# Patient Record
Sex: Female | Born: 1950 | Race: White | Hispanic: No | Smoking: Former smoker
Health system: Southern US, Community
[De-identification: ages and names within clinical notes are randomized; demographics above are authoritative.]

## PROBLEM LIST (undated history)

## (undated) DIAGNOSIS — M720 Palmar fascial fibromatosis [Dupuytren]: Secondary | ICD-10-CM

## (undated) DIAGNOSIS — F329 Major depressive disorder, single episode, unspecified: Secondary | ICD-10-CM

## (undated) DIAGNOSIS — G4700 Insomnia, unspecified: Secondary | ICD-10-CM

## (undated) DIAGNOSIS — M810 Age-related osteoporosis without current pathological fracture: Secondary | ICD-10-CM

## (undated) DIAGNOSIS — R55 Syncope and collapse: Secondary | ICD-10-CM

## (undated) DIAGNOSIS — J301 Allergic rhinitis due to pollen: Secondary | ICD-10-CM

## (undated) DIAGNOSIS — R42 Dizziness and giddiness: Secondary | ICD-10-CM

## (undated) DIAGNOSIS — T4145XA Adverse effect of unspecified anesthetic, initial encounter: Secondary | ICD-10-CM

## (undated) DIAGNOSIS — K219 Gastro-esophageal reflux disease without esophagitis: Secondary | ICD-10-CM

## (undated) DIAGNOSIS — D126 Benign neoplasm of colon, unspecified: Secondary | ICD-10-CM

## (undated) DIAGNOSIS — R002 Palpitations: Secondary | ICD-10-CM

## (undated) DIAGNOSIS — F32A Depression, unspecified: Secondary | ICD-10-CM

## (undated) DIAGNOSIS — M79609 Pain in unspecified limb: Secondary | ICD-10-CM

## (undated) DIAGNOSIS — E559 Vitamin D deficiency, unspecified: Secondary | ICD-10-CM

## (undated) DIAGNOSIS — M8580 Other specified disorders of bone density and structure, unspecified site: Secondary | ICD-10-CM

## (undated) DIAGNOSIS — F419 Anxiety disorder, unspecified: Secondary | ICD-10-CM

## (undated) DIAGNOSIS — R5383 Other fatigue: Secondary | ICD-10-CM

## (undated) DIAGNOSIS — F411 Generalized anxiety disorder: Secondary | ICD-10-CM

## (undated) DIAGNOSIS — T8859XA Other complications of anesthesia, initial encounter: Secondary | ICD-10-CM

## (undated) DIAGNOSIS — I499 Cardiac arrhythmia, unspecified: Secondary | ICD-10-CM

## (undated) DIAGNOSIS — M1990 Unspecified osteoarthritis, unspecified site: Secondary | ICD-10-CM

## (undated) DIAGNOSIS — R51 Headache: Secondary | ICD-10-CM

## (undated) DIAGNOSIS — R112 Nausea with vomiting, unspecified: Secondary | ICD-10-CM

## (undated) DIAGNOSIS — C439 Malignant melanoma of skin, unspecified: Secondary | ICD-10-CM

## (undated) DIAGNOSIS — Z853 Personal history of malignant neoplasm of breast: Secondary | ICD-10-CM

## (undated) DIAGNOSIS — I959 Hypotension, unspecified: Secondary | ICD-10-CM

## (undated) DIAGNOSIS — S0011XA Contusion of right eyelid and periocular area, initial encounter: Secondary | ICD-10-CM

## (undated) DIAGNOSIS — Z9889 Other specified postprocedural states: Secondary | ICD-10-CM

## (undated) HISTORY — DX: Benign neoplasm of colon, unspecified: D12.6

## (undated) HISTORY — DX: Pain in unspecified limb: M79.609

## (undated) HISTORY — PX: BREAST EXCISIONAL BIOPSY: SUR124

## (undated) HISTORY — DX: Personal history of malignant neoplasm of breast: Z85.3

## (undated) HISTORY — PX: TOTAL ABDOMINAL HYSTERECTOMY W/ BILATERAL SALPINGOOPHORECTOMY: SHX83

## (undated) HISTORY — DX: Age-related osteoporosis without current pathological fracture: M81.0

## (undated) HISTORY — PX: BREAST BIOPSY: SHX20

## (undated) HISTORY — DX: Vitamin D deficiency, unspecified: E55.9

## (undated) HISTORY — DX: Unspecified osteoarthritis, unspecified site: M19.90

## (undated) HISTORY — DX: Palpitations: R00.2

## (undated) HISTORY — DX: Other fatigue: R53.83

## (undated) HISTORY — PX: CERVICAL LAMINECTOMY: SHX94

## (undated) HISTORY — DX: Insomnia, unspecified: G47.00

## (undated) HISTORY — DX: Palmar fascial fibromatosis (dupuytren): M72.0

## (undated) HISTORY — DX: Allergic rhinitis due to pollen: J30.1

## (undated) HISTORY — DX: Contusion of right eyelid and periocular area, initial encounter: S00.11XA

## (undated) HISTORY — PX: OTHER SURGICAL HISTORY: SHX169

## (undated) HISTORY — DX: Major depressive disorder, single episode, unspecified: F32.9

## (undated) HISTORY — DX: Dizziness and giddiness: R42

## (undated) HISTORY — DX: Generalized anxiety disorder: F41.1

## (undated) HISTORY — DX: Malignant melanoma of skin, unspecified: C43.9

## (undated) HISTORY — DX: Other specified disorders of bone density and structure, unspecified site: M85.80

## (undated) HISTORY — PX: TONSILLECTOMY: SUR1361

---

## 2001-07-05 ENCOUNTER — Ambulatory Visit (HOSPITAL_COMMUNITY): Admission: RE | Admit: 2001-07-05 | Discharge: 2001-07-05 | Payer: Self-pay | Admitting: Pulmonary Disease

## 2001-07-12 ENCOUNTER — Ambulatory Visit (HOSPITAL_COMMUNITY): Admission: RE | Admit: 2001-07-12 | Discharge: 2001-07-12 | Payer: Self-pay | Admitting: Pulmonary Disease

## 2002-03-31 ENCOUNTER — Ambulatory Visit (HOSPITAL_COMMUNITY): Admission: RE | Admit: 2002-03-31 | Discharge: 2002-03-31 | Payer: Self-pay | Admitting: Pulmonary Disease

## 2002-10-22 ENCOUNTER — Inpatient Hospital Stay (HOSPITAL_COMMUNITY): Admission: EM | Admit: 2002-10-22 | Discharge: 2002-10-24 | Payer: Self-pay

## 2002-10-22 ENCOUNTER — Encounter: Payer: Self-pay | Admitting: Neurosurgery

## 2002-10-23 ENCOUNTER — Encounter: Payer: Self-pay | Admitting: Neurosurgery

## 2003-08-28 ENCOUNTER — Ambulatory Visit (HOSPITAL_COMMUNITY): Admission: RE | Admit: 2003-08-28 | Discharge: 2003-08-28 | Payer: Self-pay | Admitting: Obstetrics & Gynecology

## 2004-12-05 ENCOUNTER — Encounter: Admission: RE | Admit: 2004-12-05 | Discharge: 2004-12-05 | Payer: Self-pay | Admitting: Pulmonary Disease

## 2006-01-09 ENCOUNTER — Encounter: Admission: RE | Admit: 2006-01-09 | Discharge: 2006-01-09 | Payer: Self-pay | Admitting: Pulmonary Disease

## 2006-01-27 ENCOUNTER — Encounter: Admission: RE | Admit: 2006-01-27 | Discharge: 2006-01-27 | Payer: Self-pay | Admitting: Pulmonary Disease

## 2006-05-06 ENCOUNTER — Ambulatory Visit (HOSPITAL_COMMUNITY): Admission: RE | Admit: 2006-05-06 | Discharge: 2006-05-06 | Payer: Self-pay | Admitting: Pulmonary Disease

## 2006-11-20 ENCOUNTER — Ambulatory Visit (HOSPITAL_COMMUNITY): Admission: RE | Admit: 2006-11-20 | Discharge: 2006-11-21 | Payer: Self-pay | Admitting: Neurosurgery

## 2007-02-04 ENCOUNTER — Encounter: Admission: RE | Admit: 2007-02-04 | Discharge: 2007-02-04 | Payer: Self-pay | Admitting: Obstetrics and Gynecology

## 2007-05-07 ENCOUNTER — Ambulatory Visit: Payer: Self-pay | Admitting: Gastroenterology

## 2007-05-07 ENCOUNTER — Encounter: Payer: Self-pay | Admitting: Gastroenterology

## 2007-05-07 ENCOUNTER — Ambulatory Visit (HOSPITAL_COMMUNITY): Admission: RE | Admit: 2007-05-07 | Discharge: 2007-05-07 | Payer: Self-pay | Admitting: Gastroenterology

## 2007-10-25 ENCOUNTER — Emergency Department (HOSPITAL_COMMUNITY): Admission: EM | Admit: 2007-10-25 | Discharge: 2007-10-25 | Payer: Self-pay | Admitting: Emergency Medicine

## 2008-03-17 ENCOUNTER — Encounter: Admission: RE | Admit: 2008-03-17 | Discharge: 2008-03-17 | Payer: Self-pay | Admitting: Obstetrics and Gynecology

## 2008-04-19 IMAGING — CR DG WRIST COMPLETE 3+V*L*
3 series · 3 of 3 positions shown · non-contrast
Comparison: none

CLINICAL DATA: Status post fall with pain in wrist.
 LEFT WRIST ? 4 VIEWS:

[view not recorded (1 of 3)]
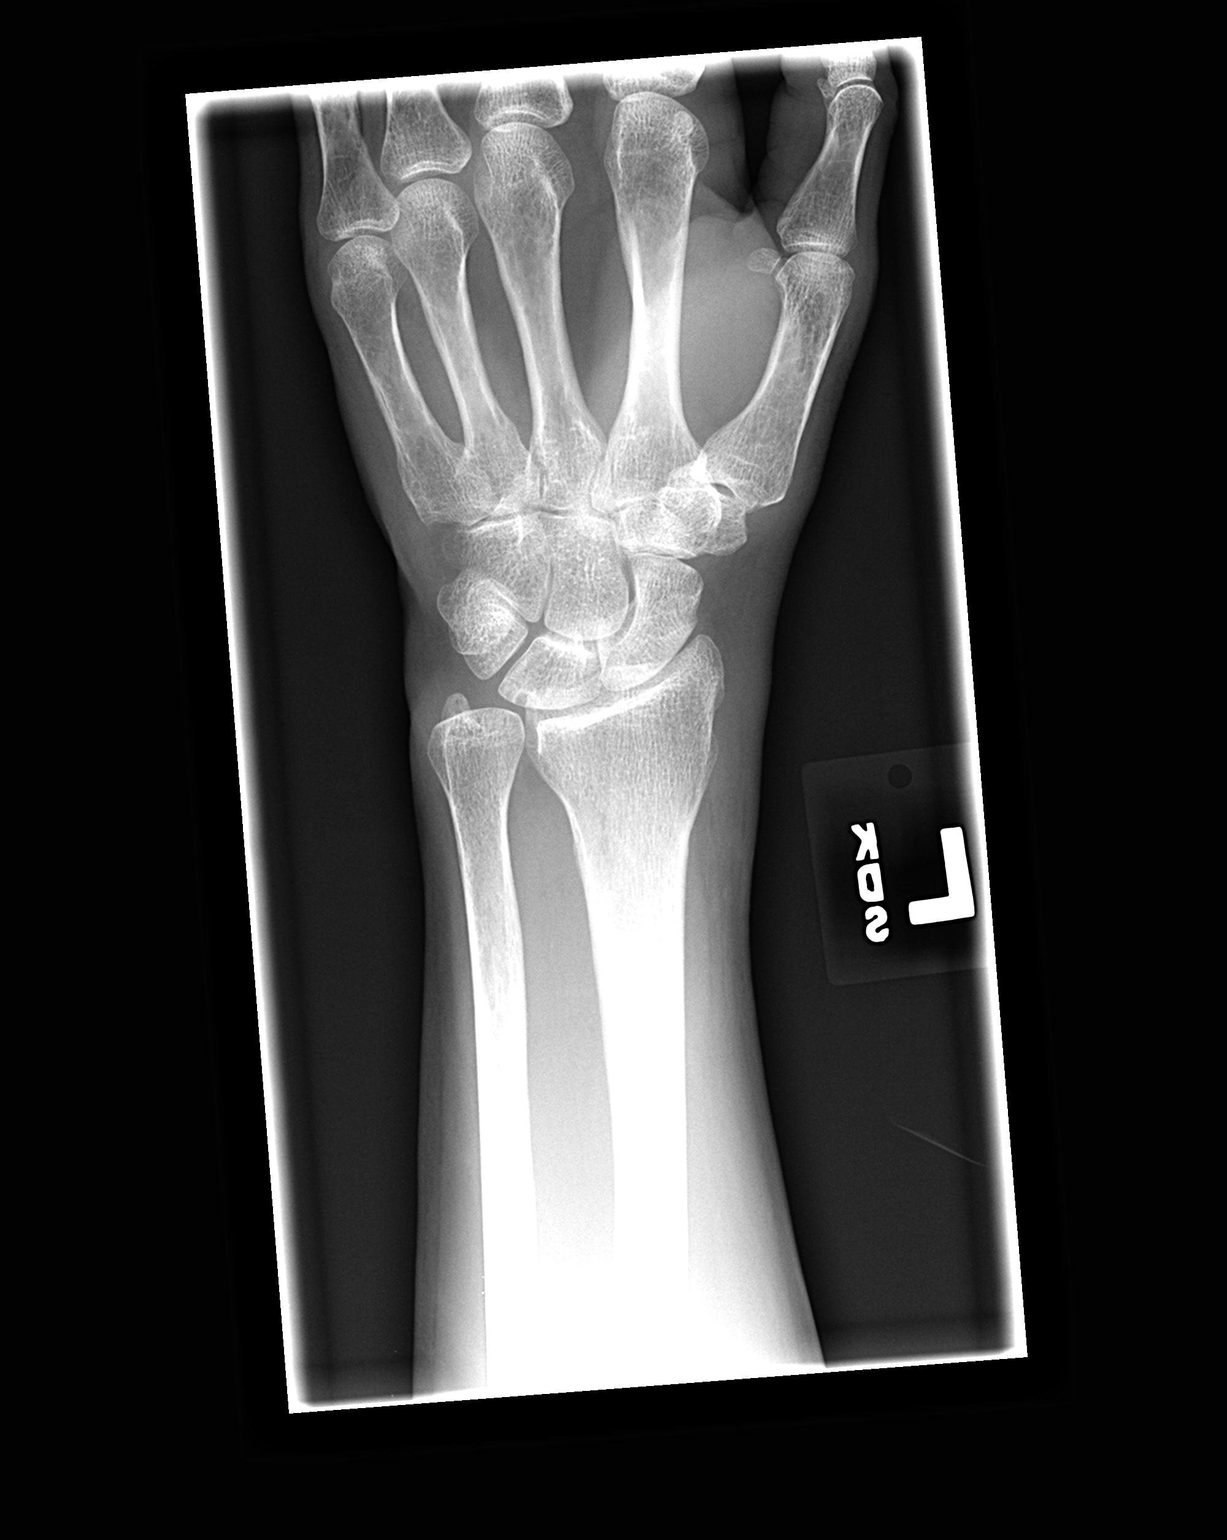

[view not recorded (2 of 3)]
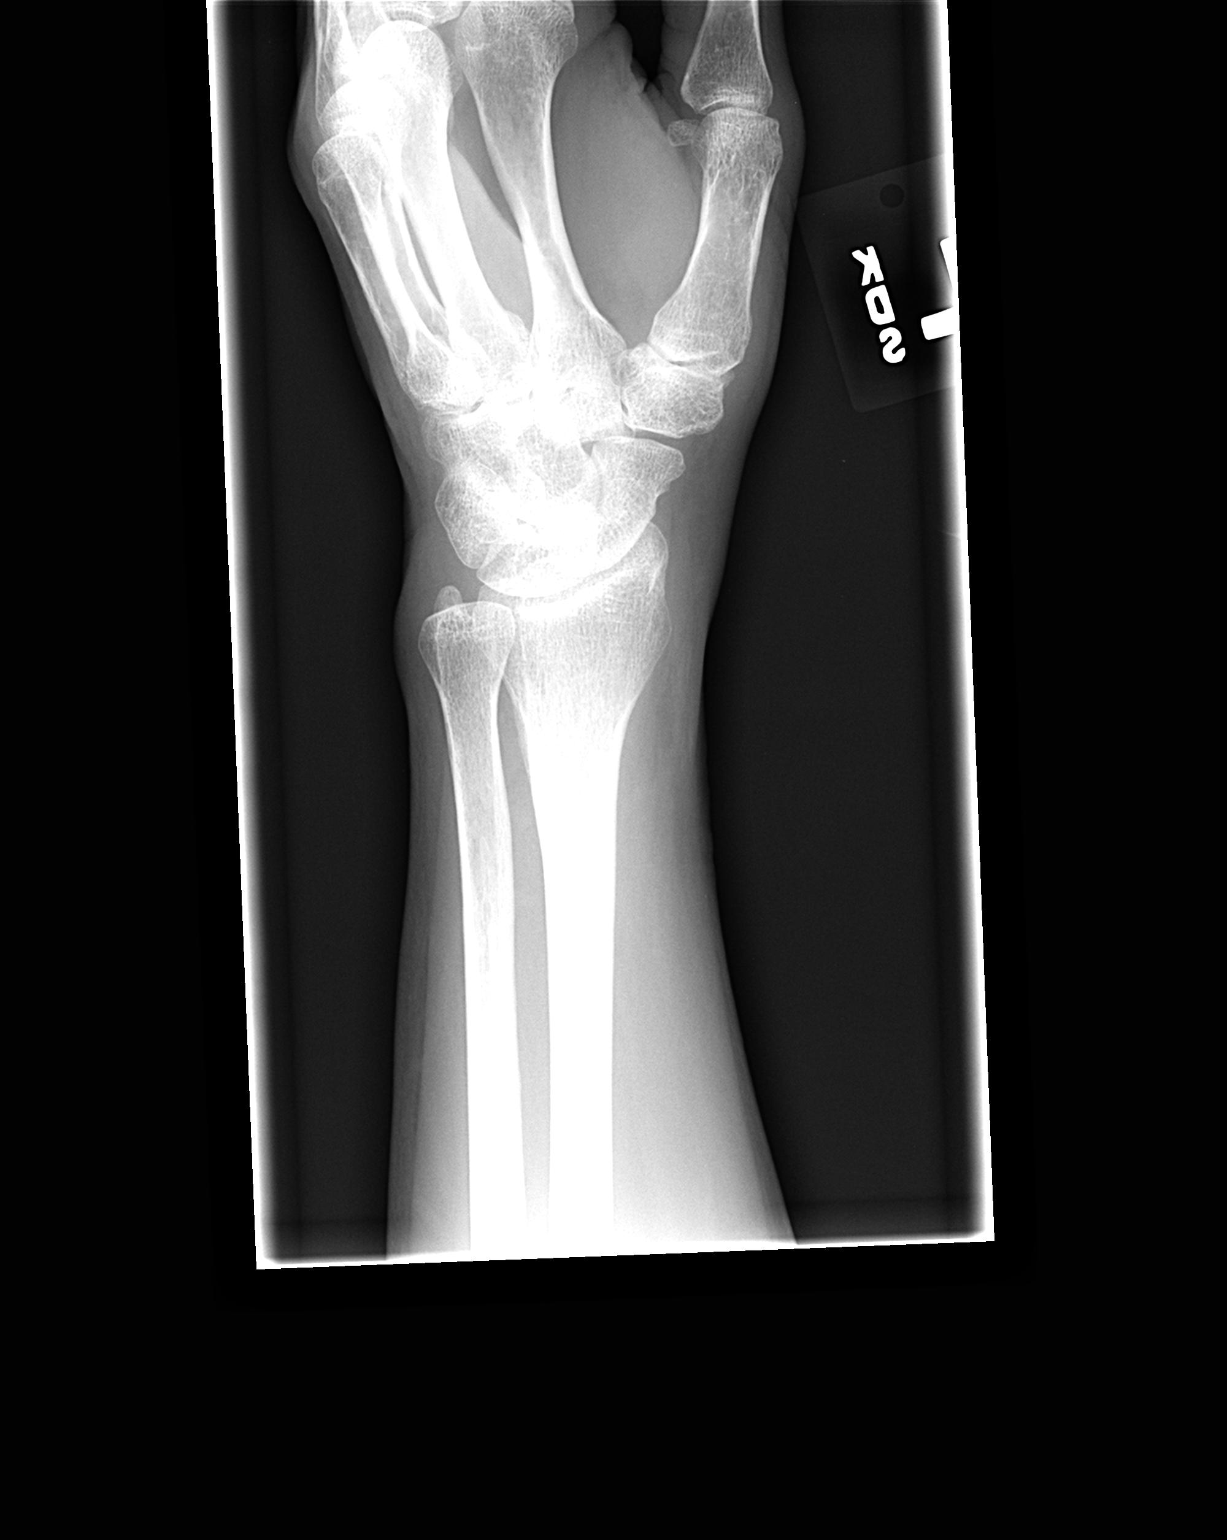

[view not recorded (3 of 3)]
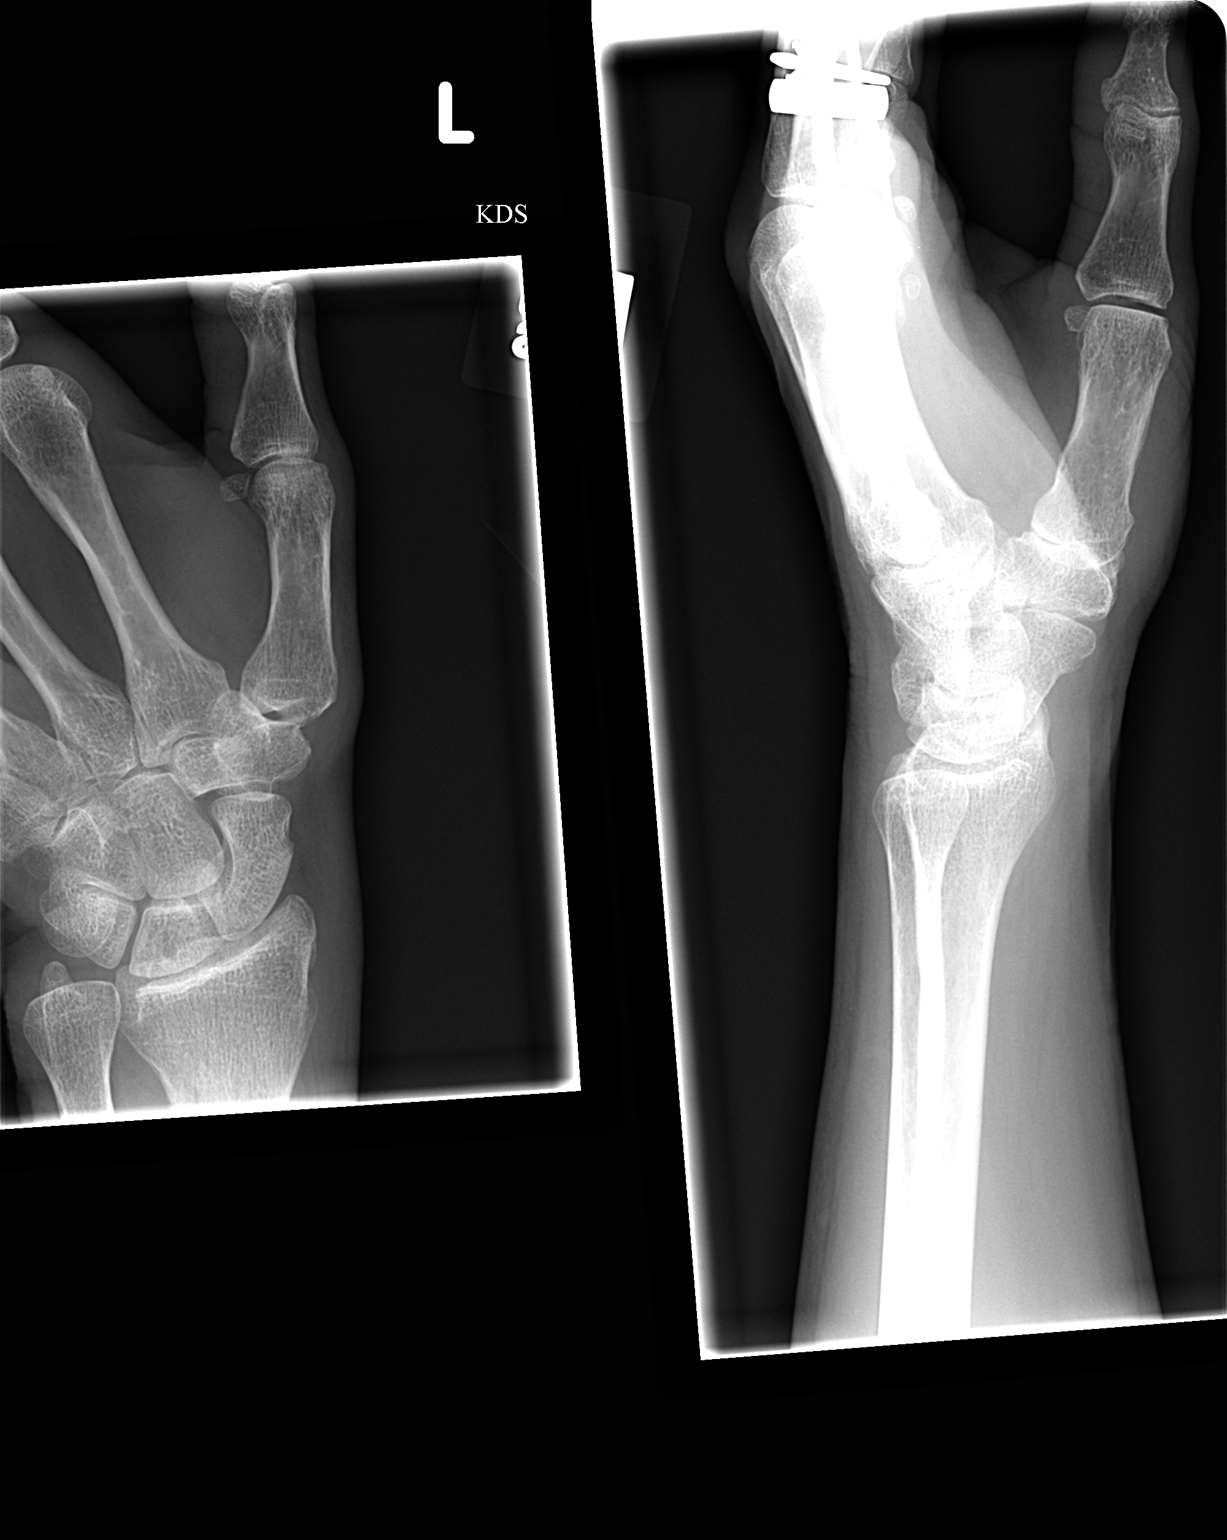

[3 of 3 positions shown; findings below may reference images not displayed]

FINDINGS: No acute fracture.  Mild soft tissue swelling.  Mild degenerative changes are seen in the wrist.
IMPRESSION: Soft tissue swelling without underlying fracture.

## 2008-09-29 ENCOUNTER — Ambulatory Visit (HOSPITAL_COMMUNITY): Admission: RE | Admit: 2008-09-29 | Discharge: 2008-09-29 | Payer: Self-pay | Admitting: Internal Medicine

## 2009-07-09 ENCOUNTER — Encounter: Admission: RE | Admit: 2009-07-09 | Discharge: 2009-07-09 | Payer: Self-pay | Admitting: Internal Medicine

## 2010-06-11 ENCOUNTER — Ambulatory Visit: Payer: Self-pay | Admitting: Cardiology

## 2010-07-12 ENCOUNTER — Encounter: Admission: RE | Admit: 2010-07-12 | Discharge: 2010-07-12 | Payer: Self-pay | Admitting: Obstetrics & Gynecology

## 2010-10-07 ENCOUNTER — Ambulatory Visit: Payer: Self-pay | Admitting: Cardiology

## 2011-03-04 NOTE — Op Note (Signed)
NAMEARMILDA, Michelle Martinez           ACCOUNT NO.:  [id number]   MEDICAL RECORD NO.:  [id number]          Martinez TYPE:  AMB   LOCATION:  DAY                           FACILITY:  [facility]   PHYSICIAN:  Kassie Mends, M.D.      DATE OF BIRTH:  Mar 09, 1951   DATE OF PROCEDURE:  05/07/2007  DATE OF DISCHARGE:                               OPERATIVE REPORT   REFERRING PHYSICIAN:  Shaune Pollack.   PROCEDURE:  Colonoscopy with cold-forceps polypectomy.   INDICATION FOR EXAMINATION:  Michelle Martinez is a 60 year old female who  presents for average-risk colon cancer screening.   FINDINGS:  1. A 4-mm sessile rectal polyp removed via cold forceps.  Otherwise,      no masses, inflammatory changes, diverticular or AVMs seen.  2. No polyps or internal hemorrhoids seen on retroflexed view of Michelle      rectum.   RECOMMENDATIONS:  1. Michelle Martinez should follow a high-fiber diet.  2. Will call her with results of her biopsy.  If her polyp is      adenomatous, then she should have a screening colonoscopy in 5      years, and her siblings and children should have a colonoscopy at      Michelle age of 92 and then every 5 years.  3. No aspirin or NSAIDs or anticoagulation for 3 days.  4. She is given handout on high-fiber diet, polyps and hemorrhoids.   MEDICATIONS:  1. Demerol 100 mg IV.  2. Versed 4 mg IV.   PROCEDURE TECHNIQUE:  Physical exam was performed, and informed consent  was obtained from Michelle Martinez after explaining Michelle benefits, risks and  alternatives to Michelle procedure.  Michelle Martinez was connected to monitor and  placed in Michelle left lateral position.  Continuous oxygen was provided by  nasal cannula and IV medicine administered through an indwelling  cannula.  After administration of sedation and rectal exam, Michelle scope  was advanced under  direct visualization to Michelle cecum.  Michelle scope was removed slowly by  carefully examining Michelle color, texture, anatomy and integrity of Michelle  mucosa on Michelle way out.  Michelle  Martinez was recovered in endoscopy and  discharged home in satisfactory condition.      Kassie Mends, M.D.  Electronically Signed     SM/MEDQ  D:  05/07/2007  T:  05/07/2007  Job:  [id number]   cc:   Ramon Dredge L. Juanetta Gosling, M.D.  Fax: [phone]

## 2011-03-07 NOTE — Op Note (Signed)
NAMEPRISMA, DECARLO           ACCOUNT NO.:  [id number]   MEDICAL RECORD NO.:  [id number]          PATIENT TYPE:  OIB   LOCATION:  3172                         FACILITY:  [facility]   PHYSICIAN:  Danae Orleans. Venetia Maxon, M.D.  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   PROCEDURE:  Left L3-4 microdiskectomy with microdissection.   SURGEON:  Danae Orleans. Venetia Maxon, M.D.   ASSISTANTS:  1. Stefani Dama, M.D.  2. Georgiann Cocker, RN   ANESTHESIA:  General endotracheal anesthesia.   ESTIMATED BLOOD LOSS:  500 cc.   COMPLICATIONS:  None.   DISPOSITION:  To recovery.   INDICATIONS:  Michelle Martinez is a 60 year old woman with a foraminal  left L3-4 disc herniation with left L3 radiculopathy.  She is in severe  pain.  It was elected to take her surgery for microdiskectomy.   PROCEDURE:  Ms. Olvera was brought to the operating room.  Following  satisfactory uncomplicated induction of general endotracheal anesthesia  and placement of intravenous lines, the patient was placed in the prone  position on the Wilson frame.  After prepping and draping in the usual  fashion, the area of planned incision was infiltrated with 0.25%  Marcaine and 0.5% lidocaine with 1:200,000 epinephrine.  Incision was  made in the midline overlying the L3-4 interspace and carried sharply  through to the lumbodorsal fascia, which was incised on the left side of  the midline.  Subperiosteal dissection was performed, exposing the L3-4  level.  A self-retaining retractor was placed.  Intraoperative x-ray  confirmed marker probe at L3-4 and up to the L3 pedicle.  Hemisemilaminectomy of L3 was  performed with a high-speed drill and  completed with Kerrison rongeurs, exposing the L3-4 interspace.  The  ligamentum flavum was detached and removed in a piecemeal fashion.  Spinal canal dura was then decompressed, and the lateral recess was  decompressed.  The microscope was brought into field, and using  microdissection technique the lateral aspect of the thecal sac was  mobilized, exposing the L3 nerve root as it extended out to the neural  foramen, and just under the nerve root there was a significant amount of  free disc material causing significant nerve root compression.  The  nerve root was decompressed, and multiple fragments of disc material  were removed with the micropituitary. The foraminal region and  preforaminal region were then decompressed and palpated with a variety  of lengths of ball-tip probes. Subsequently, it was felt that the nerve  root was well decompressed, as was the thecal sac.  Hemostasis was  ensured with Gelfoam soaked in thrombin, and subsequently the operative  site was bathed in 2 cc of fentanyl and Depo-Medrol. The self-retaining  retractor was removed.  Microscope was taken out of the field.  The  lumbodorsal fascia was closed with 0  Vicryl suture.  Subcutaneous  tissues were reapproximated with 2-0 Vicryl interrupted inverted  sutures, and skin edges were approximated with interrupted 3-0 Vicryl  subcuticular stitches.  The wound was dressed with Dermabond.  The  patient was extubated in the operating room and taken to the recovery  room in stable satisfactory condition, having tolerated the operation  well.  Counts were correct at the end of the case.      Danae Orleans. Venetia Maxon, M.D.  Electronically Signed     JDS/MEDQ  D:  11/20/2006  T:  11/20/2006  Job:  [id number]

## 2011-03-07 NOTE — H&P (Signed)
NAME:  Michelle Martinez, Michelle Martinez                     ACCOUNT NO.:  [id number]   MEDICAL RECORD NO.:  [id number]                   PATIENT TYPE:  INP   LOCATION:  1831                                 FACILITY:  [facility]   PHYSICIAN:  Danae Orleans. Venetia Maxon, M.D.               DATE OF BIRTH:  Apr 06, 1951   DATE OF ADMISSION:  10/22/2002  DATE OF DISCHARGE:                                HISTORY & PHYSICAL   REASON FOR ADMISSION:  Bilateral arm weakness, numbness, and neck pain.   HISTORY OF PRESENT ILLNESS:  The patient is a 60 year old right-handed woman  with approximately a one-week history of neck and bilateral upper extremity  pain, numbness, and weakness that has been progressing.  She had an MRI of  her cervical spine today and was instructed to come to the emergency room  after that was performed. This showed a large herniated disk at C5-6 with  severe spinal cord compression with degenerative disk disease and  spondylosis to a much lesser degree at the C3-4 and C4-5 level.  The patient  complains of severe pain and is concerned about a progressive weakness.  She  denies any bowel or bladder dysfunction.   PAST MEDICAL HISTORY:  1. Osteopenia.  She was on Fosamax but could not tolerate that.  She is     currently taking Caltrate and vitamin D.  2. Lupus.  3. History of two C section deliveries.  4. Hysterectomy.  5. History of pneumonia x 4 in the past.   ALLERGIES:  SULFA.   MEDICATIONS:  1. Arthrotec 75 mg b.i.d.  2. Flexeril 10 mg q.h.s.  3. Atenolol 25 mg p.o. q.d.  4. Paxil 10 mg p.o. q.d.   PHYSICAL EXAMINATION:  GENERAL: Awake, alert, and conversant, speaks with  clear and fluent speech, has intact short and long-term memory.  VITAL SIGNS:  Temperature 99.3, pulse 74, respiratory rate 20, blood  pressure 129/64.  NEUROLOGIC:  She has a stiff neck with decreased range of motion and  perivertebral spasm, worse on the left than the right.  She has a positive  Spurling maneuver  to the left with reproducible radicular pain and numbness  turning her head to the left, negative to the right.  Positive Lhermitte's  sign with axial compression.  Cranial nerves II-XII intact.  Motor  examination intact to upper and lower extremities with the exception of 4-/5  left biceps strength, 4-/5 bilateral triceps strength, 4-/5 left wrist  extension, 4-/5 bilateral wrist flexion, 4-/5 left hand intrinsics and  finger extension, and 4/5 right hand intrinsics and finger extension.  She  notes a decreased pin sensation in the left greater than right arm including  all of her digits and over the right side of her back.  Reflexes are brisk  throughout with bilateral hyperreflexia.  Biceps and triceps reflexes are 3  on the right, 3+ on the left.  Positive Hoffmann's signs bilaterally, 3+  at  the knees with bilaterally positive crossed adductors and suprapatellar  reflexes, 3 at the ankles.  Toes downgoing to plantar stimulation.  CHEST:  Clear to auscultation.  HEART:  Regular rate and rhythm without murmur.  ABDOMEN:  Soft, nontender, active bowel sounds.  No hepatosplenomegaly.  NECK:  No evidence of carotid bruits or neck masses.  EXTREMITIES:  Feet are cool with mild mottling and brisk capillary refill  with intact pedal pulses.   IMPRESSION:  1. C5-6 large herniated cervical disk with significant cervical spinal cord     compression.  2. History of osteopenia.  3. Lupus.  4. Mild degenerative disk disease and spondylosis at C3-4 and C4-5 levels.   PLAN:  Because of the severity of the spinal cord compression, relatively  rapid progression of symptoms, recommend she undergo anterior cervical  decompression and fusion at the C5-6 level.  I explained that, while the  other levels are affected, there are not nearly as severely affected, and  she does not have the neurological referable to those other levels.   I am admitting her to the hospital and placing her on preoperative  Decadron  to decrease her spinal cord edema with the plan of operative intervention  consisting of anterior cervical decompression and fusion at the C5-6 level  on 10/23/2002.  The patient and her family were informed of potential risks of  surgery to include bleeding, infection, damage to nerves and vessels,  weakness, paralysis, injury to the spinal cord, injury to her esophagus,  damage to recurrent laryngeal nerves with resultant hoarseness of voice, and  problems related to either further degenerative changes at other levels of  her neck and also to potential to nonunion or failure of fusion given her  premorbid osteopenia and connective tissue disorder.  She wishes to proceed  with surgery, and this has been planned for 10/23/2002.                                               Danae Orleans. Venetia Maxon, M.D.    JDS/MEDQ  D:  10/22/2002  T:  10/22/2002  Job:  [id number]

## 2011-03-07 NOTE — Op Note (Signed)
NAME:  Michelle Martinez, Minor                       ACCOUNT NO.:  [id number]   MEDICAL RECORD NO.:  [id number]                   PATIENT TYPE:   LOCATION:                                       FACILITY:   PHYSICIAN:  Danae Orleans. Venetia Maxon, M.D.               DATE OF BIRTH:  02-19-1951   DATE OF PROCEDURE:  10/23/2002  DATE OF DISCHARGE:  10/24/2002                                 OPERATIVE REPORT   PREOPERATIVE DIAGNOSIS:  Herniated cervical disk with cervical myelopathy,  cervical spondylosis with cervical myelopathy, cervical stenosis and  cervical radiculopathy at C5-6.   POSTOPERATIVE DIAGNOSIS:  Herniated cervical disk with cervical myelopathy,  cervical spondylosis with cervical myelopathy, cervical stenosis and  cervical radiculopathy at C5-6.   PROCEDURE:  Anterior cervical decompression and fusion, C5-6 with Allograft  bone graft and anterior cervical plate.   SURGEON:  Danae Orleans. Venetia Maxon, M.D.   ANESTHESIA:  General endotracheal anesthesia.   ESTIMATED BLOOD LOSS:  Minimal.   COMPLICATIONS:  None.   DISPOSITION:  Recovery room.   INDICATIONS FOR PROCEDURE:  The patient is a 60 year-old woman with an acute  cervical disk rupture with severe spinal cord compression of the C5-6 level  with markedly and rapidly progressive cervical myelopathy who was seen in  the emergency room on October 22, 2002 with severe neck pain and bilateral  upper extremity weakness.  She was admitted and started on preoperative  Decadron and was taken to surgery today for an emergent anterior cervical  decompression and fusion at the C5-6 level.   DESCRIPTION OF PROCEDURE:  The patient was brought to the operating room.  Following the successful uncomplicated induction of general endotracheal  anesthesia with maintenance of her neck in neutral alignment, she was placed  in five pounds of anterior Holter traction. Her anterior neck was then  prepped and draped in the usual sterile fashion.  A transverse  incision was  made in one of the middle neck creases overlying the C5-6 interspace and  carried through the platysmal layer.  Subplatysmal dissection was performed  and then blunt dissection was performed keep the carotid sheath lateral, the  trachea and esophagus medial exposing what was felt to be the C5-6 level.  An intraoperative x-ray was obtained with the spinal needle at what was felt  to be the C5-6 level and this was confirmed on x-ray.  Subsequently, the  longus coli muscles were taken down from the anterior cervical spine from  the C5 through C6 levels bilaterally.  A Shadowline self retaining retractor  was placed to facilitate exposure and the disk space was incised with a 15  blade.  Disk material was removed in a piecemeal fashion and the plates were  stripped of residual disk material.  A disk space spreader was placed and  the microscope was brought onto the field.  Under high microscopic  visualization a large central to left sided fragment  of disk material was  removed.  Subsequently, the uncinate spurs and posterior longitudinal  ligament were then incised and removed as well.  Subsequently, a very large  fragment of disk material was identified deep to the posterior longitudinal  ligament in the midline and this was removed resulting in decompression of  the cervical spinal cord dura.  Along the left neural foramen there were  additional several smaller but still good sized additional fragments of disk  material which were removed and this resulted in decompression of the C6  nerve root and the spinal cord.  The right side was not as severely  compressed and the lateral aspect of the spinal was decompressed as well.  This resulted in good decompression of the cervical spinal cord dura and a  considerable amount of disk material was removed.  There did not appear to  be a marked amount of spondylosis; almost all of the decompression was from  soft disk material.   Hemostasis was then assured with Gelfoam soaked in  Thrombin.  A 7 mm patellar Allograft bone graft wedge was then inserted into  the interspace and counter sunk appropriately.  A 22 mm Treonic slim line  anterior cervical plate was then affixed to the anterior cervical spine  after Holter traction was removed and the microscope was taken out of the  field.  Twelve mm variable angled screws were used, two at C5 and two at C6.  All screws had excellent purchase.  The locking mechanisms were engaged.  Final x-ray confirmed positioning of the bone graft and the anterior  cervical plate.  The wound was then irrigated with Bacitracin saline and  hemostasis was assured.  The soft tissues were inspected and found to be in  good repair.  The platysmal layer was then closed with 3-0 Vicryl sutures.  The skin edges were reapproximated with running 4-0 Vicryl subcuticular  stitch.  The wound was dressed with Dermabond.  The patient was extubated in  the operating room and taken to the recovery room in stable and satisfactory  condition.  She tolerated her operation well.  Counts were correct at the  end of the case.                                               Danae Orleans. Venetia Maxon, M.D.    JDS/MEDQ  D:  10/23/2002  T:  10/23/2002  Job:  [id number]

## 2011-03-24 ENCOUNTER — Other Ambulatory Visit: Payer: Self-pay | Admitting: *Deleted

## 2011-03-24 DIAGNOSIS — E785 Hyperlipidemia, unspecified: Secondary | ICD-10-CM

## 2011-03-25 ENCOUNTER — Encounter: Payer: Self-pay | Admitting: Cardiology

## 2011-03-25 ENCOUNTER — Ambulatory Visit (INDEPENDENT_AMBULATORY_CARE_PROVIDER_SITE_OTHER): Payer: BC Managed Care – PPO | Admitting: Cardiology

## 2011-03-25 ENCOUNTER — Other Ambulatory Visit (INDEPENDENT_AMBULATORY_CARE_PROVIDER_SITE_OTHER): Payer: BC Managed Care – PPO | Admitting: *Deleted

## 2011-03-25 VITALS — BP 90/70 | HR 66 | Wt 124.0 lb

## 2011-03-25 DIAGNOSIS — E785 Hyperlipidemia, unspecified: Secondary | ICD-10-CM

## 2011-03-25 DIAGNOSIS — E7800 Pure hypercholesterolemia, unspecified: Secondary | ICD-10-CM

## 2011-03-25 HISTORY — DX: Pure hypercholesterolemia, unspecified: E78.00

## 2011-03-25 LAB — HEPATIC FUNCTION PANEL
ALT: 35 U/L (ref 0–35)
AST: 26 U/L (ref 0–37)
Albumin: 4.1 g/dL (ref 3.5–5.2)
Alkaline Phosphatase: 59 U/L (ref 39–117)
Bilirubin, Direct: 0.1 mg/dL (ref 0.0–0.3)
Total Bilirubin: 0.3 mg/dL (ref 0.3–1.2)
Total Protein: 6.3 g/dL (ref 6.0–8.3)

## 2011-03-25 LAB — BASIC METABOLIC PANEL
BUN: 20 mg/dL (ref 6–23)
CO2: 29 mEq/L (ref 19–32)
Calcium: 9.4 mg/dL (ref 8.4–10.5)
Chloride: 109 mEq/L (ref 96–112)
Creatinine, Ser: 0.7 mg/dL (ref 0.4–1.2)
GFR: 95.84 mL/min (ref >60.00)
Glucose, Bld: 93 mg/dL (ref 70–99)
Potassium: 4 mEq/L (ref 3.5–5.1)
Sodium: 142 mEq/L (ref 135–145)

## 2011-03-25 LAB — LIPID PANEL
Cholesterol: 180 mg/dL (ref 0–200)
HDL: 71.6 mg/dL (ref >39.00)
LDL Cholesterol: 97 mg/dL (ref 0–99)
Total CHOL/HDL Ratio: 3
Triglycerides: 58 mg/dL (ref 0.0–149.0)
VLDL: 11.6 mg/dL (ref 0.0–40.0)

## 2011-03-25 NOTE — Progress Notes (Signed)
Michelle Martinez Date of Birth:  April 01, 1951 [facility] [address]   [address] [place], [place]  [address] [phone]           Fax   [phone]  HPI: This pleasant 60 year old woman is seen for a scheduled six-month followup office visit.  He is a past history of orthostatic hypotension and syncope.  She has had no recent symptoms in that regard.  She has a history of familial hypercholesterolemia.  We are checking blood work today.  She has lost 7 pounds and is exercising more and has changed her diet to decrease her carbohydrates.  Current Outpatient Prescriptions  Medication Sig Dispense Refill  . ARTHROTEC 75 75-200 MG-MCG per tablet Take 1 tablet by mouth Twice daily.      Marland Kitchen atenolol (TENORMIN) 25 MG tablet Take 1 tablet by mouth Daily.      . CRESTOR 20 MG tablet Take 1 tablet by mouth Daily.      . cyclobenzaprine (FLEXERIL) 10 MG tablet Take 10 mg by mouth at bedtime.        . CYMBALTA 60 MG capsule Take 1 tablet by mouth Daily.      . multivitamin (THERAGRAN) per tablet Take 1 tablet by mouth daily.          Allergies  Allergen Reactions  . Sulfur     Patient Active Problem List  Diagnoses  . Hypercholesterolemia    History  Smoking status  . Not on file  Smokeless tobacco  . Not on file    History  Alcohol Use: Not on file    Family History  Problem Relation Age of Onset  . Cancer Mother   . Heart attack Father   . Cancer Father     Review of Systems: The patient denies any heat or cold intolerance.  No weight gain or weight loss.  The patient denies headaches or blurry vision.  There is no cough or sputum production.  The patient denies dizziness.  There is no hematuria or hematochezia.  The patient denies any muscle aches or arthritis.  The patient denies any rash.  The patient denies frequent falling or instability.  There is no history of depression or anxiety.  All other systems were reviewed and are  negative.   Physical Exam: Filed Vitals:   03/25/11 0916  BP: 90/70  Pulse: 66   General appearance reveals a well-developed well-nourished woman in no distress.Pupils equal and reactive.   Extraocular Movements are full.  There is no scleral icterus.  The mouth and pharynx are normal.  The neck is supple.  The carotids reveal no bruits.  The jugular venous pressure is normal.  The thyroid is not enlarged.  There is no lymphadenopathy.The chest is clear to percussion and auscultation. There are no rales or rhonchi. Expansion of the chest is symmetrical.The precordium is quiet.  The first heart sound is normal.  The second heart sound is physiologically split.  There is no murmur gallop rub or click.  There is no abnormal lift or heaveThe abdomen is soft and nontender. Bowel sounds are normal. The liver and spleen are not enlarged. There Are no abdominal masses. There are no bruits.The pedal pulses are good.  There is no phlebitis or edema.  There is no cyanosis or clubbing.Strength is normal and symmetrical in all extremities.  There is no lateralizing weakness.  There are no sensory deficits.   Assessment / Plan: Await results of lab  work.  Continue same regimen.  Recheck 6 months.

## 2011-03-25 NOTE — Assessment & Plan Note (Signed)
This pleasant 60 year old woman returns for a six-month followup office visit.  She has a past history of hypercholesterolemia.  She is presently on Crestor 20 mg daily.  She is not having any adverse reaction from the Crestor.  She's also been careful with her diet and her weight is down 7 pounds since last visit.  She has changed her diet and has cut way back on carbohydrates and she is also increased her walking exercise program.

## 2011-03-26 ENCOUNTER — Telehealth: Payer: Self-pay | Admitting: *Deleted

## 2011-03-26 NOTE — Telephone Encounter (Signed)
Notified of lab results. 

## 2011-03-26 NOTE — Telephone Encounter (Signed)
Message copied by Lorayne Bender on Wed Mar 26, 2011  3:57 PM ------      Message from: Cassell Clement      Created: Wed Mar 26, 2011  2:49 PM       Please report.  Liver tests are normal.  The LDL is 97 which is acceptable.  Continue on same dose of meds and continue careful diet

## 2011-06-18 ENCOUNTER — Other Ambulatory Visit: Payer: Self-pay | Admitting: *Deleted

## 2011-06-18 DIAGNOSIS — E785 Hyperlipidemia, unspecified: Secondary | ICD-10-CM

## 2011-06-18 MED ORDER — ROSUVASTATIN CALCIUM 20 MG PO TABS: 20.0000 mg | ORAL_TABLET | Freq: Every day | ORAL | Status: DC

## 2011-06-18 NOTE — Telephone Encounter (Signed)
Refilled meds per fax request.  

## 2011-07-09 ENCOUNTER — Other Ambulatory Visit: Payer: Self-pay | Admitting: *Deleted

## 2011-07-09 LAB — I-STAT 8, (EC8 V) (CONVERTED LAB)
BUN: 18
Bicarbonate: 26.7 — ABNORMAL HIGH
Chloride: 108
Glucose, Bld: 106 — ABNORMAL HIGH
HCT: 39
Hemoglobin: 13.3
Operator id: 234501
Potassium: 3.8
Sodium: 139
TCO2: 28
pCO2, Ven: 50.5 — ABNORMAL HIGH
pH, Ven: 7.331 — ABNORMAL HIGH

## 2011-07-09 LAB — POCT I-STAT CREATININE
Creatinine, Ser: 0.9
Operator id: 234501

## 2011-08-04 ENCOUNTER — Other Ambulatory Visit: Payer: Self-pay | Admitting: Internal Medicine

## 2011-08-04 DIAGNOSIS — Z1231 Encounter for screening mammogram for malignant neoplasm of breast: Secondary | ICD-10-CM

## 2011-08-29 ENCOUNTER — Ambulatory Visit
Admission: RE | Admit: 2011-08-29 | Discharge: 2011-08-29 | Disposition: A | Discharge status: Home / Self Care | Payer: BC Managed Care – PPO | Source: Ambulatory Visit | Attending: Internal Medicine | Admitting: Internal Medicine

## 2011-08-29 DIAGNOSIS — Z1231 Encounter for screening mammogram for malignant neoplasm of breast: Secondary | ICD-10-CM

## 2011-09-26 ENCOUNTER — Ambulatory Visit (INDEPENDENT_AMBULATORY_CARE_PROVIDER_SITE_OTHER): Payer: BC Managed Care – PPO | Admitting: *Deleted

## 2011-09-26 DIAGNOSIS — E7800 Pure hypercholesterolemia, unspecified: Secondary | ICD-10-CM

## 2011-09-26 LAB — LIPID PANEL
Cholesterol: 205 mg/dL — ABNORMAL HIGH (ref 0–200)
HDL: 71.5 mg/dL (ref >39.00)
Total CHOL/HDL Ratio: 3
Triglycerides: 71 mg/dL (ref 0.0–149.0)
VLDL: 14.2 mg/dL (ref 0.0–40.0)

## 2011-09-26 LAB — HEPATIC FUNCTION PANEL
ALT: 32 U/L (ref 0–35)
AST: 33 U/L (ref 0–37)
Albumin: 4.5 g/dL (ref 3.5–5.2)
Alkaline Phosphatase: 52 U/L (ref 39–117)
Bilirubin, Direct: 0 mg/dL (ref 0.0–0.3)
Total Bilirubin: 0.6 mg/dL (ref 0.3–1.2)
Total Protein: 6.9 g/dL (ref 6.0–8.3)

## 2011-09-26 LAB — BASIC METABOLIC PANEL
BUN: 19 mg/dL (ref 6–23)
CO2: 27 mEq/L (ref 19–32)
Calcium: 9.3 mg/dL (ref 8.4–10.5)
Chloride: 105 mEq/L (ref 96–112)
Creatinine, Ser: 0.8 mg/dL (ref 0.4–1.2)
GFR: 81.48 mL/min (ref >60.00)
Glucose, Bld: 93 mg/dL (ref 70–99)
Potassium: 4.4 mEq/L (ref 3.5–5.1)
Sodium: 141 mEq/L (ref 135–145)

## 2011-09-26 LAB — LDL CHOLESTEROL, DIRECT: Direct LDL: 123.6 mg/dL

## 2011-09-30 ENCOUNTER — Other Ambulatory Visit: Payer: Self-pay | Admitting: Dermatology

## 2011-10-01 ENCOUNTER — Ambulatory Visit (INDEPENDENT_AMBULATORY_CARE_PROVIDER_SITE_OTHER): Payer: BC Managed Care – PPO | Admitting: Cardiology

## 2011-10-01 ENCOUNTER — Encounter: Payer: Self-pay | Admitting: Cardiology

## 2011-10-01 VITALS — BP 100/70 | HR 80 | Ht 62.0 in | Wt 124.0 lb

## 2011-10-01 DIAGNOSIS — E7800 Pure hypercholesterolemia, unspecified: Secondary | ICD-10-CM

## 2011-10-01 DIAGNOSIS — R002 Palpitations: Secondary | ICD-10-CM

## 2011-10-01 NOTE — Assessment & Plan Note (Signed)
The patient is on Crestor 20 mg daily for her familial hypercholesterolemia.  She is also watching her diet carefully.  Her recent LDL was 123.  She has not had any symptoms of chest pain or angina or ischemic heart disease.  There is no history of claudication or TIAs.

## 2011-10-01 NOTE — Patient Instructions (Addendum)
Your physician recommends that you continue on your current medications as directed. Please refer to the Current Medication list given to you today.  Your physician wants you to follow-up in: 6 months. You will receive a reminder letter in the mail two months in advance. If you don't receive a letter, please call our office to schedule the follow-up appointment.  

## 2011-10-01 NOTE — Progress Notes (Signed)
Michelle Martinez Date of Birth:  [birth year] [facility] [address]   [address] [place], [place]  [address] [phone]           Fax   [phone]  HPI: This pleasant 39 gentleman is seen for a six-month followup office visit.  She has a past history of orthostatic hypotension and syncope.  She also has a history of familial hypercholesterolemia.  Since last visit she has been feeling well.  Is not have any further episodes of syncope.  She is making an effort to maintain good intake of fluids and adequate intake of salt.  She is having less leg cramps at night.  She's not having a myalgias from her statin therapy.  He has a history of occasional palpitations and is on atenolol long-term with good results.  Current Outpatient Prescriptions  Medication Sig Dispense Refill  . ARTHROTEC 75 75-200 MG-MCG per tablet Take 1 tablet by mouth Twice daily.      Marland Kitchen atenolol (TENORMIN) 25 MG tablet Take 1 tablet by mouth Daily.      . Calcium Carbonate (CALTRATE 600 PO) Take by mouth daily.        . CYMBALTA 60 MG capsule Take 1 tablet by mouth Daily.      . multivitamin (THERAGRAN) per tablet Take 1 tablet by mouth daily.        . rosuvastatin (CRESTOR) 20 MG tablet Take 1 tablet (20 mg total) by mouth daily.  30 tablet  11    Allergies  Allergen Reactions  . Sulfur     Patient Active Problem List  Diagnoses  . Hypercholesterolemia    History  Smoking status  . Former Smoker  Smokeless tobacco  . Not on file    History  Alcohol Use: Not on file    Family History  Problem Relation Age of Onset  . Cancer Mother   . Heart attack Father   . Cancer Father     Review of Systems: The patient denies any heat or cold intolerance.  No weight gain or weight loss.  The patient denies headaches or blurry vision.  There is no cough or sputum production.  The patient denies dizziness.  There is no hematuria or hematochezia.  The patient denies any muscle  aches or arthritis.  The patient denies any rash.  The patient denies frequent falling or instability.  There is no history of depression or anxiety.  All other systems were reviewed and are negative.   Physical Exam: Filed Vitals:   10/01/11 1344  BP: 100/70  Pulse: 80   the general appearance reveals a well-developed well-nourished woman in no distress.The head and neck exam reveals pupils equal and reactive.  Extraocular movements are full.  There is no scleral icterus.  The mouth and pharynx are normal.  The neck is supple.  The carotids reveal no bruits.  The jugular venous pressure is normal.  The  thyroid is not enlarged.  There is no lymphadenopathy.  The chest is clear to percussion and auscultation.  There are no rales or rhonchi.  Expansion of the chest is symmetrical.  The precordium is quiet.  The first heart sound is normal.  The second heart sound is physiologically split.  There is no murmur gallop rub or click.  There is no abnormal lift or heave.  The abdomen is soft and nontender.  The bowel sounds are normal.  The liver and spleen are not enlarged.  There  are no abdominal masses.  There are no abdominal bruits.  Extremities reveal good pedal pulses.  There is no phlebitis or edema.  There is no cyanosis or clubbing.  Strength is normal and symmetrical in all extremities.  There is no lateralizing weakness.  There are no sensory deficits.  The skin is warm and dry.  There is no rash.      Assessment / Plan: Continue present diet and same dose of Crestor.  Recheck in 6 months for followup office visit and fasting lab work.

## 2011-12-04 ENCOUNTER — Telehealth: Payer: Self-pay | Admitting: Cardiology

## 2011-12-04 NOTE — Telephone Encounter (Signed)
Left message

## 2011-12-04 NOTE — Telephone Encounter (Signed)
New Problem    Patient would like a return call at [phone] regarding Rx discussion.

## 2011-12-05 NOTE — Telephone Encounter (Signed)
Continues to have rapid heart rate, woke her up in the middle of the night Wednesday.  Takes Atenolol 25 mg daily and was wondering if she should Increase or change medication.  Does not know heart rate but was at dinner last night with a physician and was told it was regular.  Will forward to  Dr. Patty Sermons for review

## 2011-12-05 NOTE — Telephone Encounter (Signed)
Left message to call back  

## 2011-12-05 NOTE — Telephone Encounter (Signed)
Fu call °Patient returning your call °

## 2011-12-05 NOTE — Telephone Encounter (Signed)
Try increasing atenolol to BID

## 2011-12-05 NOTE — Telephone Encounter (Signed)
Advised patient and she will call back and let us know how this is working

## 2011-12-11 ENCOUNTER — Other Ambulatory Visit: Payer: Self-pay | Admitting: Cardiology

## 2011-12-11 MED ORDER — ATENOLOL 25 MG PO TABS: 25.0000 mg | ORAL_TABLET | Freq: Two times a day (BID) | ORAL | Status: DC

## 2011-12-11 NOTE — Telephone Encounter (Signed)
Refilled atenolol

## 2012-03-29 ENCOUNTER — Encounter: Payer: Self-pay | Admitting: Gastroenterology

## 2012-04-05 ENCOUNTER — Other Ambulatory Visit (INDEPENDENT_AMBULATORY_CARE_PROVIDER_SITE_OTHER): Payer: BC Managed Care – PPO

## 2012-04-05 DIAGNOSIS — E7800 Pure hypercholesterolemia, unspecified: Secondary | ICD-10-CM

## 2012-04-05 LAB — HEPATIC FUNCTION PANEL
ALT: 34 U/L (ref 0–35)
AST: 28 U/L (ref 0–37)
Albumin: 3.9 g/dL (ref 3.5–5.2)
Alkaline Phosphatase: 47 U/L (ref 39–117)
Bilirubin, Direct: 0 mg/dL (ref 0.0–0.3)
Total Bilirubin: 0 mg/dL — ABNORMAL LOW (ref 0.3–1.2)
Total Protein: 6.5 g/dL (ref 6.0–8.3)

## 2012-04-05 LAB — LIPID PANEL
Cholesterol: 187 mg/dL (ref 0–200)
HDL: 76.8 mg/dL (ref >39.00)
LDL Cholesterol: 104 mg/dL — ABNORMAL HIGH (ref 0–99)
Total CHOL/HDL Ratio: 2
Triglycerides: 33 mg/dL (ref 0.0–149.0)
VLDL: 6.6 mg/dL (ref 0.0–40.0)

## 2012-04-05 LAB — BASIC METABOLIC PANEL
BUN: 19 mg/dL (ref 6–23)
CO2: 30 mEq/L (ref 19–32)
Calcium: 8.8 mg/dL (ref 8.4–10.5)
Chloride: 108 mEq/L (ref 96–112)
Creatinine, Ser: 0.6 mg/dL (ref 0.4–1.2)
GFR: 104.44 mL/min (ref >60.00)
Glucose, Bld: 79 mg/dL (ref 70–99)
Potassium: 4.1 mEq/L (ref 3.5–5.1)
Sodium: 142 mEq/L (ref 135–145)

## 2012-04-05 NOTE — Progress Notes (Signed)
Quick Note:  Please make copy of labs for patient visit. ______ 

## 2012-04-12 ENCOUNTER — Ambulatory Visit (INDEPENDENT_AMBULATORY_CARE_PROVIDER_SITE_OTHER): Payer: BC Managed Care – PPO | Admitting: Cardiology

## 2012-04-12 ENCOUNTER — Encounter: Payer: Self-pay | Admitting: Cardiology

## 2012-04-12 VITALS — BP 106/74 | HR 66 | Ht 62.0 in | Wt 122.0 lb

## 2012-04-12 DIAGNOSIS — E7800 Pure hypercholesterolemia, unspecified: Secondary | ICD-10-CM

## 2012-04-12 NOTE — Progress Notes (Signed)
Michelle Martinez Date of Birth:  1951/03/08 [facility] [address] [place], [place]  [address] [phone]  Fax   [phone]  HPI: This pleasant 61 year old woman is seen for a six-month followup office visit.  She has a past history of orthostatic hypertension and a past history of syncope.  She also has a history of familial hypercholesterolemia.  His last visit she has been doing well.  She's had no further episodes of dizziness or syncope.  She is trying to make sure she drinks plenty of fluids.  She is no longer having any tachycardia since we put her on atenolol.  He has been having severe leg cramps probably secondary to being on Crestor 20 mg daily  Current Outpatient Prescriptions  Medication Sig Dispense Refill  . ARTHROTEC 75 75-200 MG-MCG per tablet Take 1 tablet by mouth Twice daily.      Marland Kitchen atenolol (TENORMIN) 25 MG tablet Take 1 tablet (25 mg total) by mouth 2 (two) times daily.  60 tablet  11  . Calcium Carbonate (CALTRATE 600 PO) Take by mouth daily.        . CYMBALTA 60 MG capsule Take 1 tablet by mouth Daily.      . multivitamin (THERAGRAN) per tablet Take 1 tablet by mouth daily.          Allergies  Allergen Reactions  . Sulfur     Patient Active Problem List  Diagnosis  . Hypercholesterolemia    History  Smoking status  . Former Smoker  Smokeless tobacco  . Not on file    History  Alcohol Use: Not on file    Family History  Problem Relation Age of Onset  . Cancer Mother   . Heart attack Father   . Cancer Father     Review of Systems: The patient denies any heat or cold intolerance.  No weight gain or weight loss.  The patient denies headaches or blurry vision.  There is no cough or sputum production.  The patient denies dizziness.  There is no hematuria or hematochezia.  The patient denies any muscle aches or arthritis.  The patient denies any rash.  The patient denies frequent falling or instability.  There is no  history of depression or anxiety.  All other systems were reviewed and are negative.   Physical Exam: Filed Vitals:   04/12/12 1034  BP: 106/74  Pulse: 66   the general appearance reveals a well-developed well-nourished woman in no distress.  She has lost 2 more pounds since last visit and is on a careful diet.The head and neck exam reveals pupils equal and reactive.  Extraocular movements are full.  There is no scleral icterus.  The mouth and pharynx are normal.  The neck is supple.  The carotids reveal no bruits.  The jugular venous pressure is normal.  The  thyroid is not enlarged.  There is no lymphadenopathy.  The chest is clear to percussion and auscultation.  There are no rales or rhonchi.  Expansion of the chest is symmetrical.  The precordium is quiet.  The first heart sound is normal.  The second heart sound is physiologically split.  There is no murmur gallop rub or click.  There is no abnormal lift or heave.  The abdomen is soft and nontender.  The bowel sounds are normal.  The liver and spleen are not enlarged.  There are no abdominal masses.  There are no abdominal bruits.  Extremities reveal good pedal pulses.  There is no phlebitis or edema.  There is no cyanosis or clubbing.  Strength is normal and symmetrical in all extremities.  There is no lateralizing weakness.  There are no sensory deficits.  The skin is warm and dry.  There is no rash.      Assessment / Plan: Continue same meds except stop Crestor and observe response of cramps.  Consider restart of generic Lipitor a later date.  She will call our nurse in a month to report.  Recheck in 6 months for followup office visit and fasting lab work

## 2012-04-12 NOTE — Assessment & Plan Note (Signed)
We reviewed her lipids from today.  They are satisfactory but because of her severe leg cramps bilaterally we will need to stop her Crestor for the time being.  She will leave it off for 3 or 4 weeks and then call us.  At that point we will probably want to try a different agent at a lower dose.  She would like something generic because she is going to retire in September.  We would consider perhaps generic Lipitor at a low-dose

## 2012-04-12 NOTE — Patient Instructions (Addendum)
Stop Crestor for now and let us know how you are doing with your leg cramps  Your physician wants you to follow-up in: 6 month  You will receive a reminder letter in the mail two months in advance. If you don't receive a letter, please call our office to schedule the follow-up appointment.

## 2012-05-03 ENCOUNTER — Telehealth: Payer: Self-pay | Admitting: Cardiology

## 2012-05-03 NOTE — Telephone Encounter (Signed)
Left message to call back  

## 2012-05-03 NOTE — Telephone Encounter (Signed)
New problem:  Patient calling regarding  medication. Off statin drug. Was told to call back today.

## 2012-05-04 NOTE — Telephone Encounter (Signed)
Fu call °Pt returning your call  °

## 2012-05-04 NOTE — Telephone Encounter (Signed)
Cramping better since stopping Crestor.  She was instructed to call with update and under impression generic Lipitor will be considered for alternative med.  Will forward to  Dr. Patty Sermons for review.  Aware  Dr. Patty Sermons out of the office until next week

## 2012-05-09 NOTE — Telephone Encounter (Signed)
Start generic lipitor 10 mg daily. Recheck labs at next scheduled visit.

## 2012-05-10 NOTE — Telephone Encounter (Signed)
Left message to call back  

## 2012-05-11 NOTE — Telephone Encounter (Signed)
msg left that she is to start lipitor/generic and labs to be rechecked at next ov, told to call back with questions, number provided.

## 2012-05-11 NOTE — Telephone Encounter (Signed)
Fu call °Pt returning your call  °

## 2012-05-20 ENCOUNTER — Telehealth: Payer: Self-pay | Admitting: Cardiology

## 2012-05-20 DIAGNOSIS — E7800 Pure hypercholesterolemia, unspecified: Secondary | ICD-10-CM

## 2012-05-20 MED ORDER — ATORVASTATIN CALCIUM 10 MG PO TABS: 10.0000 mg | ORAL_TABLET | Freq: Every day | ORAL | Status: DC

## 2012-05-20 NOTE — Telephone Encounter (Signed)
Please return call to patient [phone] ext [phone], regarding medical care

## 2012-05-20 NOTE — Telephone Encounter (Signed)
Left message to call back  

## 2012-05-20 NOTE — Telephone Encounter (Signed)
Patient just needing Lipitor sent to pharmacy (starting from phone note in July)

## 2012-07-15 ENCOUNTER — Other Ambulatory Visit (HOSPITAL_COMMUNITY): Payer: Self-pay | Admitting: Internal Medicine

## 2012-07-15 ENCOUNTER — Ambulatory Visit (HOSPITAL_COMMUNITY)
Admission: RE | Admit: 2012-07-15 | Discharge: 2012-07-15 | Disposition: A | Discharge status: Home / Self Care | Payer: BC Managed Care – PPO | Source: Ambulatory Visit | Attending: Internal Medicine | Admitting: Internal Medicine

## 2012-07-15 DIAGNOSIS — M25559 Pain in unspecified hip: Secondary | ICD-10-CM | POA: Insufficient documentation

## 2012-07-15 DIAGNOSIS — M1990 Unspecified osteoarthritis, unspecified site: Secondary | ICD-10-CM

## 2012-07-15 DIAGNOSIS — M1610 Unilateral primary osteoarthritis, unspecified hip: Secondary | ICD-10-CM | POA: Insufficient documentation

## 2012-07-15 DIAGNOSIS — M169 Osteoarthritis of hip, unspecified: Secondary | ICD-10-CM | POA: Insufficient documentation

## 2012-07-27 ENCOUNTER — Encounter: Payer: Self-pay | Admitting: Cardiology

## 2012-09-07 ENCOUNTER — Other Ambulatory Visit: Payer: Self-pay | Admitting: Internal Medicine

## 2012-09-07 DIAGNOSIS — Z1231 Encounter for screening mammogram for malignant neoplasm of breast: Secondary | ICD-10-CM

## 2012-09-30 ENCOUNTER — Ambulatory Visit: Payer: BC Managed Care – PPO

## 2012-10-01 ENCOUNTER — Ambulatory Visit
Admission: RE | Admit: 2012-10-01 | Discharge: 2012-10-01 | Disposition: A | Discharge status: Home / Self Care | Payer: BC Managed Care – PPO | Source: Ambulatory Visit | Attending: Internal Medicine | Admitting: Internal Medicine

## 2012-10-01 DIAGNOSIS — Z1231 Encounter for screening mammogram for malignant neoplasm of breast: Secondary | ICD-10-CM

## 2012-10-29 ENCOUNTER — Other Ambulatory Visit (HOSPITAL_COMMUNITY): Payer: Self-pay | Admitting: Internal Medicine

## 2012-10-29 DIAGNOSIS — M1990 Unspecified osteoarthritis, unspecified site: Secondary | ICD-10-CM

## 2012-10-29 DIAGNOSIS — M549 Dorsalgia, unspecified: Secondary | ICD-10-CM

## 2012-11-02 ENCOUNTER — Ambulatory Visit (HOSPITAL_COMMUNITY)
Admission: RE | Admit: 2012-11-02 | Discharge: 2012-11-02 | Disposition: A | Discharge status: Home / Self Care | Payer: BC Managed Care – PPO | Source: Ambulatory Visit | Attending: Internal Medicine | Admitting: Internal Medicine

## 2012-11-02 ENCOUNTER — Other Ambulatory Visit (HOSPITAL_COMMUNITY): Payer: Self-pay | Admitting: Internal Medicine

## 2012-11-02 DIAGNOSIS — M549 Dorsalgia, unspecified: Secondary | ICD-10-CM

## 2012-11-02 DIAGNOSIS — M5137 Other intervertebral disc degeneration, lumbosacral region: Secondary | ICD-10-CM | POA: Insufficient documentation

## 2012-11-02 DIAGNOSIS — M533 Sacrococcygeal disorders, not elsewhere classified: Secondary | ICD-10-CM | POA: Insufficient documentation

## 2012-11-02 DIAGNOSIS — M1990 Unspecified osteoarthritis, unspecified site: Secondary | ICD-10-CM

## 2012-11-02 DIAGNOSIS — M545 Low back pain, unspecified: Secondary | ICD-10-CM | POA: Insufficient documentation

## 2012-11-02 DIAGNOSIS — IMO0002 Reserved for concepts with insufficient information to code with codable children: Secondary | ICD-10-CM | POA: Insufficient documentation

## 2012-11-02 DIAGNOSIS — M51379 Other intervertebral disc degeneration, lumbosacral region without mention of lumbar back pain or lower extremity pain: Secondary | ICD-10-CM | POA: Insufficient documentation

## 2012-11-25 ENCOUNTER — Other Ambulatory Visit: Payer: Self-pay | Admitting: Neurosurgery

## 2012-11-29 ENCOUNTER — Other Ambulatory Visit: Payer: Self-pay | Admitting: *Deleted

## 2012-11-29 DIAGNOSIS — E7800 Pure hypercholesterolemia, unspecified: Secondary | ICD-10-CM

## 2012-11-29 MED ORDER — ATENOLOL 25 MG PO TABS: 25.0000 mg | ORAL_TABLET | Freq: Two times a day (BID) | ORAL | Status: DC

## 2012-11-29 MED ORDER — ATORVASTATIN CALCIUM 10 MG PO TABS: 10.0000 mg | ORAL_TABLET | Freq: Every day | ORAL | Status: DC

## 2012-12-01 ENCOUNTER — Encounter (HOSPITAL_COMMUNITY): Payer: Self-pay | Admitting: Pharmacy Technician

## 2012-12-08 ENCOUNTER — Encounter (HOSPITAL_COMMUNITY): Payer: Self-pay

## 2012-12-08 ENCOUNTER — Encounter (HOSPITAL_COMMUNITY)
Admission: RE | Admit: 2012-12-08 | Discharge: 2012-12-08 | Disposition: A | Discharge status: Home / Self Care | Payer: BC Managed Care – PPO | Source: Ambulatory Visit | Attending: Neurosurgery | Admitting: Neurosurgery

## 2012-12-08 DIAGNOSIS — Z01818 Encounter for other preprocedural examination: Secondary | ICD-10-CM | POA: Insufficient documentation

## 2012-12-08 DIAGNOSIS — Z01810 Encounter for preprocedural cardiovascular examination: Secondary | ICD-10-CM | POA: Insufficient documentation

## 2012-12-08 DIAGNOSIS — Z01812 Encounter for preprocedural laboratory examination: Secondary | ICD-10-CM | POA: Insufficient documentation

## 2012-12-08 HISTORY — DX: Cardiac arrhythmia, unspecified: I49.9

## 2012-12-08 HISTORY — DX: Adverse effect of unspecified anesthetic, initial encounter: T41.45XA

## 2012-12-08 HISTORY — DX: Headache: R51

## 2012-12-08 HISTORY — DX: Hypotension, unspecified: I95.9

## 2012-12-08 HISTORY — DX: Syncope and collapse: R55

## 2012-12-08 HISTORY — DX: Other complications of anesthesia, initial encounter: T88.59XA

## 2012-12-08 HISTORY — DX: Other specified postprocedural states: Z98.890

## 2012-12-08 HISTORY — DX: Nausea with vomiting, unspecified: R11.2

## 2012-12-08 LAB — HEPATIC FUNCTION PANEL
ALT: 29 U/L (ref 0–35)
AST: 24 U/L (ref 0–37)
Albumin: 4.3 g/dL (ref 3.5–5.2)
Alkaline Phosphatase: 45 U/L (ref 39–117)
Bilirubin, Direct: <0.1 mg/dL (ref 0.0–0.3)
Total Bilirubin: 0.4 mg/dL (ref 0.3–1.2)
Total Protein: 7 g/dL (ref 6.0–8.3)

## 2012-12-08 LAB — CBC
HCT: 42.4 % (ref 36.0–46.0)
Hemoglobin: 14.5 g/dL (ref 12.0–15.0)
MCH: 32.1 pg (ref 26.0–34.0)
MCHC: 34.2 g/dL (ref 30.0–36.0)
MCV: 93.8 fL (ref 78.0–100.0)
Platelets: 211 10*3/uL (ref 150–400)
RBC: 4.52 MIL/uL (ref 3.87–5.11)
RDW: 12.7 % (ref 11.5–15.5)
WBC: 5.7 10*3/uL (ref 4.0–10.5)

## 2012-12-08 LAB — ABO/RH: ABO/RH(D): A POS

## 2012-12-08 LAB — BASIC METABOLIC PANEL
BUN: 17 mg/dL (ref 6–23)
CO2: 31 mEq/L (ref 19–32)
Calcium: 9.9 mg/dL (ref 8.4–10.5)
Chloride: 101 mEq/L (ref 96–112)
Creatinine, Ser: 0.69 mg/dL (ref 0.50–1.10)
GFR calc Af Amer: >90 mL/min (ref >90)
GFR calc non Af Amer: >90 mL/min (ref >90)
Glucose, Bld: 92 mg/dL (ref 70–99)
Potassium: 4 mEq/L (ref 3.5–5.1)
Sodium: 139 mEq/L (ref 135–145)

## 2012-12-08 LAB — SURGICAL PCR SCREEN
MRSA, PCR: NEGATIVE
Staphylococcus aureus: NEGATIVE

## 2012-12-08 LAB — TYPE AND SCREEN
ABO/RH(D): A POS
Antibody Screen: NEGATIVE

## 2012-12-08 NOTE — Pre-Procedure Instructions (Signed)
BUFFI EWTON  12/08/2012   Your procedure is scheduled on: Tuesday  12/14/12   Report to [facility] at 815 AM.  Call this number if you have problems the morning of surgery: [phone]   Remember:   Do not eat food or drink liquids after midnight.   Take these medicines the morning of surgery with A SIP OF WATER: ATENOLOL(TENORMIN), CYMBALTA (STOP ASPIRIN, COUMADIN, PLAVIX, EFFIENT, HERBAL MEDICINES)   Do not wear jewelry, make-up or nail polish.  Do not wear lotions, powders, or perfumes. You may wear deodorant.  Do not shave 48 hours prior to surgery. Men may shave face and neck.  Do not bring valuables to the hospital.  Contacts, dentures or bridgework may not be worn into surgery.  Leave suitcase in the car. After surgery it may be brought to your room.  For patients admitted to the hospital, checkout time is 11:00 AM the day of  discharge.   Patients discharged the day of surgery will not be allowed to drive  home.  Name and phone number of your driver:   Special Instructions: Shower using CHG 2 nights before surgery and the night before surgery.  If you shower the day of surgery use CHG.  Use special wash - you have one bottle of CHG for all showers.  You should use approximately 1/3 of the bottle for each shower.   Please read over the following fact sheets that you were given: Pain Booklet, Coughing and Deep Breathing, Blood Transfusion Information, MRSA Information and Surgical Site Infection Prevention

## 2012-12-13 MED ORDER — CEFAZOLIN SODIUM-DEXTROSE 2-3 GM-% IV SOLR
2.0000 g | INTRAVENOUS | Status: AC
  Administered 2012-12-14: 2 g via INTRAVENOUS
  Filled 2012-12-13: qty 50

## 2012-12-13 NOTE — H&P (Signed)
NEUROSURGICAL CONSULTATION  Michelle Martinez #[id number] DOB:  July 19, 1951  November 24, 2012  HISTORY:     Michelle Martinez is a 62 year old retired woman who presents with back, bilateral hip and leg pain.  She complains of pain mostly in her buttocks and both thighs radiating to her hips.  She said she has had burning along the spine of the low back.  She says that sitting became uncomfortable in August 2013 and this all began in July of 2013.  She saw her primary doctor and then saw Dr. Nickola Major and had injections without any relief.  She has also been in physical therapy without improvement.  She has had an MRI of the lumbar spine which shows severe spinal stenosis at the L4-5 level with severe facet and ligamentous hypertrophy and moderately severe spinal stenosis and milder left lateral recess stenosis and bilateral L4 foraminal stenosis. She also has at L3-4 a broad-based left foraminal disc protrusion which is causing moderate L3 foraminal stenosis and also some left L3 nerve root compression extraforaminally.   She says she can only sit sideways and she is not able to get any relief and has difficulty laying and sleeping.    REVIEW OF SYSTEMS:   A detailed Review of Systems sheet was reviewed with the patient.  Pertinent positives include:  Eyes-glasses; Ear, Nose, Throat and Mouth-nasal congestion/drainage, sinus problems; Cardiovascular-leg pain while walking; Musculoskeletal-Leg weakness, back pain, leg pain, joint pain or swelling, arthritis, neck pain; Integumentary-skin cancer.  All other systems are negative; this includes Constitutional symptoms, Endocrine, Respiratory, Gastrointestinal, Genitourinary, Breast, Neurologic, Psychiatric, Hematologic/Lymphatic, and Allergic/Immunologic.    PAST MEDICAL HISTORY:      Current Medical Conditions:    As previously described.  She has a history of melanoma on her leg, irregular heart beat and depression.       Prior Operations and  Hospitalizations:   Significant for L3-4 microdiskectomy in 2008 done by me and anterior cervical decompression and fusion at C5-6 in 11/04.  She has had two prior C-sections and hysterectomy.      Medications and Allergies:  She is allergic to Sulfur and Codeine.  Current medications include Atenolol 25 mg. q.d. for irregular heartbeat, Lyrica 50 mg. q.d. for  burning in her back, Arthrotec 75 mg. q.d. for arthritis, Duloxetine 60 mg. q.d. for depression and Benadryl 25 mg. p.r.n. for allergies.       Height and Weight:     5'2" tall and 126 pounds.  BMI is 23.0.    FAMILY HISTORY:      SOCIAL HISTORY:    She denies tobacco or drug use.  She is a social drinker of alcoholic beverages.      DIAGNOSTIC STUDIES:   An MRI was done of her sacrum which shows spondylolisthesis of L4 on L5, intact SI joints, no findings of sacral stress or insufficiency fracture.    Radiographs were obtained in the office today which shows evidence of a grade 1-2 spondylolisthesis of L4 on L5 which is 5.5 mm in extension, 8.6 mm in neutral lateral and 11 mm in flexion.      PHYSICAL EXAMINATION:      General Appearance:   Michelle Martinez is a pleasant, cooperative woman in no acute distress.        Blood Pressure, Pulse:     124/72, heart rate 64 and regular, respirations 18.    HEENT - normocephalic, atraumatic.  The pupils are equal, round and reactive to light.  The extraocular  muscles are intact.  Sclerae - white.  Conjunctiva - pink.  Oropharynx benign.  Uvula midline.     Neck - there are no masses, meningismus, deformities, tracheal deviation, jugular vein distention or carotid bruits.  There is normal cervical range of motion.  Spurlings' test is negative without reproducible radicular pain turning the patient's head to either side.  Lhermitte's sign is not present with axial compression.      Respiratory - there is normal respiratory effort with good intercostal function.  Lungs are clear to auscultation.   There are no rales, rhonchi or wheezes.      Cardiovascular - the heart has regular rate and rhythm to auscultation.  No murmurs are appreciated.  There is no extremity edema, cyanosis or clubbing.  There are palpable pedal pulses.      Abdomen - soft, nontender, no hepatosplenomegaly appreciated or masses.  There are active bowel sounds.  No guarding or rebound.      Musculoskeletal Examination - she has a palpable stepoff at the L4-5 level.  She is to bend to within 8 inches of the floor with her upper extremities outstretched.  She is able to stand on her heels and toes.  She has a positive straight leg raise on the right and negative on the left.    NEUROLOGICAL EXAMINATION: The patient is oriented to time, person and place and has good recall of both recent and remote memory with normal attention span and concentration.  The patient speaks with clear and fluent speech and exhibits normal language function and appropriate fund of knowledge.      Cranial Nerve Examination - pupils are equal, round and reactive to light.  Extraocular movements are full.  Visual fields are full to confrontational testing.  Facial sensation and facial movement are symmetric and intact.  Hearing is intact to finger rub.  Palate is upgoing.  Shoulder shrug is symmetric.  Tongue protrudes in the midline.      Motor Examination - motor strength is 5/5 in the bilateral deltoids, biceps, triceps, handgrips, wrist extensors, interosseous.  In the lower extremities motor strength is 5/5 in hip flexion, extension, quadriceps, hamstrings, plantar flexion, dorsiflexion and extensor hallucis longus with 4/5 right extensor hallucis longus strength and 4/5 right hip abductor strength.       Sensory Examination - she has increased pin prick sensation right L4 and L5 distribution.      Deep Tendon Reflexes - 2 in the biceps, triceps, and brachioradialis, 2 in the knees, 2 in the ankles.  The great toes are downgoing to plantar  stimulation.  No pathologic reflexes.       Cerebellar Examination - normal coordination in upper and lower extremities and normal rapid alternating movements.  Romberg test is negative.    IMPRESSION AND RECOMMENDATIONS: Michelle Martinez is a 62 year old woman with a progressively severe complaints of neurogenic claudication to the point where she is not able to sit, stand or do anything and is quite miserable in terms of pain.  She also has a Grade 2 spondylolisthesis at L4 on 5 as evidenced by malalignment of L4 and L5 of 11 mm on flexion.   She has severe spinal stenosis.  She has weakness in her right leg and has noted significant progression of these complaints despite prolonged conservative therapy of greater than 6 months with injections, physical therapy, exercise and a variety of anti-inflammatory medications.    At this point, I do not see an alternative to surgical intervention  and I have recommended that we proceed with L4-5 decompression and fusion.  She has had prior surgery at the L3-4 level but I do not think this is currently symptomatic and while there is significant left extraforaminal L3 nerve root contact there does not appear to be significant nerve root compression and I do not think anything needs to be done at that level.  Her progressive spondylolisthesis is secondary to severe facet degenerative changes with severe spinal stenosis.  She is fitted for an LSO brace.  Surgery is scheduled for 12/14/2012.  Risks and benefits were discussed in detail with the patient.  She says that she wants to get some relief of her pain and to this end I have given her prescriptions for Lyrica 50 mg. b.i.d., dispensed #60 and Tramadol 50 mg. every 6 hours as needed #60.  Given her previous history of Sulfur allergy, I will probably change that from Tramadol to another medication but hopefully the Lyrica will give her some relief.    I spent greater than one hour in consultation and examination of  the patient.     [facility]    Danae Orleans. Venetia Maxon, M.D.  JDS:gde  cc: Dr. Dwana Melena

## 2012-12-13 NOTE — Progress Notes (Signed)
Contacted patient, notified of new surgery time 1720pm and new arrival time of 1420pm.  Gave patient short stay phone number to call and verify day of surgery.

## 2012-12-14 ENCOUNTER — Encounter (HOSPITAL_COMMUNITY): Payer: Self-pay | Admitting: Anesthesiology

## 2012-12-14 ENCOUNTER — Ambulatory Visit (HOSPITAL_COMMUNITY): Payer: BC Managed Care – PPO | Admitting: Anesthesiology

## 2012-12-14 ENCOUNTER — Encounter (HOSPITAL_COMMUNITY)
Admission: RE | Disposition: A | Discharge status: Home Health | Payer: Self-pay | Source: Ambulatory Visit | Attending: Neurosurgery

## 2012-12-14 ENCOUNTER — Ambulatory Visit (HOSPITAL_COMMUNITY): Payer: BC Managed Care – PPO

## 2012-12-14 ENCOUNTER — Inpatient Hospital Stay (HOSPITAL_COMMUNITY)
Admission: RE | Admit: 2012-12-14 | Discharge: 2012-12-18 | DRG: 884 | Disposition: A | Discharge status: Home Health | Payer: BC Managed Care – PPO | Source: Ambulatory Visit | Attending: Neurosurgery | Admitting: Neurosurgery

## 2012-12-14 DIAGNOSIS — Z85820 Personal history of malignant melanoma of skin: Secondary | ICD-10-CM

## 2012-12-14 DIAGNOSIS — Z01810 Encounter for preprocedural cardiovascular examination: Secondary | ICD-10-CM

## 2012-12-14 DIAGNOSIS — Z79899 Other long term (current) drug therapy: Secondary | ICD-10-CM

## 2012-12-14 DIAGNOSIS — F329 Major depressive disorder, single episode, unspecified: Secondary | ICD-10-CM | POA: Diagnosis present

## 2012-12-14 DIAGNOSIS — F3289 Other specified depressive episodes: Secondary | ICD-10-CM | POA: Diagnosis present

## 2012-12-14 DIAGNOSIS — Z981 Arthrodesis status: Secondary | ICD-10-CM

## 2012-12-14 DIAGNOSIS — M47817 Spondylosis without myelopathy or radiculopathy, lumbosacral region: Secondary | ICD-10-CM | POA: Diagnosis present

## 2012-12-14 DIAGNOSIS — M4310 Spondylolisthesis, site unspecified: Secondary | ICD-10-CM | POA: Diagnosis present

## 2012-12-14 DIAGNOSIS — Z01818 Encounter for other preprocedural examination: Secondary | ICD-10-CM

## 2012-12-14 DIAGNOSIS — Z01812 Encounter for preprocedural laboratory examination: Secondary | ICD-10-CM

## 2012-12-14 DIAGNOSIS — M4120 Other idiopathic scoliosis, site unspecified: Principal | ICD-10-CM | POA: Diagnosis present

## 2012-12-14 DIAGNOSIS — R339 Retention of urine, unspecified: Secondary | ICD-10-CM | POA: Diagnosis not present

## 2012-12-14 SURGERY — POSTERIOR LUMBAR FUSION 1 LEVEL
Anesthesia: General | Site: Back | Wound class: Clean

## 2012-12-14 MED ORDER — DOCUSATE SODIUM 100 MG PO CAPS
100.0000 mg | ORAL_CAPSULE | Freq: Two times a day (BID) | ORAL | Status: DC
  Administered 2012-12-14 – 2012-12-17 (×6): 100 mg via ORAL
  Filled 2012-12-14 (×8): qty 1

## 2012-12-14 MED ORDER — ONDANSETRON HCL 4 MG/2ML IJ SOLN
INTRAMUSCULAR | Status: DC | PRN
  Administered 2012-12-14: 4 mg via INTRAVENOUS

## 2012-12-14 MED ORDER — PHENOL 1.4 % MT LIQD: 1.0000 | OROMUCOSAL | Status: DC | PRN

## 2012-12-14 MED ORDER — ONDANSETRON HCL 4 MG/2ML IJ SOLN
4.0000 mg | INTRAMUSCULAR | Status: DC | PRN
  Administered 2012-12-16 (×2): 4 mg via INTRAVENOUS
  Filled 2012-12-14 (×2): qty 2

## 2012-12-14 MED ORDER — ACETAMINOPHEN 325 MG PO TABS
650.0000 mg | ORAL_TABLET | ORAL | Status: DC | PRN
  Administered 2012-12-16: 650 mg via ORAL
  Filled 2012-12-14 (×2): qty 2

## 2012-12-14 MED ORDER — PREGABALIN 50 MG PO CAPS: 50.0000 mg | ORAL_CAPSULE | Freq: Two times a day (BID) | ORAL | Status: DC | PRN

## 2012-12-14 MED ORDER — PHENYLEPHRINE HCL 10 MG/ML IJ SOLN
INTRAMUSCULAR | Status: DC | PRN
  Administered 2012-12-14 (×3): 40 ug via INTRAVENOUS

## 2012-12-14 MED ORDER — HYDROCODONE-ACETAMINOPHEN 5-325 MG PO TABS
1.0000 | ORAL_TABLET | ORAL | Status: DC | PRN
  Administered 2012-12-16 – 2012-12-17 (×4): 1 via ORAL
  Filled 2012-12-14 (×4): qty 1

## 2012-12-14 MED ORDER — KCL IN DEXTROSE-NACL 20-5-0.45 MEQ/L-%-% IV SOLN
INTRAVENOUS | Status: DC
  Administered 2012-12-14: 20:00:00 via INTRAVENOUS
  Filled 2012-12-14 (×8): qty 1000

## 2012-12-14 MED ORDER — BUPIVACAINE HCL (PF) 0.5 % IJ SOLN
INTRAMUSCULAR | Status: DC | PRN
  Administered 2012-12-14: 5 mL

## 2012-12-14 MED ORDER — CEFAZOLIN SODIUM 1-5 GM-% IV SOLN
1.0000 g | Freq: Three times a day (TID) | INTRAVENOUS | Status: AC
  Administered 2012-12-14 – 2012-12-15 (×2): 1 g via INTRAVENOUS
  Filled 2012-12-14 (×2): qty 50

## 2012-12-14 MED ORDER — LIDOCAINE-EPINEPHRINE 1 %-1:100000 IJ SOLN
INTRAMUSCULAR | Status: DC | PRN
  Administered 2012-12-14: 5 mL

## 2012-12-14 MED ORDER — ALBUMIN HUMAN 5 % IV SOLN
INTRAVENOUS | Status: DC | PRN
  Administered 2012-12-14: 15:00:00 via INTRAVENOUS

## 2012-12-14 MED ORDER — CEFAZOLIN SODIUM-DEXTROSE 2-3 GM-% IV SOLR
INTRAVENOUS | Status: AC
  Filled 2012-12-14: qty 50

## 2012-12-14 MED ORDER — ACETAMINOPHEN 650 MG RE SUPP: 650.0000 mg | RECTAL | Status: DC | PRN

## 2012-12-14 MED ORDER — SODIUM CHLORIDE 0.9 % IV SOLN
INTRAVENOUS | Status: DC | PRN
  Administered 2012-12-14: 16:00:00 via INTRAVENOUS

## 2012-12-14 MED ORDER — LIDOCAINE HCL (CARDIAC) 20 MG/ML IV SOLN
INTRAVENOUS | Status: DC | PRN
  Administered 2012-12-14: 50 mg via INTRAVENOUS

## 2012-12-14 MED ORDER — ALUM & MAG HYDROXIDE-SIMETH 200-200-20 MG/5ML PO SUSP: 30.0000 mL | Freq: Four times a day (QID) | ORAL | Status: DC | PRN

## 2012-12-14 MED ORDER — MENTHOL 3 MG MT LOZG: 1.0000 | LOZENGE | OROMUCOSAL | Status: DC | PRN

## 2012-12-14 MED ORDER — NALOXONE HCL 0.4 MG/ML IJ SOLN: 0.4000 mg | INTRAMUSCULAR | Status: DC | PRN

## 2012-12-14 MED ORDER — HYDROMORPHONE HCL PF 1 MG/ML IJ SOLN
INTRAMUSCULAR | Status: AC
  Administered 2012-12-14 (×2): 0.5 mg
  Filled 2012-12-14: qty 1

## 2012-12-14 MED ORDER — SODIUM CHLORIDE 0.9 % IJ SOLN
3.0000 mL | Freq: Two times a day (BID) | INTRAMUSCULAR | Status: DC
  Administered 2012-12-14 – 2012-12-17 (×3): 3 mL via INTRAVENOUS

## 2012-12-14 MED ORDER — DULOXETINE HCL 60 MG PO CPEP
60.0000 mg | ORAL_CAPSULE | Freq: Every day | ORAL | Status: DC
  Administered 2012-12-14 – 2012-12-18 (×5): 60 mg via ORAL
  Filled 2012-12-14 (×5): qty 1

## 2012-12-14 MED ORDER — ARTIFICIAL TEARS OP OINT
TOPICAL_OINTMENT | OPHTHALMIC | Status: DC | PRN
  Administered 2012-12-14: 1 via OPHTHALMIC

## 2012-12-14 MED ORDER — DIAZEPAM 5 MG PO TABS
ORAL_TABLET | ORAL | Status: AC
  Filled 2012-12-14: qty 1

## 2012-12-14 MED ORDER — MIDAZOLAM HCL 5 MG/5ML IJ SOLN
INTRAMUSCULAR | Status: DC | PRN
  Administered 2012-12-14 (×2): 1 mg via INTRAVENOUS

## 2012-12-14 MED ORDER — SODIUM CHLORIDE 0.9 % IV SOLN: 250.0000 mL | INTRAVENOUS | Status: DC

## 2012-12-14 MED ORDER — DIPHENHYDRAMINE HCL 25 MG PO CAPS: 25.0000 mg | ORAL_CAPSULE | Freq: Four times a day (QID) | ORAL | Status: DC | PRN

## 2012-12-14 MED ORDER — OXYCODONE-ACETAMINOPHEN 5-325 MG PO TABS
ORAL_TABLET | ORAL | Status: AC
  Filled 2012-12-14: qty 2

## 2012-12-14 MED ORDER — FLEET ENEMA 7-19 GM/118ML RE ENEM: 1.0000 | ENEMA | Freq: Once | RECTAL | Status: AC | PRN

## 2012-12-14 MED ORDER — SODIUM CHLORIDE 0.9 % IJ SOLN: 3.0000 mL | INTRAMUSCULAR | Status: DC | PRN

## 2012-12-14 MED ORDER — BISACODYL 10 MG RE SUPP: 10.0000 mg | Freq: Every day | RECTAL | Status: DC | PRN

## 2012-12-14 MED ORDER — THROMBIN 20000 UNITS EX SOLR
CUTANEOUS | Status: DC | PRN
  Administered 2012-12-14: 15:00:00 via TOPICAL

## 2012-12-14 MED ORDER — DEXAMETHASONE SODIUM PHOSPHATE 10 MG/ML IJ SOLN: INTRAMUSCULAR | Status: DC | PRN

## 2012-12-14 MED ORDER — GLYCOPYRROLATE 0.2 MG/ML IJ SOLN
INTRAMUSCULAR | Status: DC | PRN
  Administered 2012-12-14: 0.4 mg via INTRAVENOUS

## 2012-12-14 MED ORDER — SENNA 8.6 MG PO TABS
1.0000 | ORAL_TABLET | Freq: Two times a day (BID) | ORAL | Status: DC
  Administered 2012-12-14 – 2012-12-18 (×8): 8.6 mg via ORAL
  Filled 2012-12-14 (×10): qty 1

## 2012-12-14 MED ORDER — POLYETHYLENE GLYCOL 3350 17 G PO PACK
17.0000 g | PACK | Freq: Every day | ORAL | Status: DC | PRN
  Filled 2012-12-14: qty 1

## 2012-12-14 MED ORDER — ADULT MULTIVITAMIN W/MINERALS CH
1.0000 | ORAL_TABLET | Freq: Every day | ORAL | Status: DC
  Administered 2012-12-14 – 2012-12-18 (×5): 1 via ORAL
  Filled 2012-12-14 (×5): qty 1

## 2012-12-14 MED ORDER — DIPHENHYDRAMINE HCL 12.5 MG/5ML PO ELIX: 12.5000 mg | ORAL_SOLUTION | Freq: Four times a day (QID) | ORAL | Status: DC | PRN

## 2012-12-14 MED ORDER — ROCURONIUM BROMIDE 100 MG/10ML IV SOLN
INTRAVENOUS | Status: DC | PRN
  Administered 2012-12-14: 5 mg via INTRAVENOUS
  Administered 2012-12-14: 10 mg via INTRAVENOUS
  Administered 2012-12-14: 25 mg via INTRAVENOUS
  Administered 2012-12-14: 10 mg via INTRAVENOUS

## 2012-12-14 MED ORDER — DEXAMETHASONE SODIUM PHOSPHATE 4 MG/ML IJ SOLN
INTRAMUSCULAR | Status: DC | PRN
  Administered 2012-12-14: 8 mg via INTRAVENOUS

## 2012-12-14 MED ORDER — ESMOLOL HCL 10 MG/ML IV SOLN
INTRAVENOUS | Status: DC | PRN
  Administered 2012-12-14: 15 mg via INTRAVENOUS

## 2012-12-14 MED ORDER — ATORVASTATIN CALCIUM 10 MG PO TABS
10.0000 mg | ORAL_TABLET | Freq: Every day | ORAL | Status: DC
  Administered 2012-12-14 – 2012-12-18 (×5): 10 mg via ORAL
  Filled 2012-12-14 (×6): qty 1

## 2012-12-14 MED ORDER — LACTATED RINGERS IV SOLN
INTRAVENOUS | Status: DC | PRN
  Administered 2012-12-14 (×2): via INTRAVENOUS

## 2012-12-14 MED ORDER — PHENYLEPHRINE HCL 10 MG/ML IJ SOLN
20.0000 mg | INTRAVENOUS | Status: DC | PRN
  Administered 2012-12-14: 20 ug/min via INTRAVENOUS

## 2012-12-14 MED ORDER — SODIUM CHLORIDE 0.9 % IJ SOLN: 9.0000 mL | INTRAMUSCULAR | Status: DC | PRN

## 2012-12-14 MED ORDER — PROPOFOL 10 MG/ML IV BOLUS
INTRAVENOUS | Status: DC | PRN
  Administered 2012-12-14: 200 mg via INTRAVENOUS

## 2012-12-14 MED ORDER — OXYCODONE-ACETAMINOPHEN 5-325 MG PO TABS
1.0000 | ORAL_TABLET | ORAL | Status: DC | PRN
  Administered 2012-12-14 – 2012-12-16 (×5): 2 via ORAL
  Administered 2012-12-16 – 2012-12-18 (×4): 1 via ORAL
  Filled 2012-12-14 (×2): qty 2
  Filled 2012-12-14 (×2): qty 1
  Filled 2012-12-14: qty 2
  Filled 2012-12-14 (×2): qty 1
  Filled 2012-12-14 (×2): qty 2

## 2012-12-14 MED ORDER — ZOLPIDEM TARTRATE 5 MG PO TABS: 5.0000 mg | ORAL_TABLET | Freq: Every evening | ORAL | Status: DC | PRN

## 2012-12-14 MED ORDER — HYDROMORPHONE 0.3 MG/ML IV SOLN
INTRAVENOUS | Status: DC
  Administered 2012-12-14: 21:00:00 via INTRAVENOUS
  Administered 2012-12-15 (×2): 1.2 mg via INTRAVENOUS
  Filled 2012-12-14: qty 25

## 2012-12-14 MED ORDER — DIPHENHYDRAMINE HCL 50 MG/ML IJ SOLN: 12.5000 mg | Freq: Four times a day (QID) | INTRAMUSCULAR | Status: DC | PRN

## 2012-12-14 MED ORDER — LIDOCAINE HCL 4 % MT SOLN
OROMUCOSAL | Status: DC | PRN
  Administered 2012-12-14: 3 mL via TOPICAL

## 2012-12-14 MED ORDER — NEOSTIGMINE METHYLSULFATE 1 MG/ML IJ SOLN
INTRAMUSCULAR | Status: DC | PRN
  Administered 2012-12-14: 3 mg via INTRAVENOUS

## 2012-12-14 MED ORDER — ONDANSETRON HCL 4 MG/2ML IJ SOLN: 4.0000 mg | Freq: Four times a day (QID) | INTRAMUSCULAR | Status: DC | PRN

## 2012-12-14 MED ORDER — PANTOPRAZOLE SODIUM 40 MG IV SOLR
40.0000 mg | Freq: Every day | INTRAVENOUS | Status: DC
  Administered 2012-12-14: 40 mg via INTRAVENOUS
  Filled 2012-12-14 (×2): qty 40

## 2012-12-14 MED ORDER — ACETAMINOPHEN 10 MG/ML IV SOLN
INTRAVENOUS | Status: AC
  Administered 2012-12-14: 1000 mg via INTRAVENOUS
  Filled 2012-12-14: qty 100

## 2012-12-14 MED ORDER — 0.9 % SODIUM CHLORIDE (POUR BTL) OPTIME
TOPICAL | Status: DC | PRN
  Administered 2012-12-14: 1000 mL

## 2012-12-14 MED ORDER — DIAZEPAM 5 MG PO TABS
5.0000 mg | ORAL_TABLET | Freq: Four times a day (QID) | ORAL | Status: DC | PRN
  Administered 2012-12-14 – 2012-12-18 (×8): 5 mg via ORAL
  Filled 2012-12-14 (×8): qty 1

## 2012-12-14 MED ORDER — FENTANYL CITRATE 0.05 MG/ML IJ SOLN
INTRAMUSCULAR | Status: DC | PRN
  Administered 2012-12-14: 50 ug via INTRAVENOUS
  Administered 2012-12-14: 150 ug via INTRAVENOUS
  Administered 2012-12-14: 75 ug via INTRAVENOUS
  Administered 2012-12-14: 50 ug via INTRAVENOUS
  Administered 2012-12-14: 25 ug via INTRAVENOUS

## 2012-12-14 MED ORDER — ATENOLOL 25 MG PO TABS
25.0000 mg | ORAL_TABLET | Freq: Two times a day (BID) | ORAL | Status: DC
  Administered 2012-12-15 – 2012-12-18 (×2): 25 mg via ORAL
  Filled 2012-12-14 (×9): qty 1

## 2012-12-14 SURGICAL SUPPLY — 74 items
BAG DECANTER FOR FLEXI CONT (MISCELLANEOUS) IMPLANT
BENZOIN TINCTURE PRP APPL 2/3 (GAUZE/BANDAGES/DRESSINGS) ×2 IMPLANT
BLADE SURG ROTATE 9660 (MISCELLANEOUS) IMPLANT
BONE VOID FILLER STRIP 10CC (Bone Implant) ×2 IMPLANT
BUR MATCHSTICK NEURO 3.0 LAGG (BURR) ×2 IMPLANT
BUR PRECISION FLUTE 5.0 (BURR) ×2 IMPLANT
CANISTER SUCTION 2500CC (MISCELLANEOUS) ×2 IMPLANT
CLOTH BEACON ORANGE TIMEOUT ST (SAFETY) ×2 IMPLANT
CONT SPEC 4OZ CLIKSEAL STRL BL (MISCELLANEOUS) ×4 IMPLANT
COVER BACK TABLE 24X17X13 BIG (DRAPES) IMPLANT
COVER TABLE BACK 60X90 (DRAPES) ×2 IMPLANT
DERMABOND ADVANCED (GAUZE/BANDAGES/DRESSINGS) ×1
DERMABOND ADVANCED .7 DNX12 (GAUZE/BANDAGES/DRESSINGS) ×1 IMPLANT
DRAPE C-ARM 42X72 X-RAY (DRAPES) ×4 IMPLANT
DRAPE LAPAROTOMY 100X72X124 (DRAPES) ×2 IMPLANT
DRAPE POUCH INSTRU U-SHP 10X18 (DRAPES) ×2 IMPLANT
DRAPE SURG 17X23 STRL (DRAPES) ×2 IMPLANT
DRESSING TELFA 8X3 (GAUZE/BANDAGES/DRESSINGS) ×2 IMPLANT
DURAPREP 26ML APPLICATOR (WOUND CARE) ×2 IMPLANT
ELECT REM PT RETURN 9FT ADLT (ELECTROSURGICAL) ×2
ELECTRODE REM PT RTRN 9FT ADLT (ELECTROSURGICAL) ×1 IMPLANT
EVACUATOR 1/8 PVC DRAIN (DRAIN) ×2 IMPLANT
GAUZE SPONGE 4X4 16PLY XRAY LF (GAUZE/BANDAGES/DRESSINGS) IMPLANT
GLOVE BIO SURGEON STRL SZ8 (GLOVE) ×4 IMPLANT
GLOVE BIOGEL PI IND STRL 8 (GLOVE) ×2 IMPLANT
GLOVE BIOGEL PI IND STRL 8.5 (GLOVE) ×2 IMPLANT
GLOVE BIOGEL PI INDICATOR 8 (GLOVE) ×2
GLOVE BIOGEL PI INDICATOR 8.5 (GLOVE) ×2
GLOVE ECLIPSE 7.5 STRL STRAW (GLOVE) ×6 IMPLANT
GLOVE ECLIPSE 8.0 STRL XLNG CF (GLOVE) ×4 IMPLANT
GLOVE EXAM NITRILE LRG STRL (GLOVE) IMPLANT
GLOVE EXAM NITRILE MD LF STRL (GLOVE) IMPLANT
GLOVE EXAM NITRILE XL STR (GLOVE) IMPLANT
GLOVE EXAM NITRILE XS STR PU (GLOVE) IMPLANT
GLOVE INDICATOR 8.0 STRL GRN (GLOVE) ×4 IMPLANT
GOWN BRE IMP SLV AUR LG STRL (GOWN DISPOSABLE) ×2 IMPLANT
GOWN BRE IMP SLV AUR XL STRL (GOWN DISPOSABLE) ×4 IMPLANT
GOWN STRL REIN 2XL LVL4 (GOWN DISPOSABLE) ×6 IMPLANT
KIT BASIN OR (CUSTOM PROCEDURE TRAY) ×2 IMPLANT
KIT INFUSE SMALL (Orthopedic Implant) ×2 IMPLANT
KIT POSITION SURG JACKSON T1 (MISCELLANEOUS) ×2 IMPLANT
KIT ROOM TURNOVER OR (KITS) ×2 IMPLANT
MILL MEDIUM DISP (BLADE) ×2 IMPLANT
NEEDLE HYPO 25X1 1.5 SAFETY (NEEDLE) ×2 IMPLANT
NEEDLE SPNL 18GX3.5 QUINCKE PK (NEEDLE) IMPLANT
NS IRRIG 1000ML POUR BTL (IV SOLUTION) ×2 IMPLANT
PACK LAMINECTOMY NEURO (CUSTOM PROCEDURE TRAY) ×2 IMPLANT
PAD ARMBOARD 7.5X6 YLW CONV (MISCELLANEOUS) ×6 IMPLANT
PATTIES SURGICAL .5 X.5 (GAUZE/BANDAGES/DRESSINGS) IMPLANT
PATTIES SURGICAL .5 X1 (DISPOSABLE) IMPLANT
PATTIES SURGICAL 1X1 (DISPOSABLE) IMPLANT
PEEK PLIF NOVEL 9X25X10 (Peek) ×4 IMPLANT
ROD 35MM (Rod) ×4 IMPLANT
SCREW 40MM (Screw) ×4 IMPLANT
SCREW POLYAX 6.5X45MM (Screw) ×4 IMPLANT
SCREW SET SPINAL STD HEXALOBE (Screw) ×8 IMPLANT
SPONGE GAUZE 4X4 12PLY (GAUZE/BANDAGES/DRESSINGS) ×2 IMPLANT
SPONGE LAP 4X18 X RAY DECT (DISPOSABLE) IMPLANT
SPONGE SURGIFOAM ABS GEL 100 (HEMOSTASIS) ×2 IMPLANT
STAPLER SKIN PROX WIDE 3.9 (STAPLE) IMPLANT
STRIP CLOSURE SKIN 1/2X4 (GAUZE/BANDAGES/DRESSINGS) ×2 IMPLANT
SUT VIC AB 1 CT1 18XBRD ANBCTR (SUTURE) ×2 IMPLANT
SUT VIC AB 1 CT1 8-18 (SUTURE) ×2
SUT VIC AB 2-0 CT1 18 (SUTURE) ×4 IMPLANT
SUT VIC AB 3-0 SH 8-18 (SUTURE) ×4 IMPLANT
SYR 20ML ECCENTRIC (SYRINGE) ×2 IMPLANT
SYR 3ML LL SCALE MARK (SYRINGE) ×4 IMPLANT
SYR 5ML LL (SYRINGE) IMPLANT
TAPE CLOTH SURG 4X10 WHT LF (GAUZE/BANDAGES/DRESSINGS) ×2 IMPLANT
TOWEL OR 17X24 6PK STRL BLUE (TOWEL DISPOSABLE) ×2 IMPLANT
TOWEL OR 17X26 10 PK STRL BLUE (TOWEL DISPOSABLE) ×2 IMPLANT
TRAP SPECIMEN MUCOUS 40CC (MISCELLANEOUS) ×2 IMPLANT
TRAY FOLEY CATH 14FRSI W/METER (CATHETERS) ×2 IMPLANT
WATER STERILE IRR 1000ML POUR (IV SOLUTION) ×2 IMPLANT

## 2012-12-14 NOTE — Anesthesia Postprocedure Evaluation (Signed)
  Anesthesia Post-op Note  Patient: Michelle Martinez  Procedure(s) Performed: Procedure(s) with comments: POSTERIOR LUMBAR FUSION 1 LEVEL (N/A) - Lumbar four-five Decompression/Fusion  Patient Location: PACU  Anesthesia Type:General  Level of Consciousness: awake, alert , oriented, sedated and patient cooperative  Airway and Oxygen Therapy: Patient Spontanous Breathing  Post-op Pain: mild  Post-op Assessment: Post-op Vital signs reviewed, Patient's Cardiovascular Status Stable, Respiratory Function Stable, Patent Airway, No signs of Nausea or vomiting and Pain level controlled  Post-op Vital Signs: stable  Complications: No apparent anesthesia complications

## 2012-12-14 NOTE — Op Note (Signed)
12/14/2012  4:37 PM  PATIENT:  Michelle Martinez  62 y.o. female  PRE-OPERATIVE DIAGNOSIS:  Spondylolisthesis, Lumbar stenosis, Lumbar spondylosis, Lumbar scoliosis, Lumbar radiculopathy L 45  POST-OPERATIVE DIAGNOSIS:  Spondylolisthesis, Lumbar stenosis, Lumbar spondylosis, Lumbar scoliosis, Lumbar radiculopathy L 45  PROCEDURE:  Procedure(s) with comments: POSTERIOR LUMBAR FUSION 1 LEVEL (N/A) - Lumbar four-five Decompression/Fusion with PEEK interbody cages, pedicle screw fixation and posterolateral arthrodesis L 45  SURGEON:  Surgeon(s) and Role:    * Joseph Stern, MD - Primary    * Randy O Kritzer, MD - Assisting  PHYSICIAN ASSISTANT:   ASSISTANTS: Poteat, RN   ANESTHESIA:   general  EBL:  Total I/O In: 2275 [I.V.:1900; Blood:125; IV Piggyback:250] Out: 650 [Urine:250; Blood:400]  BLOOD ADMINISTERED:100 CC PRBC  DRAINS: (Medium) Hemovact drain(s) in the epidural space with  Suction Open   LOCAL MEDICATIONS USED:  MARCAINE     SPECIMEN:  No Specimen  DISPOSITION OF SPECIMEN:  N/A  COUNTS:  YES  TOURNIQUET:  * No tourniquets in log *  DICTATION: Patient is 62-year-old woman with mobile spondylolisthesis of L4 on L5 with lumbar stenosis, scoliosis, right leg weakness. She has a severe right L5 radiculopathy. It was elected to take her to surgery for decompression and fusion at this level.   Procedure: Patient was placed in a prone position on the Jackson table after smooth and uncomplicated induction of general endotracheal anesthesia. Her low back was prepped and draped in usual sterile fashion with betadine scrub and DuraPrep. Area of incision was infiltrated with local lidocaine. Incision was made to the lumbodorsal fascia was incised and exposure was performed of the L4 through L5 spinous processes laminae facet joint and transverse processes. Intraoperative x-ray was obtained which confirmed correct orientation. A total laminectomy of L4 was performed with  disarticulation of the facet joints at this level and thorough decompression was performed of both L4 and L5 nerve roots along with the common dural tube. This decompression was more involved than would be typical of that performed for PLIF alone and included painstaking dissection of adherent ligament compressing the thecal sac and wide decompression of all neural elements. A thorough discectomy was initially performed on the left with preparation of the endplates for grafting a trial spacer was placed this level and a thorough discectomy was performed on the right as well. Bone autograft was packed within the interspace bilaterally along with small BMP kit and NexOss bone graft extender. Bilateral median 10 mm peek cages were packed with BMP and extender and was inserted the interspace and countersunk appropriately along with 6 cc of morselized bone autograft. The posterolateral region was extensively decorticated and pedicle probes were placed at L4 and L5 bilaterally. Intraoperative fluoroscopy confirmed correct orientationin the AP and lateral plane. 40 x 6.5 mm pedicle screws were placed at L5 bilaterally and 45 x 6.5 mm screws placed at L4 bilaterally final x-rays demonstrated well-positioned interbody grafts and pedicle screw fixation. A 35 mm lordotic rod was placed on the right and a 35 mm rod was placed on the left locked down in compressionand the posterolateral region was packed with the remaining autograft and bone graft extender bilaterally. The wound was irrigated and a medium Hemovac drain was placed in the epidural space. Fascia was closed with 1 Vicryl sutures skin edges were reapproximated 2 and 3-0 Vicryl sutures. The wound is dressed with benzoin Steri-Strips Telfa gauze and tape the patient was extubated in the operating room and taken to recovery in stable   satisfactory condition. She tolerated the operation well counts were correct at the end of the case.   PLAN OF CARE: Admit to inpatient    PATIENT DISPOSITION:  PACU - hemodynamically stable.   Delay start of Pharmacological VTE agent (>24hrs) due to surgical blood loss or risk of bleeding: yes  

## 2012-12-14 NOTE — Interval H&P Note (Signed)
History and Physical Interval Note:  12/14/2012 9:08 AM  Michelle Martinez  has presented today for surgery, with the diagnosis of Spondylolisthesis, Lumbar stenosis, Lumbar spondylosis, Lumbar radiculopathy  The various methods of treatment have been discussed with the patient and family. After consideration of risks, benefits and other options for treatment, the patient has consented to  Procedure(s) with comments: POSTERIOR LUMBAR FUSION 1 LEVEL (N/A) - L4-5 Decompression/Fusion, C ARM as a surgical intervention .  The patient's history has been reviewed, patient examined, no change in status, stable for surgery.  I have reviewed the patient's chart and labs.  Questions were answered to the patient's satisfaction.     STERN,JOSEPH D

## 2012-12-14 NOTE — Progress Notes (Signed)
Awake, alert, conversant.  Full strength both lower extremities.  Doing well.  

## 2012-12-14 NOTE — Brief Op Note (Signed)
12/14/2012  4:37 PM  PATIENT:  Michelle Martinez  62 y.o. female  PRE-OPERATIVE DIAGNOSIS:  Spondylolisthesis, Lumbar stenosis, Lumbar spondylosis, Lumbar scoliosis, Lumbar radiculopathy L 45  POST-OPERATIVE DIAGNOSIS:  Spondylolisthesis, Lumbar stenosis, Lumbar spondylosis, Lumbar scoliosis, Lumbar radiculopathy L 45  PROCEDURE:  Procedure(s) with comments: POSTERIOR LUMBAR FUSION 1 LEVEL (N/A) - Lumbar four-five Decompression/Fusion with PEEK interbody cages, pedicle screw fixation and posterolateral arthrodesis L 45  SURGEON:  Surgeon(s) and Role:    * Maeola Harman, MD - Primary    * Reinaldo Meeker, MD - Assisting  PHYSICIAN ASSISTANT:   ASSISTANTS: Poteat, RN   ANESTHESIA:   general  EBL:  Total I/O In: 2275 [I.V.:1900; Blood:125; IV Piggyback:250] Out: 650 [Urine:250; Blood:400]  BLOOD ADMINISTERED:100 CC PRBC  DRAINS: (Medium) Hemovact drain(s) in the epidural space with  Suction Open   LOCAL MEDICATIONS USED:  MARCAINE     SPECIMEN:  No Specimen  DISPOSITION OF SPECIMEN:  N/A  COUNTS:  YES  TOURNIQUET:  * No tourniquets in log *  DICTATION: Patient is 62 year old woman with mobile spondylolisthesis of L4 on L5 with lumbar stenosis, scoliosis, right leg weakness. She has a severe right L5 radiculopathy. It was elected to take her to surgery for decompression and fusion at this level.   Procedure: Patient was placed in a prone position on the [place] table after smooth and uncomplicated induction of general endotracheal anesthesia. Her low back was prepped and draped in usual sterile fashion with betadine scrub and DuraPrep. Area of incision was infiltrated with local lidocaine. Incision was made to the lumbodorsal fascia was incised and exposure was performed of the L4 through L5 spinous processes laminae facet joint and transverse processes. Intraoperative x-ray was obtained which confirmed correct orientation. A total laminectomy of L4 was performed with  disarticulation of the facet joints at this level and thorough decompression was performed of both L4 and L5 nerve roots along with the common dural tube. This decompression was more involved than would be typical of that performed for PLIF alone and included painstaking dissection of adherent ligament compressing the thecal sac and wide decompression of all neural elements. A thorough discectomy was initially performed on the left with preparation of the endplates for grafting a trial spacer was placed this level and a thorough discectomy was performed on the right as well. Bone autograft was packed within the interspace bilaterally along with small BMP kit and NexOss bone graft extender. Bilateral median 10 mm peek cages were packed with BMP and extender and was inserted the interspace and countersunk appropriately along with 6 cc of morselized bone autograft. The posterolateral region was extensively decorticated and pedicle probes were placed at L4 and L5 bilaterally. Intraoperative fluoroscopy confirmed correct orientationin the AP and lateral plane. 40 x 6.5 mm pedicle screws were placed at L5 bilaterally and 45 x 6.5 mm screws placed at L4 bilaterally final x-rays demonstrated well-positioned interbody grafts and pedicle screw fixation. A 35 mm lordotic rod was placed on the right and a 35 mm rod was placed on the left locked down in compressionand the posterolateral region was packed with the remaining autograft and bone graft extender bilaterally. The wound was irrigated and a medium Hemovac drain was placed in the epidural space. Fascia was closed with 1 Vicryl sutures skin edges were reapproximated 2 and 3-0 Vicryl sutures. The wound is dressed with benzoin Steri-Strips Telfa gauze and tape the patient was extubated in the operating room and taken to recovery in stable  satisfactory condition. She tolerated the operation well counts were correct at the end of the case.   PLAN OF CARE: Admit to inpatient    PATIENT DISPOSITION:  PACU - hemodynamically stable.   Delay start of Pharmacological VTE agent (>24hrs) due to surgical blood loss or risk of bleeding: yes

## 2012-12-14 NOTE — Preoperative (Signed)
Beta Blockers   Reason not to administer Beta Blockers:Not Applicable 

## 2012-12-14 NOTE — Anesthesia Preprocedure Evaluation (Addendum)
Anesthesia Evaluation  Patient identified by MRN, date of birth, ID band Patient awake    Reviewed: Allergy & Precautions, H&P , NPO status , Patient's Chart, lab work & pertinent test results  History of Anesthesia Complications (+) PONV  Airway Mallampati: II TM Distance: >3 FB Neck ROM: Full    Dental  (+) Teeth Intact and Dental Advisory Given   Pulmonary          Cardiovascular + dysrhythmias     Neuro/Psych  Headaches,    GI/Hepatic   Endo/Other    Renal/GU      Musculoskeletal  (+) Arthritis -,   Abdominal   Peds  Hematology   Anesthesia Other Findings   Reproductive/Obstetrics                           Anesthesia Physical Anesthesia Plan  ASA: I  Anesthesia Plan: General   Post-op Pain Management:    Induction: Intravenous  Airway Management Planned: Oral ETT  Additional Equipment:   Intra-op Plan:   Post-operative Plan: Extubation in OR  Informed Consent: I have reviewed the patients History and Physical, chart, labs and discussed the procedure including the risks, benefits and alternatives for the proposed anesthesia with the patient or authorized representative who has indicated his/her understanding and acceptance.   Dental advisory given  Plan Discussed with: Anesthesiologist and Surgeon  Anesthesia Plan Comments:        Anesthesia Quick Evaluation

## 2012-12-14 NOTE — Progress Notes (Signed)
Pt was told by PAT nurse yesterday that she could drink a protein shake at 5 am today because her surgery was scheduled for 5 pm today. Pt states she did drink it at 5 am, but surgery has been moved to 1 pm.  I spoke with Dr. Katrinka Blazing, Anesthesiology and he states that is fine.  Also spoke with Dr. Katrinka Blazing earlier about pt not taking her Atenolol today because of hx of "severe" hypotension while in recovery.  Her BP here is 99/55 and pulse is 74.  Dr. Katrinka Blazing stated pt was correct in not taking the med.  Will not give here.

## 2012-12-14 NOTE — Anesthesia Procedure Notes (Signed)
Procedure Name: Intubation Date/Time: 12/14/2012 1:35 PM Performed by: Gayla Medicus Pre-anesthesia Checklist: Patient identified, Timeout performed, Emergency Drugs available, Suction available and Patient being monitored Patient Re-evaluated:Patient Re-evaluated prior to inductionOxygen Delivery Method: Circle system utilized Preoxygenation: Pre-oxygenation with 100% oxygen Intubation Type: IV induction Ventilation: Mask ventilation without difficulty Laryngoscope Size: Mac and 3 Grade View: Grade III Tube type: Oral Tube size: 7.5 mm Number of attempts: 2 Airway Equipment and Method: Stylet and Bougie stylet Placement Confirmation: ETT inserted through vocal cords under direct vision,  positive ETCO2 and breath sounds checked- equal and bilateral Secured at: 22 cm Tube secured with: Tape Dental Injury: Teeth and Oropharynx as per pre-operative assessment  Comments: Esophageal intubation first attempt; Second attempt successful with use of bougie stylet. Both attempts atraumatic, VSS throughout.

## 2012-12-14 NOTE — Transfer of Care (Signed)
Immediate Anesthesia Transfer of Care Note  Patient: Michelle Martinez  Procedure(s) Performed: Procedure(s) with comments: POSTERIOR LUMBAR FUSION 1 LEVEL (N/A) - Lumbar four-five Decompression/Fusion  Patient Location: PACU  Anesthesia Type:General  Level of Consciousness: awake, alert  and oriented  Airway & Oxygen Therapy: Patient Spontanous Breathing and Patient connected to nasal cannula oxygen  Post-op Assessment: Report given to PACU RN, Post -op Vital signs reviewed and stable and Patient moving all extremities X 4  Post vital signs: Reviewed and stable  Complications: No apparent anesthesia complications

## 2012-12-15 ENCOUNTER — Encounter (HOSPITAL_COMMUNITY): Payer: Self-pay | Admitting: *Deleted

## 2012-12-15 HISTORY — PX: LUMBAR FUSION: SHX111

## 2012-12-15 MED ORDER — PANTOPRAZOLE SODIUM 40 MG PO TBEC
40.0000 mg | DELAYED_RELEASE_TABLET | Freq: Every day | ORAL | Status: DC
  Administered 2012-12-15 – 2012-12-17 (×2): 40 mg via ORAL
  Filled 2012-12-15 (×4): qty 1

## 2012-12-15 MED ORDER — HYDROMORPHONE HCL PF 1 MG/ML IJ SOLN
INTRAMUSCULAR | Status: AC
  Filled 2012-12-15: qty 1

## 2012-12-15 MED ORDER — HYDROMORPHONE HCL PF 1 MG/ML IJ SOLN
0.5000 mg | INTRAMUSCULAR | Status: DC | PRN
  Administered 2012-12-15 – 2012-12-16 (×4): 0.5 mg via INTRAVENOUS
  Filled 2012-12-15 (×3): qty 1

## 2012-12-15 MED ORDER — SODIUM CHLORIDE 0.9 % IV SOLN
Freq: Once | INTRAVENOUS | Status: AC
  Administered 2012-12-16: 01:00:00 via INTRAVENOUS

## 2012-12-15 NOTE — Evaluation (Signed)
Physical Therapy Evaluation Patient Details Name: Michelle Martinez MRN: [id number] DOB: [birth year] Today's Date: 12/15/2012 Time: 4098-1191 PT Time Calculation (min): 22 min  PT Assessment / Plan / Recommendation Clinical Impression  Patient is a 62 y/o female admitted for Lumbar four-five Decompression/Fusion with PEEK interbody cages, pedicle screw fixation and posterolateral arthrodesis L 45 due to stenosis, listhesis, pain and rediculopathy.  She presents with decreased mobility due to acute pain, LE weakness, and precautions.  She will benefit from skilled PT in the acute setting to improve mobility and awareness of precautions to allow d/c home with spouse assist and HHPT.    PT Assessment  Patient needs continued PT services    Follow Up Recommendations  Home health PT          Equipment Recommendations  None recommended by PT       Frequency Min 6X/week    Precautions / Restrictions Precautions Precautions: Back Precaution Booklet Issued: No Precaution Comments: educated in lifting restriction and no bending, arching, twisting Required Braces or Orthoses: Spinal Brace Spinal Brace: Applied in sitting position;Lumbar corset (no brace ordered, yet in room)   Pertinent Vitals/Pain 7/10 in back and right hip; RN aware      Mobility  Bed Mobility Bed Mobility: Rolling Left;Left Sidelying to Sit;Sit to Sidelying Left Rolling Left: 5: Supervision;With rail Left Sidelying to Sit: 5: Supervision;With rails;HOB flat Sit to Sidelying Left: 5: Supervision;HOB flat;With rail Details for Bed Mobility Assistance: with cues for technique and heavy reliance on rail Transfers Transfers: Sit to Stand;Stand to Sit Sit to Stand: 4: Min guard;From bed;With upper extremity assist Stand to Sit: 4: Min guard;To bed;With upper extremity assist Details for Transfer Assistance: cues for hand placement Ambulation/Gait Ambulation/Gait Assistance: 4: Min assist Ambulation Distance  (Feet): 95 Feet Assistive device: Rolling walker Ambulation/Gait Assistance Details: very stiff with short step length and cues for step length, relaxed shoulders, and breathing; assist for walker with turns and at times forward propulsion due to weakness; c/o right hip pain with ambulation Gait Pattern: Step-through pattern;Decreased stride length;Decreased trunk rotation Gait velocity: slow        PT Diagnosis: Acute pain;Abnormality of gait;Generalized weakness  PT Problem List: Decreased strength;Pain;Decreased activity tolerance;Decreased knowledge of precautions;Decreased mobility;Decreased knowledge of use of DME PT Treatment Interventions: DME instruction;Gait training;Stair training;Patient/family education;Functional mobility training;Therapeutic activities;Therapeutic exercise;Neuromuscular re-education;Balance training   PT Goals Acute Rehab PT Goals PT Goal Formulation: With patient Time For Goal Achievement: 12/24/12 Potential to Achieve Goals: Good Pt will Roll Supine to Left Side: Independently PT Goal: Rolling Supine to Left Side - Progress: Goal set today Pt will go Supine/Side to Sit: with modified independence;with HOB 0 degrees PT Goal: Supine/Side to Sit - Progress: Goal set today Pt will go Sit to Stand: with modified independence;with upper extremity assist PT Goal: Sit to Stand - Progress: Goal set today Pt will Ambulate: >150 feet;with least restrictive assistive device;with modified independence PT Goal: Ambulate - Progress: Goal set today Pt will Go Up / Down Stairs: 1-2 stairs;with least restrictive assistive device;with modified independence PT Goal: Up/Down Stairs - Progress: Goal set today Additional Goals Additional Goal #1: Demonstrate and verbalize understanding of back precautions. PT Goal: Additional Goal #1 - Progress: Goal set today  Visit Information  Last PT Received On: 12/15/12 Assistance Needed: +1    Subjective Data  Subjective: I just  got back walked to bathroom Patient Stated Goal: To return to skiing   Prior Functioning  Home Living Lives With: Spouse  Available Help at Discharge: Family;Available 24 hours/day Type of Home: House Home Access: Stairs to enter [employer] of Steps: 1 Entrance Stairs-Rails: None Home Layout: One level Bathroom Shower/Tub: Engineer, manufacturing systems: Standard Home Adaptive Equipment: Environmental consultant - rolling Prior Function Level of Independence: Independent Able to Take Stairs?: Yes Driving: Yes Vocation: Retired Musician: No difficulties    Copywriter, advertising Overall Cognitive Status: Appears within functional limits for tasks assessed/performed Arousal/Alertness: Awake/alert Orientation Level: Appears intact for tasks assessed Behavior During Session: [facility] for tasks performed    Extremity/Trunk Assessment Right Lower Extremity Assessment RLE ROM/Strength/Tone: Deficits RLE ROM/Strength/Tone Deficits: AROM WFL; strength hip flexion 3+/5, knee extension 4-/5 flexion 3+/5, ankle DF 4-/5 RLE Sensation: WFL - Light Touch Left Lower Extremity Assessment LLE ROM/Strength/Tone: Deficits LLE ROM/Strength/Tone Deficits: AROM WFL; strength hip flexion 3+/5, knee extension 4-/5 flexion 3+/5, ankle DF 4-/5 LLE Sensation: WFL - Light Touch   Balance Balance Balance Assessed: Yes Static Sitting Balance Static Sitting - Balance Support: Feet supported;No upper extremity supported Static Sitting - Level of Assistance: 6: Modified independent (Device/Increase time) Static Sitting - Comment/# of Minutes: sitting taking off back brace  End of Session PT - End of Session Equipment Utilized During Treatment: Back brace Activity Tolerance: Patient limited by pain Patient left: in bed;with family/visitor present Nurse Communication: Patient requests pain meds  GP     [facility] 12/15/2012, 9:32 AM  Sheran Lawless, PT [phone] 12/15/2012

## 2012-12-15 NOTE — Consult Note (Cosign Needed)
Michelle Wilson EdD 

## 2012-12-15 NOTE — Clinical Social Work Note (Signed)
Clinical Social Work   CSW received consult for SNF. CSW reviewed chart and discussed pt with RN during progression. PT is recommending HHPT. RNCM is aware and following. CSW is signing off, as no further needs identified. Please reconsult if a need arises prior to discharge.   Dede Query, MSW, Theresia Majors [phone]

## 2012-12-15 NOTE — Progress Notes (Signed)
OT Cancellation Note  Patient Details Name: Michelle Martinez MRN: [id number] DOB: [birth year]   Cancelled Treatment:    Reason Eval/Treat Not Completed: Fatigue/lethargy limiting ability to participate. Pt resting comfortably, family member asked OT to reattempt later.  Will continue to follow.  Evern Bio 12/15/2012, 11:47 AM [phone]

## 2012-12-15 NOTE — Progress Notes (Signed)
Subjective: Patient reports doing well.  Right leg much improved  Objective: Vital signs in last 24 hours: Temp:  [97.6 F (36.4 C)-98.4 F (36.9 C)] 98 F (36.7 C) (02/26 0622) Pulse Rate:  [74-118] 83 (02/26 0622) Resp:  [11-22] 11 (02/26 0622) BP: (90-124)/(49-69) 95/49 mmHg (02/26 0622) SpO2:  [94 %-100 %] 94 % (02/26 0622)  Intake/Output from previous day: 02/25 0701 - 02/26 0700 In: 2275 [I.V.:1900; Blood:125; IV Piggyback:250] Out: 2510 [Urine:1950; Drains:160; Blood:400] Intake/Output this shift:    Physical Exam: Full strength, no numbness.  Dressing CDI  Lab Results: No results found for this basename: WBC, HGB, HCT, PLT,  in the last 72 hours BMET No results found for this basename: NA, K, CL, CO2, GLUCOSE, BUN, CREATININE, CALCIUM,  in the last 72 hours  Studies/Results: Dg Lumbar Spine 2-3 Views  12/14/2012  *RADIOLOGY REPORT*  Clinical Data: PLIF L4 - L5  DG C-ARM 1-60 MIN,LUMBAR SPINE - 2-3 VIEW  Comparison:  Lumbar spine radiographs - earlier same day; 11/24/2012; lumbar spine MRI - 11/02/2012  Findings:  Lumbar spine labeling is in keeping with preprocedural lumbar spine MRI.  Three spot intraoperative radiographic images of the lumbar spine are provided for review.  Images demonstrate pedicular screws seen at L4 and L5 bilaterally. Post L4 - L5 intervertebral disc space replacement with associated restoration of the L4 - L5 intervertebral disc space height.  A minimal amount of residual anterolisthesis of L4 upon L5 persists.  Additional support apparatus is seen posterior to the operative site.  IMPRESSION: Post L4 - L5 PLIF as above.   Original Report Authenticated By: Tacey Ruiz, MD    Dg Lumbar Spine 1 View  12/14/2012  *RADIOLOGY REPORT*  Clinical Data: PLIF L4-L5.  LUMBAR SPINE - 1 VIEW  Comparison: 11/24/2012.  Findings: Intraoperative localization is performed.  Soft tissue retractors are dorsal to the L4-L5 interspace.  Probes overlie the dorsal soft  tissues at L4 and L5.  IMPRESSION: Intraoperative localization as above.   Original Report Authenticated By: Andreas Newport, M.D.    Dg C-arm 1-60 Min  12/14/2012  *RADIOLOGY REPORT*  Clinical Data: PLIF L4 - L5  DG C-ARM 1-60 MIN,LUMBAR SPINE - 2-3 VIEW  Comparison:  Lumbar spine radiographs - earlier same day; 11/24/2012; lumbar spine MRI - 11/02/2012  Findings:  Lumbar spine labeling is in keeping with preprocedural lumbar spine MRI.  Three spot intraoperative radiographic images of the lumbar spine are provided for review.  Images demonstrate pedicular screws seen at L4 and L5 bilaterally. Post L4 - L5 intervertebral disc space replacement with associated restoration of the L4 - L5 intervertebral disc space height.  A minimal amount of residual anterolisthesis of L4 upon L5 persists.  Additional support apparatus is seen posterior to the operative site.  IMPRESSION: Post L4 - L5 PLIF as above.   Original Report Authenticated By: Tacey Ruiz, MD     Assessment/Plan: Doing well.  D/C PCA.  Mobilize with PT.  Anticipate D/C in am.    LOS: 1 day    Dorian Heckle, MD 12/15/2012, 8:04 AM

## 2012-12-15 NOTE — Progress Notes (Signed)
Pt ambulated in the room with brace, front-wheel walker, and RN assist.  Pt had fairly steady gait and slow pace.  Pt HR elevated to 122 while ambulating and pt had slight tremors in arms and legs.  Pt back to bed without incidence, HR back WNL, and tremors subsided.  Will continue to monitor closely.

## 2012-12-15 NOTE — Progress Notes (Signed)
Marikay Alar called  At 2300 regarding pt's BP 83/41. Pulse 80. Pt is sleepy, but complains of sudden headache. Claims she is drinking fluids. Order to give NS 1 liter over two hours to help increase BP. Will continue to monitor pt's pain and BP. Salvadore Oxford, RN 12/15/12 [phone]

## 2012-12-16 MED ORDER — PROMETHAZINE HCL 25 MG/ML IJ SOLN
6.2500 mg | INTRAMUSCULAR | Status: DC | PRN
  Administered 2012-12-16: 6.25 mg via INTRAVENOUS
  Filled 2012-12-16: qty 1

## 2012-12-16 MED ORDER — FLEET ENEMA 7-19 GM/118ML RE ENEM: 1.0000 | ENEMA | Freq: Every day | RECTAL | Status: DC | PRN

## 2012-12-16 MED FILL — Sodium Chloride Irrigation Soln 0.9%: Qty: 3000 | Status: AC

## 2012-12-16 MED FILL — Heparin Sodium (Porcine) Inj 1000 Unit/ML: INTRAMUSCULAR | Qty: 30 | Status: AC

## 2012-12-16 MED FILL — Sodium Chloride IV Soln 0.9%: INTRAVENOUS | Qty: 1000 | Status: AC

## 2012-12-16 NOTE — Progress Notes (Signed)
Subjective: Patient reports "Michelle Martinez was just awful- pain- and then my blood pressure dropped last night."  Objective: Vital signs in last 24 hours: Temp:  [98 F (36.7 C)-99.5 F (37.5 C)] 99.5 F (37.5 C) (02/27 0616) Pulse Rate:  [76-107] 92 (02/27 0616) Resp:  [14-18] 18 (02/27 0616) BP: (83-99)/(41-68) 89/51 mmHg (02/27 0616) SpO2:  [94 %-98 %] 94 % (02/27 0616) Weight:  [56.246 kg (124 lb)] 56.246 kg (124 lb) (02/26 2100)  Intake/Output from previous day: 02/26 0701 - 02/27 0700 In: -  Out: 625 [Urine:600; Drains:25] Intake/Output this shift:    Awakens to voice. Reports no pain at present, but notes pain was severe yesterday and sleep was poor. I&O cath during night.  NS iv initiated overnight for BP. No BM yet.  Good strength BLE. drsg intact. Incision with Dermabond. No erythema, swelling, or drainage.   Lab Results: No results found for this basename: WBC, HGB, HCT, PLT,  in the last 72 hours BMET No results found for this basename: NA, K, CL, CO2, GLUCOSE, BUN, CREATININE, CALCIUM,  in the last 72 hours  Studies/Results: Dg Lumbar Spine 2-3 Views  12/14/2012  *RADIOLOGY REPORT*  Clinical Data: PLIF L4 - L5  DG C-ARM 1-60 MIN,LUMBAR SPINE - 2-3 VIEW  Comparison:  Lumbar spine radiographs - earlier same day; 11/24/2012; lumbar spine MRI - 11/02/2012  Findings:  Lumbar spine labeling is in keeping with preprocedural lumbar spine MRI.  Three spot intraoperative radiographic images of the lumbar spine are provided for review.  Images demonstrate pedicular screws seen at L4 and L5 bilaterally. Post L4 - L5 intervertebral disc space replacement with associated restoration of the L4 - L5 intervertebral disc space height.  A minimal amount of residual anterolisthesis of L4 upon L5 persists.  Additional support apparatus is seen posterior to the operative site.  IMPRESSION: Post L4 - L5 PLIF as above.   Original Report Authenticated By: Tacey Ruiz, MD    Dg Lumbar Spine 1  View  12/14/2012  *RADIOLOGY REPORT*  Clinical Data: PLIF L4-L5.  LUMBAR SPINE - 1 VIEW  Comparison: 11/24/2012.  Findings: Intraoperative localization is performed.  Soft tissue retractors are dorsal to the L4-L5 interspace.  Probes overlie the dorsal soft tissues at L4 and L5.  IMPRESSION: Intraoperative localization as above.   Original Report Authenticated By: Andreas Newport, M.D.    Dg C-arm 1-60 Min  12/14/2012  *RADIOLOGY REPORT*  Clinical Data: PLIF L4 - L5  DG C-ARM 1-60 MIN,LUMBAR SPINE - 2-3 VIEW  Comparison:  Lumbar spine radiographs - earlier same day; 11/24/2012; lumbar spine MRI - 11/02/2012  Findings:  Lumbar spine labeling is in keeping with preprocedural lumbar spine MRI.  Three spot intraoperative radiographic images of the lumbar spine are provided for review.  Images demonstrate pedicular screws seen at L4 and L5 bilaterally. Post L4 - L5 intervertebral disc space replacement with associated restoration of the L4 - L5 intervertebral disc space height.  A minimal amount of residual anterolisthesis of L4 upon L5 persists.  Additional support apparatus is seen posterior to the operative site.  IMPRESSION: Post L4 - L5 PLIF as above.   Original Report Authenticated By: Tacey Ruiz, MD     Assessment/Plan: Monitoring BP & urinary retention.   LOS: 2 days  Will continue NS for now, monitoring voiding, work on bowels and pain control. Continue to hold BP med for hypotension.   Georgiann Cocker 12/16/2012, 7:42 AM

## 2012-12-16 NOTE — Progress Notes (Signed)
PT Cancellation Note  Patient Details Name: Michelle Martinez MRN: [id number] DOB: 1951-02-17   Cancelled Treatment:    Reason Eval/Treat Not Completed: Pain limiting ability to participate. Attempted x2. Patient with migraine and sleeping at this time   Fredrich Birks 12/16/2012, 1:19 PM 12/16/2012 Fredrich Birks PTA [phone] pager [phone] office

## 2012-12-16 NOTE — Progress Notes (Signed)
Patient had rough night, progressing slowly

## 2012-12-16 NOTE — Progress Notes (Signed)
OT Cancellation Note  Patient Details Name: Michelle Martinez MRN: [id number] DOB: [birth year]   Cancelled Treatment:    Reason Eval/Treat Not Completed: Fatigue/lethargy limiting ability to participate. Pt sleeping after being given pain meds.  Will re-attempt.  Evern Bio 12/16/2012, 11:17 AM

## 2012-12-16 NOTE — Progress Notes (Signed)
Dr. Phoebe Perch called regarding pt's headache. Pt states that when sitting or walking pain is a 10/10 and radiates down her neck to her shoulders. When lying flat pain decreases to 6/10. MD ordered to keep pt lying flat if improves pt's pain. Dr. Phoebe Perch also made aware of pt's BP 89/51, pulse 103. Pt states she has not been able to eat much all day because of nausea. Getting Zofran PRN. MD ordered 6.25 mg of Phenergan IV to help with nausea. Will continue to monitor pt's head, BP, and nausea. Salvadore Oxford, RN 12/16/12 2021

## 2012-12-17 NOTE — Progress Notes (Signed)
[facility]  Patient Status: New  [facility] is providing the following services: PT  If patient discharges after hours, please call [phone].   Wynelle Bourgeois 12/17/2012, 12:49 PM

## 2012-12-17 NOTE — Progress Notes (Signed)
Patient progressing.  Possibly D/C this afternoon or in AM.

## 2012-12-17 NOTE — Progress Notes (Signed)
Less nausea.  Headache resolved.  Currently resting.  D/C home in am if doing well.

## 2012-12-17 NOTE — Progress Notes (Signed)
Pt's BP 80/52 pulse 90 at 0200. Pt denies feeling lightheaded, nauseous, or dizzy. Headache controlled at this time. NS at 75cc's/hr. Dsg dry and intact, no shadowing. Will continue to hold pain meds and monitor pt for hypotensive symptoms.  Salvadore Oxford, RN 12/17/12 [phone]

## 2012-12-17 NOTE — Evaluation (Signed)
Occupational Therapy Evaluation Patient Details Name: Michelle Martinez MRN: [id number] DOB: November 12, 1950 Today's Date: 12/17/2012 Time: 7829-5621 OT Time Calculation (min): 23 min  OT Assessment / Plan / Recommendation Clinical Impression  Pt is 62 y/o female, s/p L4-5 lumbar decompression/fusion, she has back precautions and needs to don corset in sitting. Pt currently Min A LB ADL's. She plans to go home w/ spouse/family assist. Recommend acute OT and 3:1. ?Try tub transfers next visit.    OT Assessment  Patient needs continued OT Services    Follow Up Recommendations  No OT follow up    Barriers to Discharge      Equipment Recommendations  3 in 1 bedside comode    Recommendations for Other Services    Frequency  Min 3X/week    Precautions / Restrictions Precautions Precautions: Back Precaution Comments: educated on lifting restriction and no bending, arching, twisting. Examples of ADL's reviewed Required Braces or Orthoses: Spinal Brace Spinal Brace: Applied in sitting position;Lumbar corset (no brace ordered, yet in room) Restrictions Weight Bearing Restrictions: No   Pertinent Vitals/Pain 5/10 low back pain, RN aware and gave medication. Pt also agreeable to functional activity and repositioning.    ADL  Eating/Feeding: Performed;Modified independent Where Assessed - Eating/Feeding: Bed level Grooming: Performed;Wash/dry hands;Wash/dry face;Supervision/safety Where Assessed - Grooming: Unsupported standing Upper Body Bathing: Simulated;Set up;Supervision/safety Where Assessed - Upper Body Bathing: Supported sitting Lower Body Bathing: Simulated;Minimal assistance Where Assessed - Lower Body Bathing: Supported sitting Upper Body Dressing: Simulated;Min guard;Supervision/safety Where Assessed - Upper Body Dressing: Supported sitting Lower Body Dressing: Performed;Supervision/safety;Minimal assistance Where Assessed - Lower Body Dressing: Unsupported sit to  stand;Unsupported sitting Toilet Transfer: Research scientist (life sciences) Method: Sit to Barista: Comfort height toilet;Grab bars Toileting - Architect and Hygiene: Performed;Supervision/safety Where Assessed - Engineer, mining and Hygiene: Sit on 3-in-1 or toilet Tub/Shower Transfer Method: Not assessed Equipment Used: Back brace;Gait belt;Rolling walker Transfers/Ambulation Related to ADLs: Pt overall supervision level during functional mobility related to ADL's. Reviewed back precautions w/ pt and gave verbal examples of how to perform at home. Pt/spouse verbalized understanding of this. ADL Comments: Pt is 62 y/o female s/p L4-5 lumbar decompression/fusion, she has back precautions and needs to don corset in sitting. Pt currently Min A LB ADL's. She plans to go home w/ spouse/family assist. Recommend acute OT and 3:1.     OT Diagnosis: Generalized weakness;Acute pain  OT Problem List: Decreased activity tolerance;Decreased knowledge of use of DME or AE;Decreased knowledge of precautions;Pain OT Treatment Interventions: Self-care/ADL training;Energy conservation;DME and/or AE instruction;Patient/family education;Therapeutic activities   OT Goals Acute Rehab OT Goals OT Goal Formulation: With patient Potential to Achieve Goals: Good ADL Goals Pt Will Perform Upper Body Dressing: with set-up;Sitting, chair;Sitting, bed;Supported;Unsupported ADL Goal: Upper Body Dressing - Progress: Goal set today Pt Will Perform Lower Body Dressing: with supervision;with set-up;Sit to stand from chair;Sit to stand from bed;Unsupported ADL Goal: Lower Body Dressing - Progress: Goal set today Pt Will Transfer to Toilet: with modified independence;Comfort height toilet;3-in-1;with DME;Maintaining back safety precautions ADL Goal: Toilet Transfer - Progress: Goal set today Pt Will Perform Toileting - Clothing Manipulation: with modified  independence;Standing;Sitting on 3-in-1 or toilet;Other (comment) (Maintaining back precuations) ADL Goal: Toileting - Clothing Manipulation - Progress: Goal set today Pt Will Perform Toileting - Hygiene: with modified independence;Sitting on 3-in-1 or toilet ADL Goal: Toileting - Hygiene - Progress: Goal set today Pt Will Perform Tub/Shower Transfer: Tub transfer;with min assist;with DME;Ambulation;Maintaining back safety precautions ADL Goal: Tub/Shower  Transfer - Progress: Goal set today  Visit Information  Last OT Received On: 12/17/12 Assistance Needed: +1    Subjective Data  Subjective: I'd like to get back to bed Patient Stated Goal: Home w/ family PRN 24 hour assist   Prior Functioning     Home Living Lives With: Spouse Available Help at Discharge: Family;Available 24 hours/day Type of Home: House Home Access: Stairs to enter [employer] of Steps: 1 Entrance Stairs-Rails: None Home Layout: One level Bathroom Shower/Tub: Engineer, manufacturing systems: Standard Home Adaptive Equipment: Environmental consultant - rolling Prior Function Level of Independence: Independent Able to Take Stairs?: Yes Driving: Yes Vocation: Retired Musician: No difficulties Dominant Hand: Right    Vision/Perception Vision - History Patient Visual Report: No change from baseline   Cognition  Cognition Overall Cognitive Status: Appears within functional limits for tasks assessed/performed Arousal/Alertness: Awake/alert Orientation Level: Appears intact for tasks assessed Behavior During Session: [facility] for tasks performed    Extremity/Trunk Assessment Right Upper Extremity Assessment RUE ROM/Strength/Tone: WFL for tasks assessed RUE Coordination: WFL - gross/fine motor Left Upper Extremity Assessment LUE ROM/Strength/Tone: WFL for tasks assessed LUE Coordination: WFL - gross/fine motor Trunk Assessment Trunk Assessment: Normal     Mobility Bed Mobility Bed Mobility:  Sit to Sidelying Left;Sit to Supine;Rolling Left Rolling Left: 6: Modified independent (Device/Increase time) Sit to Supine: 5: Supervision;HOB flat Sit to Sidelying Left: HOB flat;5: Supervision Details for Bed Mobility Assistance: with cues for technique Transfers Transfers: Sit to Stand;Stand to Sit Sit to Stand: From bed;5: Supervision;Without upper extremity assist;From chair/3-in-1;From toilet Stand to Sit: To bed;5: Supervision;Without upper extremity assist;To chair/3-in-1 Details for Transfer Assistance: cues for hand placement        Balance Balance Balance Assessed: Yes Static Sitting Balance Static Sitting - Balance Support: Feet supported;No upper extremity supported Static Sitting - Level of Assistance: 6: Modified independent (Device/Increase time)   End of Session OT - End of Session Equipment Utilized During Treatment: Gait belt;Back brace;Other (comment) (RW, handicapped height toilet, grab bar) Activity Tolerance: Patient tolerated treatment well Patient left: in bed;with call bell/phone within reach;with family/visitor present  GO     Roselie Awkward Dixon 12/17/2012, 12:11 PM

## 2012-12-17 NOTE — Progress Notes (Signed)
Subjective: Patient reports "I do feel better right now. My head still hurts a little, but I just went to the bathroom." "my blood pressure normally runs in the 90's"  Objective: Vital signs in last 24 hours: Temp:  [98.6 F (37 C)-100.1 F (37.8 C)] 99.3 F (37.4 C) (02/28 0545) Pulse Rate:  [84-107] 84 (02/28 0545) Resp:  [18-20] 20 (02/28 0545) BP: (76-101)/(38-74) 86/38 mmHg (02/28 0545) SpO2:  [95 %-99 %] 97 % (02/28 0545)  Intake/Output from previous day: 02/27 0701 - 02/28 0700 In: -  Out: 2600 [Urine:2600] Intake/Output this shift:    Alert, conversant. Reports feeling well now. Has been having lumbar spasms & h/a with activity. Incision flat, without erythema or drainage. Dermabond. Small BM this morning. Good strength BLE.  Lab Results: No results found for this basename: WBC, HGB, HCT, PLT,  in the last 72 hours BMET No results found for this basename: NA, K, CL, CO2, GLUCOSE, BUN, CREATININE, CALCIUM,  in the last 72 hours  Studies/Results: No results found.  Assessment/Plan: Improving slowly   LOS: 3 days  Reassured re: lumbar spasms.  Will monitor h/a: appears to be r/t muscle spasms. Continue to mobilize in LSO. Asymptomatic with systolics in 90's (pt states her normal).   Georgiann Cocker 12/17/2012, 8:00 AM

## 2012-12-17 NOTE — Progress Notes (Signed)
Physical Therapy Treatment Patient Details Name: TAMULA MORRICAL MRN: [id number] DOB: [birth year] Today's Date: 12/17/2012 Time: 9147-8295 PT Time Calculation (min): 31 min  PT Assessment / Plan / Recommendation Comments on Treatment Session  Pt moving very well.      Follow Up Recommendations  Home health PT     Does the patient have the potential to tolerate intense rehabilitation     Barriers to Discharge        Equipment Recommendations  None recommended by PT    Recommendations for Other Services    Frequency Min 6X/week   Plan Discharge plan remains appropriate    Precautions / Restrictions Precautions Precautions: Back Precaution Comments: Reviewed all 3 back precautions.  Pt able to donn socks sitting EOB while maintaining back precautions   Required Braces or Orthoses: Spinal Brace Spinal Brace: Applied in sitting position;Lumbar corset Restrictions Weight Bearing Restrictions: No   Pertinent Vitals/Pain 6/10 back at end of session.      Mobility  Bed Mobility Bed Mobility: Rolling Left;Left Sidelying to Sit;Sitting - Scoot to Edge of Bed Rolling Left: 5: Supervision Left Sidelying to Sit: 5: Supervision;HOB flat Sit to Supine: 6: Modified independent (Device/Increase time) Sit to Sidelying Left: HOB flat;5: Supervision Details for Bed Mobility Assistance: Cues for use of UE's & min cueing to reinforce back precautions Transfers Transfers: Sit to Stand;Stand to Sit Sit to Stand: With upper extremity assist;From bed;From toilet;4: Min guard Stand to Sit: 5: Supervision;With upper extremity assist;With armrests;To chair/3-in-1;To toilet Details for Transfer Assistance: Cues for safest hand placement Ambulation/Gait Ambulation/Gait Assistance: 4: Min guard Ambulation Distance (Feet): 300 Feet Assistive device: Rolling walker Ambulation/Gait Assistance Details: Encouragement to relax, increase step/stride length, & increase gait speed.   Gait Pattern:  Step-through pattern;Decreased stride length Gait velocity: Guarded & slow Stairs: Yes Stairs Assistance: 4: Min assist Stairs Assistance Details (indicate cue type and reason): (A) provided via HHA for stability.   Stair Management Technique: No rails;Step to pattern;Forwards Number of Stairs: 2 (2x's) Wheelchair Mobility Wheelchair Mobility: No      PT Goals Acute Rehab PT Goals Time For Goal Achievement: 12/24/12 Potential to Achieve Goals: Good Pt will Roll Supine to Left Side: Independently PT Goal: Rolling Supine to Left Side - Progress: Progressing toward goal Pt will go Supine/Side to Sit: with modified independence;with HOB 0 degrees PT Goal: Supine/Side to Sit - Progress: Progressing toward goal Pt will go Sit to Stand: with modified independence;with upper extremity assist PT Goal: Sit to Stand - Progress: Progressing toward goal Pt will Ambulate: >150 feet;with least restrictive assistive device;with modified independence PT Goal: Ambulate - Progress: Progressing toward goal Pt will Go Up / Down Stairs: 1-2 stairs;with least restrictive assistive device;with modified independence PT Goal: Up/Down Stairs - Progress: Progressing toward goal Additional Goals Additional Goal #1: Demonstrate and verbalize understanding of back precautions. PT Goal: Additional Goal #1 - Progress: Progressing toward goal  Visit Information  Last PT Received On: 12/17/12 Assistance Needed: +1    Subjective Data      Cognition  Cognition Overall Cognitive Status: Appears within functional limits for tasks assessed/performed Arousal/Alertness: Awake/alert Orientation Level: Appears intact for tasks assessed Behavior During Session: [facility] for tasks performed    Balance  Balance Balance Assessed: Yes Static Sitting Balance Static Sitting - Balance Support: Feet supported;No upper extremity supported Static Sitting - Level of Assistance: 6: Modified independent (Device/Increase time)   End of Session PT - End of Session Equipment Utilized During Treatment: Gait belt;Back  brace Activity Tolerance: Patient tolerated treatment well Patient left: in chair;with call bell/phone within reach;with family/visitor present Nurse Communication: Mobility status     Verdell Face, [place] [phone] 12/17/2012

## 2012-12-17 NOTE — Care Management Note (Signed)
    Page 1 of 1   12/17/2012     1:09:17 PM   CARE MANAGEMENT NOTE 12/17/2012  Patient:  Michelle Martinez, Michelle Martinez   Account Number:  [id number]  Date Initiated:  12/15/2012  Documentation initiated by:  [facility]  Subjective/Objective Assessment:   admitted postop PLIF L4-5     Action/Plan:   PT/OT evals-recommending HHPT   Anticipated DC Date:  12/18/2012   Anticipated DC Plan:  HOME W HOME HEALTH SERVICES      DC Planning Services  CM consult      Choice offered to / List presented to:  C-1 Patient        HH arranged  HH-2 PT      [facility] agency  [facility]   Status of service:  Completed, signed off Medicare Important Message given?   (If response is "NO", the following Medicare IM given date fields will be blank) Date Medicare IM given:   Date Additional Medicare IM given:    Discharge Disposition:  HOME W HOME HEALTH SERVICES  Per UR Regulation:  Reviewed for med. necessity/level of care/duration of stay  If discussed at Long Length of Stay Meetings, dates discussed:    Comments:  12/17/12 Spoke with patient and her husband about [facility] for HHPT. Patient chose [facility]. Contacted Marie at [employer] and set up HHPT. Patient alreday has a rolling walker. No equipment needs identified. Jacquelynn Cree RN, BSN, CCM

## 2012-12-18 MED ORDER — OXYCODONE-ACETAMINOPHEN 5-325 MG PO TABS: 1.0000 | ORAL_TABLET | ORAL | Status: DC | PRN

## 2012-12-18 MED ORDER — DIAZEPAM 5 MG PO TABS: 5.0000 mg | ORAL_TABLET | Freq: Four times a day (QID) | ORAL | Status: DC | PRN

## 2012-12-18 NOTE — Discharge Summary (Signed)
Physician Discharge Summary  Patient ID: Michelle Martinez MRN: [id number] DOB/AGE: November 05, 1950 62 y.o.  Admit date: 12/14/2012 Discharge date: 12/18/2012  Admission Diagnoses:  Discharge Diagnoses:  Active Problems:   * No active hospital problems. *   Discharged Condition: good  Hospital Course: Patient admitted to the hospital where she underwent uncomplicated L4-5 decompression and fusion for treatment of her degenerative spondylolisthesis with stenosis. Postoperative she is done quite well. Preoperative back and lower extremity pain improved. Ready for discharge home.  Consults:   Significant Diagnostic Studies:   Treatments:   Discharge Exam: Blood pressure 104/53, pulse 92, temperature 99.3 F (37.4 C), temperature source Oral, resp. rate 20, height 5\' 2"  (1.575 m), weight 56.246 kg (124 lb), SpO2 95.00%. Awake and alert. Oriented and appropriate. Cranial nerve function is intact. Motor and sensory function of the extremities normal. Wound clean and dry. Chest and abdomen benign.  Disposition:      Medication List    TAKE these medications       ARTHROTEC 75 75-200 MG-MCG per tablet  Generic drug:  diclofenac-misoprostol  Take 1 tablet by mouth Twice daily.     atenolol 25 MG tablet  Commonly known as:  TENORMIN  Take 1 tablet (25 mg total) by mouth 2 (two) times daily.     atorvastatin 10 MG tablet  Commonly known as:  LIPITOR  Take 1 tablet (10 mg total) by mouth daily.     CALTRATE 600 PO  Take 1 tablet by mouth daily.     CYMBALTA 60 MG capsule  Generic drug:  DULoxetine  Take 1 tablet by mouth Daily.     diazepam 5 MG tablet  Commonly known as:  VALIUM  Take 1-2 tablets (5-10 mg total) by mouth every 6 (six) hours as needed (Muscle spasms).     diphenhydrAMINE 25 mg capsule  Commonly known as:  BENADRYL  Take 25 mg by mouth every 6 (six) hours as needed for itching or allergies.     multivitamin with minerals Tabs  Take 1 tablet by mouth  daily.     oxyCODONE-acetaminophen 5-325 MG per tablet  Commonly known as:  PERCOCET/ROXICET  Take 1-2 tablets by mouth every 4 (four) hours as needed.     pregabalin 50 MG capsule  Commonly known as:  LYRICA  Take 50 mg by mouth 2 (two) times daily as needed (for spinal pain).           Follow-up Information   Follow up with STERN,JOSEPH D, MD. Call in 1 week. (Ask for [facility])    Contact information:   [address] [place] [address] [phone]       Signed: Temple Pacini 12/18/2012, 9:52 AM

## 2012-12-18 NOTE — Progress Notes (Signed)
Physical Therapy Treatment Patient Details Name: Michelle Martinez MRN: [id number] DOB: Aug 07, 1951 Today's Date: 12/18/2012 Time:  -     PT Assessment / Plan / Recommendation Comments on Treatment Session  Pt sleeping upon arrival but agreeable to participate in therapy.   Pt cont's to move well & is appropriate for safe d/c home when MD feels medically ready.      Follow Up Recommendations  Home health PT     Does the patient have the potential to tolerate intense rehabilitation     Barriers to Discharge        Equipment Recommendations  None recommended by PT    Recommendations for Other Services    Frequency Min 6X/week   Plan Discharge plan remains appropriate    Precautions / Restrictions Precautions Precautions: Back Precaution Comments: Pt able to recall 2/3 back precautions.  Reviewed all 3.   Required Braces or Orthoses: Spinal Brace Spinal Brace: Applied in sitting position;Lumbar corset Restrictions Weight Bearing Restrictions: No       Mobility  Bed Mobility Bed Mobility: Rolling Right;Right Sidelying to Sit;Sitting - Scoot to Edge of Bed Rolling Right: 6: Modified independent (Device/Increase time) Right Sidelying to Sit: 6: Modified independent (Device/Increase time);HOB flat Sitting - Scoot to Edge of Bed: 6: Modified independent (Device/Increase time) Transfers Transfers: Sit to Stand;Stand to Sit Sit to Stand: 5: Supervision;With upper extremity assist;From bed;From chair/3-in-1 Stand to Sit: With upper extremity assist;With armrests;5: Supervision;To chair/3-in-1 Details for Transfer Assistance: cues for hand placement Ambulation/Gait Ambulation/Gait Assistance: 5: Supervision Ambulation Distance (Feet): 300 Feet Assistive device: Rolling walker Ambulation/Gait Assistance Details: Focus of gait training was to increase smoothness of pattern, increase gait speed, & stride length.   Gait Pattern: Step-through pattern Stairs: No      PT  Goals Acute Rehab PT Goals Time For Goal Achievement: 12/24/12 Potential to Achieve Goals: Good Pt will Roll Supine to Left Side: Independently Pt will go Supine/Side to Sit: with modified independence;with HOB 0 degrees PT Goal: Supine/Side to Sit - Progress: Met Pt will go Sit to Stand: with modified independence;with upper extremity assist PT Goal: Sit to Stand - Progress: Progressing toward goal Pt will Ambulate: >150 feet;with least restrictive assistive device;with modified independence PT Goal: Ambulate - Progress: Progressing toward goal Pt will Go Up / Down Stairs: 1-2 stairs;with least restrictive assistive device;with modified independence Additional Goals Additional Goal #1: Demonstrate and verbalize understanding of back precautions. PT Goal: Additional Goal #1 - Progress: Progressing toward goal  Visit Information  Last PT Received On: 12/18/12 Assistance Needed: +1    Subjective Data      Cognition  Cognition Overall Cognitive Status: Appears within functional limits for tasks assessed/performed Arousal/Alertness: Awake/alert Orientation Level: Appears intact for tasks assessed Behavior During Session: [facility] for tasks performed    Balance     End of Session PT - End of Session Equipment Utilized During Treatment: Gait belt;Back brace Activity Tolerance: Patient tolerated treatment well Patient left: in chair;with call bell/phone within reach;with family/visitor present;Other (comment) (OT present) Nurse Communication: Mobility status     Verdell Face, [place] [phone] 12/18/2012

## 2012-12-18 NOTE — Progress Notes (Signed)
Occupational Therapy Treatment Patient Details Name: Michelle Martinez MRN: [id number] DOB: Jan 04, 1951 Today's Date: 12/18/2012 Time: 4098-1191 OT Time Calculation (min): 26 min  OT Assessment / Plan / Recommendation Comments on Treatment Session      Follow Up Recommendations  No OT follow up    Barriers to Discharge       Equipment Recommendations  3 in 1 bedside comode    Recommendations for Other Services    Frequency Min 3X/week   Plan      Precautions / Restrictions Precautions Precautions: Back Precaution Comments: able to recal 1/3 precautions, able to complete final 2 with demonstrational cues Required Braces or Orthoses: Spinal Brace Spinal Brace: Applied in sitting position;Lumbar corset Restrictions Weight Bearing Restrictions: No   Pertinent Vitals/Pain 2-3/10 per pt.    ADL  Lower Body Bathing: Other (comment) (reviewed/demo'd LH sponge) Lower Body Dressing: Other (comment) (reviewed/demo'd A/E pt. will not need at d/c) Toilet Transfer: Simulated;Supervision/safety Toilet Transfer Method: Sit to stand Toileting - Clothing Manipulation and Hygiene: Simulated;Supervision/safety Tub/Shower Transfer: Performed;Min guard Web designer Method: Science writer: Information systems manager with back Equipment Used: Back brace;Rolling walker Transfers/Ambulation Related to ADLs: amb. s level from room to gym for tub transfer practice ADL Comments: pt. has family help 24/hr. and a close friend who will be there daily. family is borrowing a Paediatric nurse from church for safety while bathing in shower.  declined a/e as family/friend will assist with all lb dressing/bathing    OT Diagnosis:    OT Problem List:   OT Treatment Interventions:     OT Goals ADL Goals ADL Goal: Lower Body Dressing - Progress: Other (comment) (family to assist at d/c) ADL Goal: Toilet Transfer - Progress: Progressing toward goals ADL Goal: Toileting - Clothing  Manipulation - Progress: Progressing toward goals ADL Goal: Toileting - Hygiene - Progress: Progressing toward goals ADL Goal: Tub/Shower Transfer - Progress: Progressing toward goals  Visit Information  Last OT Received On: 12/18/12 Assistance Needed: +1    Subjective Data  Subjective: "i am going to have a lot of help at home"   Prior Functioning       Cognition  Cognition Overall Cognitive Status: Appears within functional limits for tasks assessed/performed Arousal/Alertness: Awake/alert Orientation Level: Appears intact for tasks assessed Behavior During Session: [facility] for tasks performed    Mobility  Bed Mobility Bed Mobility: Rolling Right;Right Sidelying to Sit;Sitting - Scoot to Edge of Bed Rolling Right: 6: Modified independent (Device/Increase time) Right Sidelying to Sit: 6: Modified independent (Device/Increase time);HOB flat Sitting - Scoot to Edge of Bed: 6: Modified independent (Device/Increase time) Transfers Transfers: Sit to Stand;Stand to Sit Sit to Stand: 5: Supervision;With upper extremity assist;With armrests;From chair/3-in-1 Stand to Sit: 5: Supervision;With upper extremity assist;With armrests;To chair/3-in-1 Details for Transfer Assistance: cues for hand placement    Exercises      Balance     End of Session OT - End of Session Equipment Utilized During Treatment: Back brace Activity Tolerance: Patient tolerated treatment well Patient left: in bed;with call bell/phone within reach;with family/visitor present  GO     Robet Leu 12/18/2012, 12:04 PM

## 2012-12-22 ENCOUNTER — Encounter: Payer: Self-pay | Admitting: Cardiology

## 2013-02-25 ENCOUNTER — Other Ambulatory Visit: Payer: Self-pay | Admitting: Dermatology

## 2013-02-28 ENCOUNTER — Other Ambulatory Visit (INDEPENDENT_AMBULATORY_CARE_PROVIDER_SITE_OTHER): Payer: BC Managed Care – PPO

## 2013-02-28 DIAGNOSIS — E7800 Pure hypercholesterolemia, unspecified: Secondary | ICD-10-CM

## 2013-02-28 LAB — BASIC METABOLIC PANEL
BUN: 18 mg/dL (ref 6–23)
CO2: 26 mEq/L (ref 19–32)
Calcium: 9.3 mg/dL (ref 8.4–10.5)
Chloride: 104 mEq/L (ref 96–112)
Creatinine, Ser: 0.7 mg/dL (ref 0.4–1.2)
GFR: 95.21 mL/min (ref >60.00)
Glucose, Bld: 86 mg/dL (ref 70–99)
Potassium: 4 mEq/L (ref 3.5–5.1)
Sodium: 139 mEq/L (ref 135–145)

## 2013-02-28 LAB — HEPATIC FUNCTION PANEL
ALT: 25 U/L (ref 0–35)
AST: 24 U/L (ref 0–37)
Albumin: 4.3 g/dL (ref 3.5–5.2)
Alkaline Phosphatase: 60 U/L (ref 39–117)
Bilirubin, Direct: 0 mg/dL (ref 0.0–0.3)
Total Bilirubin: 0.2 mg/dL — ABNORMAL LOW (ref 0.3–1.2)
Total Protein: 7 g/dL (ref 6.0–8.3)

## 2013-02-28 LAB — LIPID PANEL
Cholesterol: 222 mg/dL — ABNORMAL HIGH (ref 0–200)
HDL: 71.4 mg/dL (ref >39.00)
Total CHOL/HDL Ratio: 3
Triglycerides: 103 mg/dL (ref 0.0–149.0)
VLDL: 20.6 mg/dL (ref 0.0–40.0)

## 2013-02-28 LAB — LDL CHOLESTEROL, DIRECT: Direct LDL: 146.8 mg/dL

## 2013-02-28 NOTE — Progress Notes (Signed)
Quick Note:  Please make copy of labs for patient visit. ______ 

## 2013-03-02 ENCOUNTER — Encounter: Payer: Self-pay | Admitting: Cardiology

## 2013-03-02 ENCOUNTER — Ambulatory Visit (INDEPENDENT_AMBULATORY_CARE_PROVIDER_SITE_OTHER): Payer: BC Managed Care – PPO | Admitting: Cardiology

## 2013-03-02 VITALS — BP 112/74 | HR 68 | Ht 61.5 in | Wt 123.8 lb

## 2013-03-02 DIAGNOSIS — I951 Orthostatic hypotension: Secondary | ICD-10-CM

## 2013-03-02 DIAGNOSIS — M1990 Unspecified osteoarthritis, unspecified site: Secondary | ICD-10-CM

## 2013-03-02 DIAGNOSIS — E7800 Pure hypercholesterolemia, unspecified: Secondary | ICD-10-CM

## 2013-03-02 DIAGNOSIS — M479 Spondylosis, unspecified: Secondary | ICD-10-CM

## 2013-03-02 HISTORY — DX: Unspecified osteoarthritis, unspecified site: M19.90

## 2013-03-02 HISTORY — DX: Orthostatic hypotension: I95.1

## 2013-03-02 MED ORDER — ATORVASTATIN CALCIUM 20 MG PO TABS: 20.0000 mg | ORAL_TABLET | Freq: Every day | ORAL | Status: DC

## 2013-03-02 NOTE — Assessment & Plan Note (Signed)
No recent symptoms of orthostatic hypotension.  She is trying to maintain a good level of dietary salt intake

## 2013-03-02 NOTE — Patient Instructions (Signed)
INCREASE YOUR LIPITOR TO 20 MG DAILY   Your physician wants you to follow-up in: 6 months with fasting labs (lp/bmet/hfp)  You will receive a reminder letter in the mail two months in advance. If you don't receive a letter, please call our office to schedule the follow-up appointment.

## 2013-03-02 NOTE — Assessment & Plan Note (Signed)
Since we last saw the patient she has had a fusion of L4-L5 by Dr. Venetia Maxon.   this is her third back operation she has previously had cervical spine surgery.

## 2013-03-02 NOTE — Progress Notes (Signed)
Michelle Martinez Date of Birth:  05/26/1951 [facility] [address] [place], [place]  [address] [phone]  Fax   [phone]  HPI: This pleasant 62 year old woman is seen for a six-month followup office visit. She has a past history of orthostatic hypotension and a past history of syncope. She also has a history of familial hypercholesterolemia. His last visit she has been doing well. She's had no further episodes of dizziness or syncope. She is trying to make sure she drinks plenty of fluids. She is no longer having any tachycardia since we put her on atenolol.  That her last visit she was complaining of leg cramps from the Crestor.  We switched her to Lipitor 10 mg daily and her leg cramps have resolved.   Current Outpatient Prescriptions  Medication Sig Dispense Refill  . ARTHROTEC 75 75-200 MG-MCG per tablet Take 1 tablet by mouth Twice daily.      Marland Kitchen atenolol (TENORMIN) 25 MG tablet Take 1 tablet (25 mg total) by mouth 2 (two) times daily.  60 tablet  11  . atorvastatin (LIPITOR) 20 MG tablet Take 1 tablet (20 mg total) by mouth daily.  30 tablet  11  . Calcium Carbonate (CALTRATE 600 PO) Take 1 tablet by mouth daily.       . CYMBALTA 60 MG capsule Take 1 tablet by mouth Daily.      . diphenhydrAMINE (BENADRYL) 25 mg capsule Take 25 mg by mouth every 6 (six) hours as needed for itching or allergies.       . Multiple Vitamin (MULTIVITAMIN WITH MINERALS) TABS Take 1 tablet by mouth daily.      . pregabalin (LYRICA) 50 MG capsule Take 50 mg by mouth 2 (two) times daily as needed (for spinal pain).      . diazepam (VALIUM) 5 MG tablet Take 1-2 tablets (5-10 mg total) by mouth every 6 (six) hours as needed (Muscle spasms).  60 tablet  1  . oxyCODONE-acetaminophen (PERCOCET/ROXICET) 5-325 MG per tablet Take 1-2 tablets by mouth every 4 (four) hours as needed.  80 tablet  0   No current facility-administered medications for this visit.    Allergies  Allergen  Reactions  . Codeine Nausea Only    HALLUCINATE  . Crestor (Rosuvastatin)     Muscle cramps  . Sulfur Swelling    Lips, hands, feet    Patient Active Problem List   Diagnosis Date Noted  . Hypercholesterolemia 03/25/2011    History  Smoking status  . Former Smoker  . Quit date: 10/20/1977  Smokeless tobacco  . Never Used    History  Alcohol Use  . 1.2 oz/week  . 2 Glasses of wine per week    Comment: 2 GLASSES DAILY    Family History  Problem Relation Age of Onset  . Cancer Mother   . Heart attack Father   . Cancer Father     Review of Systems: The patient denies any heat or cold intolerance.  No weight gain or weight loss.  The patient denies headaches or blurry vision.  There is no cough or sputum production.  The patient denies dizziness.  There is no hematuria or hematochezia.  The patient denies any muscle aches or arthritis.  The patient denies any rash.  The patient denies frequent falling or instability.  There is no history of depression or anxiety.  All other systems were reviewed and are negative.   Physical Exam: Filed Vitals:   03/02/13 1150  BP: 112/74  Pulse: 68   the general appearance reveals a well-developed well-nourished woman in no distress.The head and neck exam reveals pupils equal and reactive.  Extraocular movements are full.  There is no scleral icterus.  The mouth and pharynx are normal.  The neck is supple.  The carotids reveal no bruits.  The jugular venous pressure is normal.  The  thyroid is not enlarged.  There is no lymphadenopathy.  The chest is clear to percussion and auscultation.  There are no rales or rhonchi.  Expansion of the chest is symmetrical.  The precordium is quiet.  The first heart sound is normal.  The second heart sound is physiologically split.  There is no murmur gallop rub or click.  There is no abnormal lift or heave.  The abdomen is soft and nontender.  The bowel sounds are normal.  The liver and spleen are not  enlarged.  There are no abdominal masses.  There are no abdominal bruits.  Extremities reveal good pedal pulses.  There is no phlebitis or edema.  There is no cyanosis or clubbing.  Strength is normal and symmetrical in all extremities.  There is no lateralizing weakness.  There are no sensory deficits.  The skin is warm and dry.  There is no rash.      Assessment / Plan: Increase Lipitor up to 20 mg daily.  Return in 6 months for office visit and get fasting lipid panel hepatic function panel and basal metabolic panel  prior to her visit

## 2013-03-02 NOTE — Assessment & Plan Note (Signed)
Her recent lab work was reviewed.  Her Lipitor is not as good since switching to low dose 10 mg Lipitor.  We will increase her Lipitor up to 20 mg daily.   hopefully she will not develop leg cramps on the higher dose.

## 2013-06-14 ENCOUNTER — Other Ambulatory Visit: Payer: Self-pay | Admitting: Dermatology

## 2013-06-22 DIAGNOSIS — M545 Low back pain, unspecified: Secondary | ICD-10-CM

## 2013-06-22 HISTORY — DX: Low back pain, unspecified: M54.50

## 2013-07-29 ENCOUNTER — Encounter: Payer: Self-pay | Admitting: Obstetrics & Gynecology

## 2013-07-29 ENCOUNTER — Ambulatory Visit (INDEPENDENT_AMBULATORY_CARE_PROVIDER_SITE_OTHER): Payer: BC Managed Care – PPO | Admitting: Obstetrics & Gynecology

## 2013-07-29 VITALS — BP 102/60 | HR 64 | Ht 62.5 in | Wt 128.0 lb

## 2013-07-29 DIAGNOSIS — Z23 Encounter for immunization: Secondary | ICD-10-CM

## 2013-07-29 DIAGNOSIS — Z01419 Encounter for gynecological examination (general) (routine) without abnormal findings: Secondary | ICD-10-CM

## 2013-07-29 DIAGNOSIS — M899 Disorder of bone, unspecified: Secondary | ICD-10-CM

## 2013-07-29 DIAGNOSIS — M8580 Other specified disorders of bone density and structure, unspecified site: Secondary | ICD-10-CM

## 2013-07-29 MED ORDER — ESTROGENS, CONJUGATED 0.625 MG/GM VA CREA: TOPICAL_CREAM | VAGINAL | Status: DC

## 2013-07-29 NOTE — Patient Instructions (Signed)

## 2013-07-29 NOTE — Progress Notes (Signed)
Patient ID: Michelle Martinez, female   DOB: May 24, 1951, 62 y.o.   MRN: [id number]  62 y.o. [id number] MarriedCaucasianF here for annual exam.  No vaginal bleeding.  Former pt here now to reestablish care.  One issue she wants to talk about is overactive bladder and vaginal dryness.  No vaginal bleeding.  Used vagifem.  Had migraines.  Was on HRT.  Stopped after mother diagnosed with breast cancer.    No LMP recorded. Patient has had a hysterectomy.          Sexually active: yes  The current method of family planning is status post hysterectomy.    Exercising: yes  Home exercise routine includes walking 1-2 times per day with dog.. Smoker:  Former, quit in 1979   Health Maintenance: Pap:  Over 3 years ago History of abnormal Pap:  no MMG:  10/01/12, Bi-Rads 1: negative Colonoscopy:  2008, due for follow up.  Did with Dr. Queen [place] office.  (Supposed to be 5 yr follow-up).  Pt states she will schedule.  BMD:   5+ years ago, Pattricia Boss [place] TDaP:  ? Over 10 years Screening Labs: PCP, Hb today: declined, Urine today: declined   reports that she quit smoking about 35 years ago. She has never used smokeless tobacco. She reports that she drinks about 1.2 ounces of alcohol per week. She reports that she does not use illicit drugs.  Past Medical History  Diagnosis Date  . Arthritis   . Hyperlipidemia   . Palpitation   . Fatigue   . Complication of anesthesia     HYPOTENSION , DIFFICULTY WAKING UP   . PONV (postoperative nausea and vomiting)   . Syncope     SEES DR BRACKBILL   . Hypotension   . Dysrhythmia     OCC RAPID HEART BEAT  . Headache(784.0)   . Frequency of urination     Past Surgical History  Procedure Laterality Date  . Cesarean section    . Cesarean section    . Total abdominal hysterectomy w/ bilateral salpingoophorectomy    . Neck surgery    . Back surgery      lower  . Abdominal hysterectomy    . Lumbar fusion  12/15/2012    Dr Venetia Maxon    Current Outpatient Prescriptions   Medication Sig Dispense Refill  . ARTHROTEC 75 75-200 MG-MCG per tablet Take 1 tablet by mouth Twice daily.      Marland Kitchen atenolol (TENORMIN) 25 MG tablet Take 1 tablet (25 mg total) by mouth 2 (two) times daily.  60 tablet  11  . atorvastatin (LIPITOR) 20 MG tablet Take 10 mg by mouth daily.      . Calcium Carbonate (CALTRATE 600 PO) Take 1 tablet by mouth daily.       . CYMBALTA 60 MG capsule Take 1 tablet by mouth Daily.      . diphenhydrAMINE (BENADRYL) 25 mg capsule Take 25 mg by mouth every 6 (six) hours as needed for itching or allergies.       . methocarbamol (ROBAXIN) 500 MG tablet Take 0.5 tablets by mouth at bedtime as needed.      . Multiple Vitamin (MULTIVITAMIN WITH MINERALS) TABS Take 1 tablet by mouth daily.       No current facility-administered medications for this visit.    Family History  Problem Relation Age of Onset  . Cancer Mother   . Heart attack Father   . Cancer Father     ROS:  Pertinent  items are noted in HPI.  Otherwise, a comprehensive ROS was negative.  Exam:   BP 102/60  Pulse 64  Ht 5' 2.5" (1.588 m)  Wt 128 lb (58.06 kg)  BMI 23.02 kg/m2  Height: 5' 2.5" (158.8 cm)  Ht Readings from Last 3 Encounters:  07/29/13 5' 2.5" (1.588 m)  03/02/13 5' 1.5" (1.562 m)  12/15/12 5\' 2"  (1.575 m)    General appearance: alert, cooperative and appears stated age Head: Normocephalic, without obvious abnormality, atraumatic Neck: no adenopathy, supple, symmetrical, trachea midline and thyroid normal to inspection and palpation Lungs: clear to auscultation bilaterally Breasts: normal appearance, no masses or tenderness Heart: regular rate and rhythm Abdomen: soft, non-tender; bowel sounds normal; no masses,  no organomegaly Extremities: extremities normal, atraumatic, no cyanosis or edema Skin: Skin color, texture, turgor normal. No rashes or lesions Lymph nodes: Cervical, supraclavicular, and axillary nodes normal. No abnormal inguinal nodes  palpated Neurologic: Grossly normal   Pelvic: External genitalia:  no lesions              Urethra:  normal appearing urethra with no masses, tenderness or lesions              Bartholins and Skenes: normal                 Vagina: normal appearing vagina with normal color and discharge, no lesions              Cervix: absent              Pap taken: no Bimanual Exam:  Uterus:  uterus absent              Adnexa: normal adnexa and no mass, fullness, tenderness               Rectovaginal: Confirms               Anus:  normal sphincter tone, no lesions  A:  Well Woman with normal exam PMP, no HRT Vaginal atrophy OAB H/O TAH/USO/and partial other ovary removal  P:   Mammogram in December.  D/W pt doing 3D MMG due to dense breasts DEXA with MMG.  Order placed. pap smear not indicated. tdap today return annually or prn  An After Visit Summary was printed and given to the patient.

## 2013-08-25 ENCOUNTER — Other Ambulatory Visit: Payer: Self-pay

## 2013-08-30 ENCOUNTER — Other Ambulatory Visit: Payer: Self-pay

## 2013-08-30 ENCOUNTER — Other Ambulatory Visit (INDEPENDENT_AMBULATORY_CARE_PROVIDER_SITE_OTHER): Payer: BC Managed Care – PPO

## 2013-08-30 DIAGNOSIS — Z1231 Encounter for screening mammogram for malignant neoplasm of breast: Secondary | ICD-10-CM

## 2013-08-30 DIAGNOSIS — E7800 Pure hypercholesterolemia, unspecified: Secondary | ICD-10-CM

## 2013-08-30 LAB — BASIC METABOLIC PANEL
BUN: 14 mg/dL (ref 6–23)
CO2: 29 mEq/L (ref 19–32)
Calcium: 9 mg/dL (ref 8.4–10.5)
Chloride: 104 mEq/L (ref 96–112)
Creatinine, Ser: 0.8 mg/dL (ref 0.4–1.2)
GFR: 80.95 mL/min (ref >60.00)
Glucose, Bld: 88 mg/dL (ref 70–99)
Potassium: 3.8 mEq/L (ref 3.5–5.1)
Sodium: 140 mEq/L (ref 135–145)

## 2013-08-30 LAB — LIPID PANEL
Cholesterol: 195 mg/dL (ref 0–200)
HDL: 71.1 mg/dL (ref >39.00)
LDL Cholesterol: 103 mg/dL — ABNORMAL HIGH (ref 0–99)
Total CHOL/HDL Ratio: 3
Triglycerides: 104 mg/dL (ref 0.0–149.0)
VLDL: 20.8 mg/dL (ref 0.0–40.0)

## 2013-08-30 NOTE — Progress Notes (Signed)
Quick Note:  Please make copy of labs for patient visit. ______ 

## 2013-09-06 ENCOUNTER — Other Ambulatory Visit: Payer: BC Managed Care – PPO

## 2013-09-06 ENCOUNTER — Encounter: Payer: Self-pay | Admitting: Cardiology

## 2013-09-06 ENCOUNTER — Ambulatory Visit (INDEPENDENT_AMBULATORY_CARE_PROVIDER_SITE_OTHER): Payer: BC Managed Care – PPO | Admitting: Cardiology

## 2013-09-06 VITALS — BP 122/60 | HR 71 | Ht 62.5 in | Wt 132.0 lb

## 2013-09-06 DIAGNOSIS — I951 Orthostatic hypotension: Secondary | ICD-10-CM

## 2013-09-06 DIAGNOSIS — M479 Spondylosis, unspecified: Secondary | ICD-10-CM

## 2013-09-06 DIAGNOSIS — E7800 Pure hypercholesterolemia, unspecified: Secondary | ICD-10-CM

## 2013-09-06 NOTE — Assessment & Plan Note (Signed)
The patient is not having any significant spine pain at the present time

## 2013-09-06 NOTE — Progress Notes (Signed)
Adolphus Birchwood Date of Birth:  03-02-1951 [address] [place], [place]  [address] [phone]  Fax   [phone]  HPI: This pleasant 62 year old woman is seen for a six-month followup office visit. She has a past history of orthostatic hypotension and a past history of syncope. She also has a history of familial hypercholesterolemia. His last visit she has been doing well.  She had successful lumbosacral spine surgery by Dr. Venetia Maxon in February 2014.  She has now had surgery on her thoracic lumbar and cervical spine.  She has gained 9 pounds since her last office visit which she attributes to lack of exercise postoperatively. She's had no further episodes of dizziness or syncope. She is trying to make sure she drinks plenty of fluids. She is no longer having any tachycardia since we put her on atenolol.  That her last visit she was complaining of leg cramps from the Crestor.  We switched her to Lipitor 10 mg daily and her leg cramps have resolved.   Current Outpatient Prescriptions  Medication Sig Dispense Refill  . ARTHROTEC 75 75-200 MG-MCG per tablet Take 1 tablet by mouth Twice daily.      Marland Kitchen atenolol (TENORMIN) 25 MG tablet Take 1 tablet (25 mg total) by mouth 2 (two) times daily.  60 tablet  11  . atorvastatin (LIPITOR) 20 MG tablet Take 10 mg by mouth daily.      . Calcium Carbonate (CALTRATE 600 PO) Take 1 tablet by mouth daily.       Marland Kitchen conjugated estrogens (PREMARIN) vaginal cream 1/2 gram vaginally twice weekly and small amt to urethra daily.  30 g  2  . CYMBALTA 60 MG capsule Take 1 tablet by mouth Daily.      . Misc Natural Products (MENOPAUTONIC PO) Take by mouth. 1 TABLET IN THE AM AND 1 TABLET IN THE PM      . Multiple Vitamin (MULTIVITAMIN WITH MINERALS) TABS Take 1 tablet by mouth daily.       No current facility-administered medications for this visit.    Allergies  Allergen Reactions  . Codeine Nausea Only    HALLUCINATE  . Crestor [Rosuvastatin]      Muscle cramps  . Sulfur Swelling    Lips, hands, feet    Patient Active Problem List   Diagnosis Date Noted  . Osteoarthritis of spine 03/02/2013  . Orthostatic hypotension 03/02/2013  . Hypercholesterolemia 03/25/2011    History  Smoking status  . Former Smoker  . Quit date: 10/20/1977  Smokeless tobacco  . Never Used    History  Alcohol Use  . 1.2 oz/week  . 2 Glasses of wine per week    Comment: 2 GLASSES DAILY    Family History  Problem Relation Age of Onset  . Cancer Mother   . Heart attack Father   . Cancer Father     Review of Systems: The patient denies any heat or cold intolerance.  No weight gain or weight loss.  The patient denies headaches or blurry vision.  There is no cough or sputum production.  The patient denies dizziness.  There is no hematuria or hematochezia.  The patient denies any muscle aches or arthritis.  The patient denies any rash.  The patient denies frequent falling or instability.  There is no history of depression or anxiety.  All other systems were reviewed and are negative.   Physical Exam: Filed Vitals:   09/06/13 0852  BP: 122/60  Pulse: 71  the general appearance reveals a well-developed well-nourished woman in no distress.The head and neck exam reveals pupils equal and reactive.  Extraocular movements are full.  There is no scleral icterus.  The mouth and pharynx are normal.  The neck is supple.  The carotids reveal no bruits.  The jugular venous pressure is normal.  The  thyroid is not enlarged.  There is no lymphadenopathy.  The chest is clear to percussion and auscultation.  There are no rales or rhonchi.  Expansion of the chest is symmetrical.  The precordium is quiet.  The first heart sound is normal.  The second heart sound is physiologically split.  There is no murmur gallop rub or click.  There is no abnormal lift or heave.  The abdomen is soft and nontender.  The bowel sounds are normal.  The liver and spleen are not  enlarged.  There are no abdominal masses.  There are no abdominal bruits.  Extremities reveal good pedal pulses.  There is no phlebitis or edema.  There is no cyanosis or clubbing.  Strength is normal and symmetrical in all extremities.  There is no lateralizing weakness.  There are no sensory deficits.  The skin is warm and dry.  There is no rash.      Assessment / Plan: Continue current therapy.  Recheck in 6 months for followup office visit lipid panel hepatic function panel and basal metabolic panel.  Careful diet and lose weight.

## 2013-09-06 NOTE — Assessment & Plan Note (Signed)
No further episodes of orthostatic hypotension or syncope. Her blood pressure ran low in the hospital

## 2013-09-06 NOTE — Assessment & Plan Note (Signed)
Despite her 9 pound weight gain, her lipids have remained stable on current therapy.  She is not having any significant myalgias

## 2013-09-06 NOTE — Patient Instructions (Signed)
Your physician recommends that you continue on your current medications as directed. Please refer to the Current Medication list given to you today.  Your physician wants you to follow-up in: 6 months with fasting labs (lp/bmet/hfp)  You will receive a reminder letter in the mail two months in advance. If you don't receive a letter, please call our office to schedule the follow-up appointment.   Work harder on weight loss

## 2013-10-04 ENCOUNTER — Ambulatory Visit
Admission: RE | Admit: 2013-10-04 | Discharge: 2013-10-04 | Disposition: A | Discharge status: Home / Self Care | Payer: BC Managed Care – PPO | Source: Ambulatory Visit

## 2013-10-04 ENCOUNTER — Ambulatory Visit
Admission: RE | Admit: 2013-10-04 | Discharge: 2013-10-04 | Disposition: A | Discharge status: Home / Self Care | Payer: BC Managed Care – PPO | Source: Ambulatory Visit | Attending: Obstetrics & Gynecology | Admitting: Obstetrics & Gynecology

## 2013-10-04 DIAGNOSIS — M8580 Other specified disorders of bone density and structure, unspecified site: Secondary | ICD-10-CM

## 2013-10-04 DIAGNOSIS — Z1231 Encounter for screening mammogram for malignant neoplasm of breast: Secondary | ICD-10-CM

## 2013-11-07 ENCOUNTER — Institutional Professional Consult (permissible substitution): Payer: BC Managed Care – PPO | Admitting: Obstetrics & Gynecology

## 2013-12-06 ENCOUNTER — Institutional Professional Consult (permissible substitution): Payer: BC Managed Care – PPO | Admitting: Obstetrics & Gynecology

## 2013-12-26 ENCOUNTER — Institutional Professional Consult (permissible substitution): Payer: BC Managed Care – PPO | Admitting: Obstetrics & Gynecology

## 2013-12-27 ENCOUNTER — Institutional Professional Consult (permissible substitution): Payer: BC Managed Care – PPO | Admitting: Obstetrics & Gynecology

## 2014-01-04 ENCOUNTER — Other Ambulatory Visit: Payer: Self-pay | Admitting: Cardiology

## 2014-01-18 ENCOUNTER — Other Ambulatory Visit: Payer: Self-pay | Admitting: Dermatology

## 2014-01-27 ENCOUNTER — Telehealth: Payer: Self-pay | Admitting: *Deleted

## 2014-01-27 ENCOUNTER — Ambulatory Visit (INDEPENDENT_AMBULATORY_CARE_PROVIDER_SITE_OTHER): Payer: BC Managed Care – PPO | Admitting: Obstetrics & Gynecology

## 2014-01-27 VITALS — BP 100/58 | HR 68 | Resp 16 | Ht 62.25 in | Wt 125.0 lb

## 2014-01-27 DIAGNOSIS — M810 Age-related osteoporosis without current pathological fracture: Secondary | ICD-10-CM

## 2014-01-27 NOTE — Telephone Encounter (Signed)
Call to patient. ROI reviewed and ok to leave message on home and cell number. LM that "we have a book here for her".  Her appointment calender was left in Dr Ammie Ferrier office. LMTCB.

## 2014-01-28 LAB — COMPREHENSIVE METABOLIC PANEL
ALT: 33 U/L (ref 0–35)
AST: 29 U/L (ref 0–37)
Albumin: 4.5 g/dL (ref 3.5–5.2)
Alkaline Phosphatase: 53 U/L (ref 39–117)
BUN: 22 mg/dL (ref 6–23)
CO2: 28 mEq/L (ref 19–32)
Calcium: 9.8 mg/dL (ref 8.4–10.5)
Chloride: 103 mEq/L (ref 96–112)
Creat: 0.83 mg/dL (ref 0.50–1.10)
Glucose, Bld: 87 mg/dL (ref 70–99)
Potassium: 4.2 mEq/L (ref 3.5–5.3)
Sodium: 141 mEq/L (ref 135–145)
Total Bilirubin: 0.5 mg/dL (ref 0.2–1.2)
Total Protein: 6.3 g/dL (ref 6.0–8.3)

## 2014-01-28 LAB — TSH: TSH: 0.361 u[IU]/mL (ref 0.350–4.500)

## 2014-01-28 LAB — VITAMIN D 25 HYDROXY (VIT D DEFICIENCY, FRACTURES): Vit D, 25-Hydroxy: 46 ng/mL (ref 30–89)

## 2014-01-30 LAB — PARATHYROID HORMONE, INTACT (NO CA): PTH: 51 pg/mL (ref 14.0–72.0)

## 2014-02-08 ENCOUNTER — Encounter: Payer: Self-pay | Admitting: Obstetrics & Gynecology

## 2014-02-08 ENCOUNTER — Telehealth: Payer: Self-pay

## 2014-02-08 DIAGNOSIS — Z01812 Encounter for preprocedural laboratory examination: Secondary | ICD-10-CM
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
## 2014-02-08 NOTE — Telephone Encounter (Signed)
Lmtcb//kn 

## 2014-02-21 NOTE — Telephone Encounter (Signed)
Spoke with representative [facility] at [phone]  CPT Code: H9144 Reclast injection Dx 733.01 Senile Osteoporosis  Per rep, no prior authorization needed for reclast if received on an outpatient basis.  Then was transferred to Dianah Field at [facility] of [facility] office who states that patient has out patient benefits.  $933.00 Deductible, $0 Met so far.  Once Deductible Met, 30% Co-insurance, Paid at 30% until Max of $3,793, $0 met.   Spoke with Elberta Fortis at [facility] at [phone] and he states that facility charges for infusion are estimated at $2026.00. Patient would be responsible for deductible of $933.00 and then at 30% and this does not include cost of medication.

## 2014-02-21 NOTE — Telephone Encounter (Signed)
Message copied by Michele Mcalpine on Tue Feb 21, 2014  4:44 PM ------      Message from: Megan Salon      Created: Mon Feb 20, 2014 11:51 AM       Forwarding message to [place] to start process for pt to initiate Reclast in this pt with osteoporosis.            CC: Doreene Burke ------

## 2014-02-22 ENCOUNTER — Telehealth: Payer: Self-pay | Admitting: *Deleted

## 2014-02-22 NOTE — Telephone Encounter (Signed)
Spoke with patient. Message regarding benefits given. She is agreeable to scheduling reclast. Advised that minimum facility charge does not include cost of reclast. She verbalizes understanding. Declines precert for Prolia at this time.  Advised will need lab work and then have infusion done within 30 days of lab work obtained.  Patient scheduled for CMP for 02/24/14.   Will wait for results and then schedule.   Order to Dr. Sabra Heck for signature.

## 2014-02-24 ENCOUNTER — Other Ambulatory Visit (INDEPENDENT_AMBULATORY_CARE_PROVIDER_SITE_OTHER): Payer: BC Managed Care – PPO

## 2014-02-24 DIAGNOSIS — Z01812 Encounter for preprocedural laboratory examination: Secondary | ICD-10-CM

## 2014-02-25 LAB — COMPREHENSIVE METABOLIC PANEL
ALT: 33 U/L (ref 0–35)
AST: 27 U/L (ref 0–37)
Albumin: 4.6 g/dL (ref 3.5–5.2)
Alkaline Phosphatase: 56 U/L (ref 39–117)
BUN: 16 mg/dL (ref 6–23)
CO2: 29 mEq/L (ref 19–32)
Calcium: 9.5 mg/dL (ref 8.4–10.5)
Chloride: 103 mEq/L (ref 96–112)
Creat: 0.74 mg/dL (ref 0.50–1.10)
Glucose, Bld: 88 mg/dL (ref 70–99)
Potassium: 4.3 mEq/L (ref 3.5–5.3)
Sodium: 139 mEq/L (ref 135–145)
Total Bilirubin: 0.6 mg/dL (ref 0.2–1.2)
Total Protein: 6.5 g/dL (ref 6.0–8.3)

## 2014-02-27 NOTE — Telephone Encounter (Signed)
Error

## 2014-02-28 NOTE — Telephone Encounter (Signed)
Spoke with patient and instructions and appointment given. She is agreeable to time/date/location.  Patient asked about side effects from reclast and discussed potential side effects from Relcast: Side Effects. Nausea, tiredness, flu-like symptoms (e.g., fever, chills, muscle/joint aches), dizziness, headache, or pain/redness/swelling at the injection site may occur. Most of these side effects are mild and occur within 3 days of treatment. Patient concerned about nausea r/t to treatment. Advised patient to eat and drink as instructed prior to infusion and if has nausea can call our office and can discuss with Dr. Sabra Heck.   Patient is agreeable to instructions and verbalized understanding. Will follow up prn.  Routing to provider for final review. Patient agreeable to disposition. Will close encounter

## 2014-02-28 NOTE — Telephone Encounter (Signed)
Patient returning Tracy's call. °

## 2014-02-28 NOTE — Telephone Encounter (Signed)
Patient has appointment at [facility] Stay for 03/22/14 at 1200. I am not sure if they contacted her to schedule.  Message left to return call to [facility] at [phone] to ensure patient aware of time/date/appointment.   Patient will need relcast instructions as below: Outpatient infusion of Reclast for 03/22/14 at 1200 at the [facility]. Their address is [address] and phone number is [phone]. You can use the valet parking for your convenience.  INSTRUCTIONS PRE-INFUSION for Reclast:  1. You may eat normally before receiving your infusion.   2. Drink two (2) glasses of fluids, such as water, within a few hours before receiving Reclast to help prevent kidney problems.   3. On the day of your infusion, DO NOT take any multivitamins.   4. On the day of your infusion, DO NOT take any other medications that are used to treat osteoporosis (like Fosamax, Boniva, Evista, Actonel).   5. Limit intake of NSAIDS (like Motrin) before and after the infusion.   6. You need to take an oral calcium supplement of 1200-1500 mg daily AND a Vitamin D supplement of at least 400IU daily.

## 2014-03-06 DIAGNOSIS — M4310 Spondylolisthesis, site unspecified: Secondary | ICD-10-CM

## 2014-03-06 HISTORY — DX: Spondylolisthesis, site unspecified: M43.10

## 2014-03-15 ENCOUNTER — Other Ambulatory Visit: Payer: Self-pay | Admitting: Cardiology

## 2014-03-22 ENCOUNTER — Other Ambulatory Visit (HOSPITAL_COMMUNITY): Payer: Self-pay | Admitting: Obstetrics & Gynecology

## 2014-03-22 ENCOUNTER — Encounter (HOSPITAL_COMMUNITY): Payer: Self-pay

## 2014-03-22 ENCOUNTER — Ambulatory Visit (HOSPITAL_COMMUNITY)
Admission: RE | Admit: 2014-03-22 | Discharge: 2014-03-22 | Disposition: A | Discharge status: Home / Self Care | Payer: BC Managed Care – PPO | Source: Ambulatory Visit | Attending: Obstetrics & Gynecology | Admitting: Obstetrics & Gynecology

## 2014-03-22 DIAGNOSIS — M810 Age-related osteoporosis without current pathological fracture: Secondary | ICD-10-CM | POA: Insufficient documentation

## 2014-03-22 MED ORDER — ZOLEDRONIC ACID 5 MG/100ML IV SOLN
5.0000 mg | Freq: Once | INTRAVENOUS | Status: AC
  Administered 2014-03-22: 5 mg via INTRAVENOUS
  Filled 2014-03-22: qty 100

## 2014-03-22 MED ORDER — SODIUM CHLORIDE 0.9 % IV SOLN
Freq: Once | INTRAVENOUS | Status: AC
  Administered 2014-03-22: 12:00:00 via INTRAVENOUS

## 2014-03-22 NOTE — Discharge Instructions (Signed)
Drink fluids/water as tolerated over next 72hrs °Tylenol or Ibuprofen OTC as directed °Continue calcium and Vit D as directed by your MDZoledronic Acid injection (Paget's Disease, Osteoporosis) °What is this medicine? °ZOLEDRONIC ACID (ZOE le dron ik AS id) lowers the amount of calcium loss from bone. It is used to treat Paget's disease and osteoporosis in women. °This medicine may be used for other purposes; ask your health care provider or pharmacist if you have questions. °COMMON BRAND NAME(S): Reclast, Zometa °What should I tell my health care provider before I take this medicine? °They need to know if you have any of these conditions: °-aspirin-sensitive asthma °-cancer, especially if you are receiving medicines used to treat cancer °-dental disease or wear dentures °-infection °-kidney disease °-low levels of calcium in the blood °-past surgery on the parathyroid gland or intestines °-receiving corticosteroids like dexamethasone or prednisone °-an unusual or allergic reaction to zoledronic acid, other medicines, foods, dyes, or preservatives °-pregnant or trying to get pregnant °-breast-feeding °How should I use this medicine? °This medicine is for infusion into a vein. It is given by a health care professional in a hospital or clinic setting. °Talk to your pediatrician regarding the use of this medicine in children. This medicine is not approved for use in children. °Overdosage: If you think you have taken too much of this medicine contact a poison control center or emergency room at once. °NOTE: This medicine is only for you. Do not share this medicine with others. °What if I miss a dose? °It is important not to miss your dose. Call your doctor or health care professional if you are unable to keep an appointment. °What may interact with this medicine? °-certain antibiotics given by injection °-NSAIDs, medicines for pain and inflammation, like ibuprofen or naproxen °-some diuretics like bumetanide,  furosemide °-teriparatide °This list may not describe all possible interactions. Give your health care provider a list of all the medicines, herbs, non-prescription drugs, or dietary supplements you use. Also tell them if you smoke, drink alcohol, or use illegal drugs. Some items may interact with your medicine. °What should I watch for while using this medicine? °Visit your doctor or health care professional for regular checkups. It may be some time before you see the benefit from this medicine. Do not stop taking your medicine unless your doctor tells you to. Your doctor may order blood tests or other tests to see how you are doing. °Women should inform their doctor if they wish to become pregnant or think they might be pregnant. There is a potential for serious side effects to an unborn child. Talk to your health care professional or pharmacist for more information. °You should make sure that you get enough calcium and vitamin D while you are taking this medicine. Discuss the foods you eat and the vitamins you take with your health care professional. °Some people who take this medicine have severe bone, joint, and/or muscle pain. This medicine may also increase your risk for jaw problems or a broken thigh bone. Tell your doctor right away if you have severe pain in your jaw, bones, joints, or muscles. Tell your doctor if you have any pain that does not go away or that gets worse. °Tell your dentist and dental surgeon that you are taking this medicine. You should not have major dental surgery while on this medicine. See your dentist to have a dental exam and fix any dental problems before starting this medicine. Take good care of your teeth while on   this medicine. Make sure you see your dentist for regular follow-up appointments. °What side effects may I notice from receiving this medicine? °Side effects that you should report to your doctor or health care professional as soon as possible: °-allergic reactions  like skin rash, itching or hives, swelling of the face, lips, or tongue °-anxiety, confusion, or depression °-breathing problems °-changes in vision °-eye pain °-feeling faint or lightheaded, falls °-jaw pain, especially after dental work °-mouth sores °-muscle cramps, stiffness, or weakness °-trouble passing urine or change in the amount of urine °Side effects that usually do not require medical attention (report to your doctor or health care professional if they continue or are bothersome): °-bone, joint, or muscle pain °-constipation °-diarrhea °-fever °-hair loss °-irritation at site where injected °-loss of appetite °-nausea, vomiting °-stomach upset °-trouble sleeping °-trouble swallowing °-weak or tired °This list may not describe all possible side effects. Call your doctor for medical advice about side effects. You may report side effects to FDA at 1-800-FDA-1088. °Where should I keep my medicine? °This drug is given in a hospital or clinic and will not be stored at home. °NOTE: This sheet is a summary. It may not cover all possible information. If you have questions about this medicine, talk to your doctor, pharmacist, or health care provider. °© 2014, Elsevier/Gold Standard. (2013-03-21 10:03:48) ° °

## 2014-03-27 ENCOUNTER — Other Ambulatory Visit: Payer: BC Managed Care – PPO

## 2014-03-29 ENCOUNTER — Ambulatory Visit: Payer: BC Managed Care – PPO | Admitting: Cardiology

## 2014-04-03 ENCOUNTER — Other Ambulatory Visit (INDEPENDENT_AMBULATORY_CARE_PROVIDER_SITE_OTHER): Payer: BC Managed Care – PPO

## 2014-04-03 DIAGNOSIS — E7800 Pure hypercholesterolemia, unspecified: Secondary | ICD-10-CM

## 2014-04-03 LAB — BASIC METABOLIC PANEL
BUN: 16 mg/dL (ref 6–23)
CO2: 28 mEq/L (ref 19–32)
Calcium: 9.1 mg/dL (ref 8.4–10.5)
Chloride: 104 mEq/L (ref 96–112)
Creatinine, Ser: 0.8 mg/dL (ref 0.4–1.2)
GFR: 80.8 mL/min (ref >60.00)
Glucose, Bld: 100 mg/dL — ABNORMAL HIGH (ref 70–99)
Potassium: 4.2 mEq/L (ref 3.5–5.1)
Sodium: 139 mEq/L (ref 135–145)

## 2014-04-03 LAB — HEPATIC FUNCTION PANEL
ALT: 25 U/L (ref 0–35)
AST: 25 U/L (ref 0–37)
Albumin: 4.4 g/dL (ref 3.5–5.2)
Alkaline Phosphatase: 55 U/L (ref 39–117)
Bilirubin, Direct: 0.1 mg/dL (ref 0.0–0.3)
Total Bilirubin: 0.4 mg/dL (ref 0.2–1.2)
Total Protein: 6.7 g/dL (ref 6.0–8.3)

## 2014-04-03 LAB — LIPID PANEL
Cholesterol: 225 mg/dL — ABNORMAL HIGH (ref 0–200)
HDL: 67 mg/dL (ref >39.00)
LDL Cholesterol: 140 mg/dL — ABNORMAL HIGH (ref 0–99)
NonHDL: 158
Total CHOL/HDL Ratio: 3
Triglycerides: 92 mg/dL (ref 0.0–149.0)
VLDL: 18.4 mg/dL (ref 0.0–40.0)

## 2014-04-03 NOTE — Progress Notes (Signed)
Quick Note:  Please make copy of labs for patient visit. ______ 

## 2014-04-05 ENCOUNTER — Ambulatory Visit: Payer: BC Managed Care – PPO | Admitting: Nurse Practitioner

## 2014-04-10 ENCOUNTER — Ambulatory Visit (INDEPENDENT_AMBULATORY_CARE_PROVIDER_SITE_OTHER): Payer: BC Managed Care – PPO | Admitting: Nurse Practitioner

## 2014-04-10 ENCOUNTER — Encounter: Payer: Self-pay | Admitting: Nurse Practitioner

## 2014-04-10 VITALS — BP 100/80 | HR 67 | Ht 63.0 in | Wt 123.0 lb

## 2014-04-10 DIAGNOSIS — R002 Palpitations: Secondary | ICD-10-CM | POA: Insufficient documentation

## 2014-04-10 DIAGNOSIS — E7800 Pure hypercholesterolemia, unspecified: Secondary | ICD-10-CM

## 2014-04-10 NOTE — Patient Instructions (Signed)
Let's stop the Lipitor for one month  Call Melinda at the end of that month with an update  Stay on all your other medicines  Tentatively see Dr. Mare Ferrari  Stay active  Call the [facility] office at [phone] if you have any questions, problems or concerns.

## 2014-04-10 NOTE — Progress Notes (Signed)
Michelle Martinez Date of Birth: 10-16-1951 Medical Record #[id number]  History of Present Illness: Michelle Martinez is seen back today for a 6 month check. Seen for Dr. Mare Ferrari. She has a history of orthostatic hypotension and syncope. Other issues include palpitations, HLD and lumbar/sacral spine issues.   Last seen here in November. Was doing ok.  Comes back today. Here alone. Doing ok. No chest pain. Not short of breath. Lipids not as good this time. She is taking her statin. Complains of cramps every night in her legs - wonders if it is statin related. Walking daily. Lots of stress with her father/step mother. No longer dizzy or lightheaded. Overall, feels like she is doing well.  Current Outpatient Prescriptions  Medication Sig Dispense Refill  . ARTHROTEC 75 75-200 MG-MCG per tablet Take 1 tablet by mouth Twice daily.      Marland Kitchen atenolol (TENORMIN) 25 MG tablet take 1 tablet by mouth twice a day  60 tablet  1  . atorvastatin (LIPITOR) 20 MG tablet Take 10 mg by mouth daily.      Marland Kitchen atorvastatin (LIPITOR) 20 MG tablet take 1 tablet by mouth once daily  30 tablet  6  . calcium-vitamin D (OSCAL WITH D) 500-200 MG-UNIT per tablet Take 3 tablets by mouth 3 (three) times daily.      Marland Kitchen conjugated estrogens (PREMARIN) vaginal cream 1/2 gram vaginally twice weekly and small amt to urethra daily.  30 g  2  . CYMBALTA 60 MG capsule Take 1 tablet by mouth Daily.      Marland Kitchen FLUOROPLEX 1 % cream       . Multiple Vitamin (MULTIVITAMIN WITH MINERALS) TABS Take 1 tablet by mouth daily.      . zoledronic acid (RECLAST) 5 MG/100ML SOLN injection Inject 5 mg into the vein once.       No current facility-administered medications for this visit.    Allergies  Allergen Reactions  . Codeine Nausea Only    HALLUCINATE  . Crestor [Rosuvastatin]     Muscle cramps   . Sulfur Swelling    Lips, hands, feet    Past Medical History  Diagnosis Date  . Arthritis   . Hyperlipidemia   . Palpitation   . Fatigue    . Complication of anesthesia     HYPOTENSION , DIFFICULTY WAKING UP   . PONV (postoperative nausea and vomiting)   . Syncope     SEES DR BRACKBILL   . Hypotension   . Dysrhythmia     OCC RAPID HEART BEAT  . Headache(784.0)   . Frequency of urination   . Melanoma     Past Surgical History  Procedure Laterality Date  . Cesarean section    . Cesarean section    . Total abdominal hysterectomy w/ bilateral salpingoophorectomy      Part of one ovary remians  . Neck surgery    . Back surgery      mid back  . Lumbar fusion  12/15/2012    Dr Vertell Limber    History  Smoking status  . Former Smoker  . Quit date: 10/20/1977  Smokeless tobacco  . Never Used    History  Alcohol Use  . 1.2 oz/week  . 2 Glasses of wine per week    Comment: 2 GLASSES DAILY    Family History  Problem Relation Age of Onset  . Cancer Mother   . Heart attack Father   . Cancer Father     Review  of Systems: The review of systems is per the HPI.  All other systems were reviewed and are negative.  Physical Exam: BP 100/80  Pulse 67  Ht 5\' 3"  (1.6 m)  Wt 123 lb (55.792 kg)  BMI 21.79 kg/m2  SpO2 100% Patient is very pleasant and in no acute distress. Skin is warm and dry. Color is normal.  HEENT is unremarkable. Normocephalic/atraumatic. PERRL. Sclera are nonicteric. Neck is supple. No masses. No JVD. Lungs are clear. Cardiac exam shows a regular rate and rhythm. Abdomen is soft. Extremities are without edema. Gait and ROM are intact. No gross neurologic deficits noted.  Wt Readings from Last 3 Encounters:  04/10/14 123 lb (55.792 kg)  03/22/14 123 lb 7.3 oz (56 kg)  01/27/14 125 lb (56.7 kg)    LABORATORY DATA: Lab Results  Component Value Date   WBC 5.7 12/08/2012   HGB 14.5 12/08/2012   HCT 42.4 12/08/2012   PLT 211 12/08/2012   GLUCOSE 100* 04/03/2014   CHOL 225* 04/03/2014   TRIG 92.0 04/03/2014   HDL 67.00 04/03/2014   LDLDIRECT 146.8 02/28/2013   LDLCALC 140* 04/03/2014   ALT 25 04/03/2014    AST 25 04/03/2014   NA 139 04/03/2014   K 4.2 04/03/2014   CL 104 04/03/2014   CREATININE 0.8 04/03/2014   BUN 16 04/03/2014   CO2 28 04/03/2014   TSH 0.361 01/27/2014     Assessment / Plan: 1. HLD - labs reviewed. Not as good. But also with nightly cramps - will give her a one month holiday. Call back with update. If she is improved, I would start Pravachol 20 mg. If no change in her symptoms, would try to restart the Lipitor.   2. Orthostatic hypotension with past syncope  3. Palpitations - on low dose beta blocker.  4. Situational stress  Patient is agreeable to this plan and will call if any problems develop in the interim.   Burtis Junes, RN, Michelle [address] [place], [place]  [address] [phone]

## 2014-04-24 NOTE — Telephone Encounter (Signed)
Routing to provider for final review. Patient agreeable to disposition. Will close encounter.     

## 2014-05-01 ENCOUNTER — Telehealth: Payer: Self-pay | Admitting: Obstetrics & Gynecology

## 2014-05-01 NOTE — Telephone Encounter (Signed)
Patient wants to know if its okay for her to stop CYMBALTA 60 MG capsule is given by another doctor but she is in process of moving and other doctor will not refill with out a visit. She states she has not seem him in a long time and dr Sabra Heck is who she sees most and most recent. She doesn't feel like she needs the medication anymore and wants to know if its okay if she just stops taking it.

## 2014-05-02 MED ORDER — DULOXETINE HCL 60 MG PO CPEP: 60.0000 mg | ORAL_CAPSULE | Freq: Every day | ORAL | Status: DC

## 2014-05-02 NOTE — Telephone Encounter (Signed)
Dr. Sabra Heck, can you review and advise? Appears patient has been on Cymbalta since 2012. Does she need taper if decides to dc?

## 2014-05-02 NOTE — Telephone Encounter (Signed)
I would not recommend her stopping right now in the middle of a move. I can refill for her. Once she is done moving, she can decrease to 30mg  for a month and then taper off.  I can give her instructions but she may have some withdrawal symptoms and I don't think this is the right time to do it.  Please let her know.  I can place orders.

## 2014-05-02 NOTE — Telephone Encounter (Signed)
Patient is calling back about the medication.

## 2014-05-02 NOTE — Telephone Encounter (Signed)
Rx done for 1 year to [facility] in [place].  Encounter closed.

## 2014-05-02 NOTE — Telephone Encounter (Signed)
Spoke with patient. She is very grateful for Dr. Sanjuan Dame advice. She would like to continue, then will taper. Advised would send message to Dr. Sabra Heck to place orders.

## 2014-05-09 ENCOUNTER — Other Ambulatory Visit: Payer: Self-pay | Admitting: Cardiology

## 2014-05-15 ENCOUNTER — Other Ambulatory Visit: Payer: Self-pay

## 2014-05-15 MED ORDER — PRAVASTATIN SODIUM 20 MG PO TABS: 20.0000 mg | ORAL_TABLET | Freq: Every evening | ORAL | Status: DC

## 2014-08-21 ENCOUNTER — Encounter: Payer: Self-pay | Admitting: Nurse Practitioner

## 2014-08-21 ENCOUNTER — Ambulatory Visit: Payer: BC Managed Care – PPO | Admitting: Obstetrics & Gynecology

## 2014-08-22 ENCOUNTER — Other Ambulatory Visit: Payer: Self-pay | Admitting: Dermatology

## 2014-09-06 ENCOUNTER — Other Ambulatory Visit: Payer: Self-pay

## 2014-09-06 DIAGNOSIS — Z1231 Encounter for screening mammogram for malignant neoplasm of breast: Secondary | ICD-10-CM

## 2014-09-26 ENCOUNTER — Ambulatory Visit (INDEPENDENT_AMBULATORY_CARE_PROVIDER_SITE_OTHER): Payer: BC Managed Care – PPO | Admitting: Obstetrics & Gynecology

## 2014-09-26 ENCOUNTER — Encounter: Payer: Self-pay | Admitting: Obstetrics & Gynecology

## 2014-09-26 VITALS — BP 100/62 | HR 64 | Resp 16 | Ht 62.5 in | Wt 124.4 lb

## 2014-09-26 DIAGNOSIS — Z01419 Encounter for gynecological examination (general) (routine) without abnormal findings: Secondary | ICD-10-CM

## 2014-09-26 MED ORDER — TEMAZEPAM 15 MG PO CAPS: 15.0000 mg | ORAL_CAPSULE | Freq: Every evening | ORAL | Status: DC | PRN

## 2014-09-26 MED ORDER — ESTROGENS, CONJUGATED 0.625 MG/GM VA CREA: TOPICAL_CREAM | VAGINAL | Status: DC

## 2014-09-26 NOTE — Progress Notes (Signed)
63 y.o. [id number] MarriedCaucasianF here for annual exam.  Husband diagnosed with ALS this year.  He is progressing fairly rapidly.  Sons are part owners of [employer].  One of them will get married Dec 23rd.    On Reclast.  Last year was first dose.  Did really well with this.  Due in late spring.  PCP:  Dr. Inda Merlin.  On Cymbalta due to depression.  Trouble sleeping.  Tried Trazodone.    No LMP recorded. Patient has had a hysterectomy.          Sexually active: No.  The current method of family planning is status post hysterectomy.    Exercising: Yes.    walking Smoker:  Former smoker  Health Maintenance: Pap:  Over 4 years ago  History of abnormal Pap:  no MMG:  10/04/13 3D-normal next one scheduled 10/06/14 Colonoscopy:  2010-repeat in 5 years.  Pt states she will do this after the beginning of the year.   BMD:   10/04/13-osteoporosis-had reclast infusion late spring TDaP:  07/29/13 Screening Labs: none, Hb today: none, Urine today: none   reports that she quit smoking about 36 years ago. She has never used smokeless tobacco. She reports that she drinks about 1.2 oz of alcohol per week. She reports that she does not use illicit drugs.  Past Medical History  Diagnosis Date  . Arthritis   . Hyperlipidemia   . Palpitation   . Fatigue   . Complication of anesthesia     HYPOTENSION , DIFFICULTY WAKING UP   . PONV (postoperative nausea and vomiting)   . Syncope     SEES DR BRACKBILL   . Hypotension   . Dysrhythmia     OCC RAPID HEART BEAT  . Headache(784.0)   . Frequency of urination   . Melanoma   . Osteoporosis     Past Surgical History  Procedure Laterality Date  . Cesarean section    . Cesarean section    . Total abdominal hysterectomy w/ bilateral salpingoophorectomy      Part of one ovary remians  . Neck surgery    . Back surgery      mid back  . Lumbar fusion  12/15/2012    Dr Vertell Limber    Current Outpatient Prescriptions  Medication Sig Dispense Refill  .  ARTHROTEC 75 75-200 MG-MCG per tablet Take 1 tablet by mouth Twice daily.    Marland Kitchen atenolol (TENORMIN) 25 MG tablet take 1 tablet by mouth twice a day 60 tablet 5  . calcium-vitamin D (OSCAL WITH D) 500-200 MG-UNIT per tablet Take 3 tablets by mouth 3 (three) times daily.    Marland Kitchen conjugated estrogens (PREMARIN) vaginal cream 1/2 gram vaginally twice weekly and small amt to urethra daily. 30 g 2  . DULoxetine (CYMBALTA) 60 MG capsule Take 1 capsule (60 mg total) by mouth daily. 30 capsule 12  . FLUOROPLEX 1 % cream     . FLUVIRIN 0.5 ML SUSY   0  . metroNIDAZOLE (METROGEL) 0.75 % gel   0  . Multiple Vitamin (MULTIVITAMIN WITH MINERALS) TABS Take 1 tablet by mouth daily.    . pravastatin (PRAVACHOL) 20 MG tablet Take 1 tablet (20 mg total) by mouth every evening. 90 tablet 3  . zoledronic acid (RECLAST) 5 MG/100ML SOLN injection Inject 5 mg into the vein once.    . traZODone (DESYREL) 50 MG tablet   0   No current facility-administered medications for this visit.    Family History  Problem Relation Age of Onset  . Cancer Mother   . Heart attack Father   . Cancer Father     ROS:  Pertinent items are noted in HPI.  Otherwise, a comprehensive ROS was negative.  Exam:   BP 100/62 mmHg  Pulse 64  Resp 16  Ht 5' 2.5" (1.588 m)  Wt 124 lb 6.4 oz (56.427 kg)  BMI 22.38 kg/m2  Weight change: -4#   Height: 5' 2.5" (158.8 cm)  Ht Readings from Last 3 Encounters:  09/26/14 5' 2.5" (1.588 m)  04/10/14 5\' 3"  (1.6 m)  03/22/14 5\' 2"  (1.575 m)    General appearance: alert, cooperative and appears stated age Head: Normocephalic, without obvious abnormality, atraumatic Neck: no adenopathy, supple, symmetrical, trachea midline and thyroid normal to inspection and palpation Lungs: clear to auscultation bilaterally Breasts: normal appearance, no masses or tenderness Heart: regular rate and rhythm Abdomen: soft, non-tender; bowel sounds normal; no masses,  no organomegaly Extremities: extremities  normal, atraumatic, no cyanosis or edema Skin: Skin color, texture, turgor normal. No rashes or lesions Lymph nodes: Cervical, supraclavicular, and axillary nodes normal. No abnormal inguinal nodes palpated Neurologic: Grossly normal   Pelvic: External genitalia:  no lesions              Urethra:  normal appearing urethra with no masses, tenderness or lesions              Bartholins and Skenes: normal                 Vagina: normal appearing vagina with normal color and discharge, no lesions              Cervix: absent              Pap taken: No. Bimanual Exam:  Uterus:  uterus absent              Adnexa: no mass, fullness, tenderness               Rectovaginal: Confirms               Anus:  normal sphincter tone, no lesions  A:  Well Woman with normal exam PMP, no HRT Vaginal atrophy OAB H/O TAH/USO/and partial other ovary removal Grieving husband's diagnosis. Insomnia  P: Mammogram in December. D/W pt doing 3D MMG due to dense breasts Reclast yearly.  Due in the spring.  Will need lab work before.  Premarin vaginal cream to urethra twice weekly Trial of Restoril 15mg  nightly, prn.  #30/5RF pap smear not indicated. return annually or prn  An After Visit Summary was printed and given to the patient.

## 2014-10-06 ENCOUNTER — Ambulatory Visit
Admission: RE | Admit: 2014-10-06 | Discharge: 2014-10-06 | Disposition: A | Discharge status: Home / Self Care | Payer: BC Managed Care – PPO | Source: Ambulatory Visit

## 2014-10-06 DIAGNOSIS — Z1231 Encounter for screening mammogram for malignant neoplasm of breast: Secondary | ICD-10-CM

## 2015-05-03 ENCOUNTER — Other Ambulatory Visit: Payer: Self-pay | Admitting: Cardiology

## 2015-05-04 ENCOUNTER — Other Ambulatory Visit: Payer: Self-pay

## 2015-05-04 NOTE — Telephone Encounter (Signed)
[facility] in [place] [facility] [place] was contacted and advise to send the pt's Duloxetine 60 medication to the [facility] in [place] location.

## 2015-05-04 NOTE — Telephone Encounter (Signed)
LMTCB regarding her Duloxetine medication.

## 2015-05-09 ENCOUNTER — Other Ambulatory Visit: Payer: Self-pay | Admitting: Obstetrics & Gynecology

## 2015-05-09 NOTE — Telephone Encounter (Signed)
Medication refill request: Cymbalta Last AEX:  09/26/14 SM Next AEX: Not scheduled  Last MMG (if hormonal medication request): 10/09/14 [id number]:neg Refill authorized: 05/02/14 #30/12R Today #30/5R?

## 2015-05-09 NOTE — Telephone Encounter (Signed)
Patient calling for a refill on Cymbalta. Pharmacy on file is correct.

## 2015-05-10 MED ORDER — DULOXETINE HCL 60 MG PO CPEP: 60.0000 mg | ORAL_CAPSULE | Freq: Every day | ORAL | Status: DC

## 2015-05-10 NOTE — Telephone Encounter (Signed)
Patient calling to check on the status of her refill request.

## 2015-05-10 NOTE — Telephone Encounter (Signed)
LM on patient's vm that rx has been sent to pharmacy.

## 2015-05-10 NOTE — Telephone Encounter (Signed)
Dr. Quincy Simmonds can you please advise refill please.

## 2015-05-10 NOTE — Telephone Encounter (Signed)
Patient is requesting a refill for Cymbalta. She is using [facility] at [employer]

## 2015-05-10 NOTE — Telephone Encounter (Signed)
Patient requests a voicemail to confirm once complete.

## 2015-05-18 ENCOUNTER — Telehealth: Payer: Self-pay | Admitting: Obstetrics & Gynecology

## 2015-05-18 DIAGNOSIS — Z01812 Encounter for preprocedural laboratory examination: Secondary | ICD-10-CM

## 2015-05-18 NOTE — Telephone Encounter (Signed)
Patient is asking to talk with Dr.Miller's nurse regarding her next infusion.

## 2015-05-18 NOTE — Telephone Encounter (Signed)
CPT Code: W8599 Reclast injection Dx 733.01 Senile Osteoporosis Called [facility] [place]. Spoke with Lattie Haw in [place] office.   Per rep, no prior authorization needed for reclast if received on an outpatient basis. Patient does have outpatient benefit coverage.   $1,054. Deductible, $0 Met so far. Once Deductible Met, 30% Co-insurance, Paid at 30% until Max of $4,282.00, $0 met.

## 2015-05-18 NOTE — Telephone Encounter (Signed)
Spoke with patient.  She is ready to schedule her next Reclast infusion.  Last infusion 03/22/14 at [facility].  Patient denies insurance changes or health changes. Patient states she did not have any issues with insurance coverage of procedures last year.  Last Dexa 10/04/2013.  Patient states she has an open schedule for August except for the 9th and the 17th. Scheduled for lab appointment for 05/21/15 for CMP. Patient advised she would be contacted with results and scheduling.

## 2015-05-21 ENCOUNTER — Other Ambulatory Visit (INDEPENDENT_AMBULATORY_CARE_PROVIDER_SITE_OTHER): Payer: BC Managed Care – PPO

## 2015-05-21 DIAGNOSIS — Z01812 Encounter for preprocedural laboratory examination: Secondary | ICD-10-CM

## 2015-05-21 LAB — COMPREHENSIVE METABOLIC PANEL
ALT: 17 U/L (ref 6–29)
AST: 20 U/L (ref 10–35)
Albumin: 4.3 g/dL (ref 3.6–5.1)
Alkaline Phosphatase: 32 U/L — ABNORMAL LOW (ref 33–130)
BUN: 13 mg/dL (ref 7–25)
CO2: 26 mmol/L (ref 20–31)
Calcium: 9.2 mg/dL (ref 8.6–10.4)
Chloride: 104 mmol/L (ref 98–110)
Creat: 0.75 mg/dL (ref 0.50–0.99)
Glucose, Bld: 85 mg/dL (ref 65–99)
Potassium: 4.5 mmol/L (ref 3.5–5.3)
Sodium: 143 mmol/L (ref 135–146)
Total Bilirubin: 0.4 mg/dL (ref 0.2–1.2)
Total Protein: 5.9 g/dL — ABNORMAL LOW (ref 6.1–8.1)

## 2015-05-22 NOTE — Telephone Encounter (Signed)
-----   Message from Megan Salon, MD sent at 05/22/2015  9:20 AM EDT ----- Inform cmp normal.  Ok for infusion.

## 2015-05-22 NOTE — Telephone Encounter (Signed)
Hold for day before, day of infusion and day after.  Thanks.

## 2015-05-22 NOTE — Telephone Encounter (Signed)
Call to patient. She is scheduled for Reclast for 06/05/15 at 1300 at [facility]. Advised to check in at admitting.  Patient aware of deductible. She is aware she will need to pay deductible prior to any coverage and is agreeable.  She is given Reclast instructions pre-infusion instructions and verbalized understanding :  You may eat normally before receiving your infusion. Drink two (2) glasses of fluids, such as water, within a few hours before receiving Reclast to help prevent kidney problems. On the day of your infusion, DO NOT take any multivitamins.  On the day of your infusion, DO NOT take any other medications that are used to treat osteoporosis (like Fosamax, Boniva, Evista, Actonel) Limit intake of NSAIDS (like Motrin) before and after the infusion.  You need to take an oral calcium supplement of 1200-1500 mg daily AND a Vitamin D supplement of at least 400IU daily.   Patient advised to call our office if develops any concerns after infusion.   Dr. Sabra Heck can you review and advise regarding patient use of Arthrotec and Reclast. Should she hold Arthotec before and or after infusion?

## 2015-05-22 NOTE — Telephone Encounter (Signed)
Order faxed to [facility] outpatient infusion at this time.

## 2015-05-28 NOTE — Telephone Encounter (Signed)
Called patient and message from Dr. Sabra Heck given. Patient agreeable to instructions. Will close encounter.

## 2015-05-30 ENCOUNTER — Other Ambulatory Visit (HOSPITAL_COMMUNITY): Payer: Self-pay | Admitting: *Deleted

## 2015-06-05 ENCOUNTER — Ambulatory Visit (HOSPITAL_COMMUNITY)
Admission: RE | Admit: 2015-06-05 | Discharge: 2015-06-05 | Disposition: A | Discharge status: Home / Self Care | Payer: BC Managed Care – PPO | Source: Ambulatory Visit | Attending: Obstetrics & Gynecology | Admitting: Obstetrics & Gynecology

## 2015-06-05 ENCOUNTER — Encounter (HOSPITAL_COMMUNITY): Payer: Self-pay

## 2015-06-05 DIAGNOSIS — M810 Age-related osteoporosis without current pathological fracture: Secondary | ICD-10-CM | POA: Insufficient documentation

## 2015-06-05 MED ORDER — SODIUM CHLORIDE 0.9 % IV SOLN
INTRAVENOUS | Status: AC
  Administered 2015-06-05: 13:00:00 via INTRAVENOUS

## 2015-06-05 MED ORDER — ZOLEDRONIC ACID 5 MG/100ML IV SOLN
5.0000 mg | Freq: Once | INTRAVENOUS | Status: AC
  Administered 2015-06-05: 5 mg via INTRAVENOUS
  Filled 2015-06-05: qty 100

## 2015-06-05 NOTE — Discharge Instructions (Signed)

## 2015-07-12 ENCOUNTER — Other Ambulatory Visit: Payer: Self-pay | Admitting: Cardiology

## 2015-09-06 ENCOUNTER — Other Ambulatory Visit: Payer: Self-pay

## 2015-09-06 DIAGNOSIS — Z1231 Encounter for screening mammogram for malignant neoplasm of breast: Secondary | ICD-10-CM

## 2015-10-04 ENCOUNTER — Telehealth: Payer: Self-pay | Admitting: Obstetrics & Gynecology

## 2015-10-04 NOTE — Telephone Encounter (Signed)
Prior order faxed to [facility].  Encounter closed.

## 2015-10-04 NOTE — Telephone Encounter (Signed)
Michelle Martinez from [facility] called requesting Reclast order information for DOS 06/05/15.

## 2015-10-24 ENCOUNTER — Ambulatory Visit
Admission: RE | Admit: 2015-10-24 | Discharge: 2015-10-24 | Disposition: A | Discharge status: Home / Self Care | Payer: BC Managed Care – PPO | Source: Ambulatory Visit

## 2015-10-24 DIAGNOSIS — Z1231 Encounter for screening mammogram for malignant neoplasm of breast: Secondary | ICD-10-CM

## 2015-11-09 ENCOUNTER — Other Ambulatory Visit: Payer: Self-pay

## 2015-11-09 NOTE — Telephone Encounter (Signed)
Medication refill request: Cymbalta 60 mg Last AEX:  12/08/205 MSM Next AEX: Not Scheduled Last MMG (if hormonal medication request): 10/24/2015  Refill authorized: 05/10/2015 #30 capsules 5 Refills  Today: #30 capsules 5 refills?  Please advise

## 2015-11-12 MED ORDER — DULOXETINE HCL 60 MG PO CPEP: 60.0000 mg | ORAL_CAPSULE | Freq: Every day | ORAL | Status: DC

## 2015-11-13 ENCOUNTER — Encounter: Payer: Self-pay | Admitting: Obstetrics & Gynecology

## 2015-11-13 ENCOUNTER — Ambulatory Visit (INDEPENDENT_AMBULATORY_CARE_PROVIDER_SITE_OTHER): Payer: BC Managed Care – PPO | Admitting: Obstetrics & Gynecology

## 2015-11-13 VITALS — BP 122/78 | HR 80 | Resp 14 | Ht 62.5 in | Wt 129.0 lb

## 2015-11-13 DIAGNOSIS — Z1211 Encounter for screening for malignant neoplasm of colon: Secondary | ICD-10-CM | POA: Diagnosis not present

## 2015-11-13 DIAGNOSIS — Z01419 Encounter for gynecological examination (general) (routine) without abnormal findings: Secondary | ICD-10-CM

## 2015-11-13 DIAGNOSIS — Z0000 Encounter for general adult medical examination without abnormal findings: Secondary | ICD-10-CM

## 2015-11-13 DIAGNOSIS — M810 Age-related osteoporosis without current pathological fracture: Secondary | ICD-10-CM

## 2015-11-13 DIAGNOSIS — M19049 Primary osteoarthritis, unspecified hand: Secondary | ICD-10-CM | POA: Diagnosis not present

## 2015-11-13 DIAGNOSIS — M1810 Unilateral primary osteoarthritis of first carpometacarpal joint, unspecified hand: Secondary | ICD-10-CM

## 2015-11-13 LAB — COMPREHENSIVE METABOLIC PANEL
ALT: 22 U/L (ref 6–29)
AST: 22 U/L (ref 10–35)
Albumin: 4.3 g/dL (ref 3.6–5.1)
Alkaline Phosphatase: 51 U/L (ref 33–130)
BUN: 13 mg/dL (ref 7–25)
CO2: 26 mmol/L (ref 20–31)
Calcium: 9.5 mg/dL (ref 8.6–10.4)
Chloride: 103 mmol/L (ref 98–110)
Creat: 0.8 mg/dL (ref 0.50–0.99)
Glucose, Bld: 85 mg/dL (ref 65–99)
Potassium: 4.2 mmol/L (ref 3.5–5.3)
Sodium: 140 mmol/L (ref 135–146)
Total Bilirubin: 0.4 mg/dL (ref 0.2–1.2)
Total Protein: 6.4 g/dL (ref 6.1–8.1)

## 2015-11-13 LAB — LIPID PANEL
Cholesterol: 287 mg/dL — ABNORMAL HIGH (ref 125–200)
HDL: 94 mg/dL (ref >=46)
LDL Cholesterol: 178 mg/dL — ABNORMAL HIGH (ref <130)
Total CHOL/HDL Ratio: 3.1 Ratio (ref <=5.0)
Triglycerides: 75 mg/dL (ref <150)
VLDL: 15 mg/dL (ref <30)

## 2015-11-13 LAB — HEMOGLOBIN, FINGERSTICK: Hemoglobin, fingerstick: 14 g/dL (ref 12.0–16.0)

## 2015-11-13 LAB — TSH: TSH: 0.875 u[IU]/mL (ref 0.350–4.500)

## 2015-11-13 MED ORDER — ESTROGENS, CONJUGATED 0.625 MG/GM VA CREA: TOPICAL_CREAM | VAGINAL | Status: DC

## 2015-11-13 NOTE — Progress Notes (Signed)
Patient ID: RITTIE MARUT, female   DOB: 01/27/1951, 65 y.o.    MRN: [id number]   64 y.o. [id number] MarriedCaucasianF here for annual exam.  Hospice has gotten involved with her husband's care.  Has ALS.  He was in the [place] clinic at [facility].  Under a lot of stress and worried about memory.  She knows, of course, that stress can contribute.  D/w pt neurology evaluation.    Denies vaginal bleeding.    Having issues with her hands, specifically her thumb joints.  Saw Dr. Fredna Dow but can't get his office to return her phone calls.    PCP:  Dr. Inda Merlin  No LMP recorded. Patient has had a hysterectomy.          Sexually active: No.  The current method of family planning is status post hysterectomy.    Exercising: Yes.    walking Smoker:  Former smoker  Health Maintenance: Pap:  Years ago History of abnormal Pap:  no MMG:  10-24-15 WNL  Colonoscopy:  2010 repeat in 5 yrs - not done yet  BMD:   10-04-13 Osteoporosis, BMD due TDaP:  07-29-13  Zostavax: compelted Screening Labs: labs drawn today, Hb today: 14.0   reports that she quit smoking about 38 years ago. She has never used smokeless tobacco. She reports that she drinks about 4.2 - 6.0 oz of alcohol per week. She reports that she does not use illicit drugs.  Past Medical History  Diagnosis Date  . Arthritis   . Hyperlipidemia   . Palpitation   . Fatigue   . Complication of anesthesia     HYPOTENSION , DIFFICULTY WAKING UP   . PONV (postoperative nausea and vomiting)   . Syncope     SEES DR BRACKBILL   . Hypotension   . Dysrhythmia     OCC RAPID HEART BEAT  . Headache(784.0)   . Frequency of urination   . Melanoma ([place])   . Osteoporosis     Past Surgical History  Procedure Laterality Date  . Cesarean section    . Cesarean section    . Total abdominal hysterectomy w/ bilateral salpingoophorectomy      Part of one ovary remians  . Neck surgery    . Back surgery      mid back  . Lumbar fusion  12/15/2012    Dr Vertell Limber  .  Abdominal hysterectomy      Current Outpatient Prescriptions  Medication Sig Dispense Refill  . acetaminophen (TYLENOL) 500 MG tablet Take 1,000 mg by mouth every 6 (six) hours as needed.    . calcium-vitamin D (OSCAL WITH D) 500-200 MG-UNIT per tablet Take 3 tablets by mouth 3 (three) times daily.    Marland Kitchen conjugated estrogens (PREMARIN) vaginal cream 1/2 gram vaginally twice weekly and small amt to urethra daily. 30 g 5  . DULoxetine (CYMBALTA) 60 MG capsule Take 1 capsule (60 mg total) by mouth daily. 30 capsule 1  . Multiple Vitamin (MULTIVITAMIN WITH MINERALS) TABS Take 1 tablet by mouth daily.    . pravastatin (PRAVACHOL) 20 MG tablet Take 1 tablet (20 mg total) by mouth every evening. 90 tablet 3  . zoledronic acid (RECLAST) 5 MG/100ML SOLN injection Inject 5 mg into the vein once.    . ARTHROTEC 75 75-200 MG-MCG per tablet Take 1 tablet by mouth Twice daily. Reported on 11/13/2015     No current facility-administered medications for this visit.    Family History  Problem Relation Age of Onset  .  Cancer Mother   . Heart attack Father   . Cancer Father     ROS:  Pertinent items are noted in HPI.  Otherwise, a comprehensive ROS was negative.  Exam:   BP 122/78 mmHg  Pulse 80  Resp 14  Ht 5' 2.5" (1.588 m)  Wt 129 lb (58.514 kg)  BMI 23.20 kg/m2  Weight change: +5#   Height: 5' 2.5" (158.8 cm)  Ht Readings from Last 3 Encounters:  11/13/15 5' 2.5" (1.588 m)  06/05/15 5\' 2"  (1.575 m)  09/26/14 5' 2.5" (1.588 m)    General appearance: alert, cooperative and appears stated age Head: Normocephalic, without obvious abnormality, atraumatic Neck: no adenopathy, supple, symmetrical, trachea midline and thyroid normal to inspection and palpation Lungs: clear to auscultation bilaterally Breasts: normal appearance, no masses or tenderness Heart: regular rate and rhythm Abdomen: soft, non-tender; bowel sounds normal; no masses,  no organomegaly Extremities: extremities normal,  atraumatic, no cyanosis or edema Skin: Skin color, texture, turgor normal. No rashes or lesions Lymph nodes: Cervical, supraclavicular, and axillary nodes normal. No abnormal inguinal nodes palpated Neurologic: Grossly normal  Pelvic: External genitalia:  no lesions              Urethra:  normal appearing urethra with no masses, tenderness or lesions              Bartholins and Skenes: normal                 Vagina: normal appearing vagina with normal color and discharge, no lesions              Cervix: absent              Pap taken: No. Bimanual Exam:  Uterus:  uterus absent              Adnexa: no mass, fullness, tenderness               Rectovaginal: Confirms               Anus:  normal sphincter tone, no lesions  Chaperone was present for exam.  A:  Well Woman with normal exam PMP, no HRT Vaginal atrophy OAB H/O TAH/USO/and partial other ovary removal Stressors with husband's diagnosis--ALS Insomnia Concerns with memory Thumb joint pain  P: Mammogram yearly. D/W pt doing 3D MMG due to dense breasts Reclast yearly. Due in the spring. Will need lab work before scheduling.  Will plan BMD before that time.  Order placed.   Premarin vaginal cream to urethra twice weekly.  Rx to pharmacy. pap smear not indicated. Pt states she just cannot do the colonoscopy this year.  IFOB given. CMP, TSH, Lipid, Vit D Referral to Dr. Lynann Bologna return annually or prn

## 2015-11-13 NOTE — Patient Instructions (Signed)
Bone density order was placed.  [facility] and schedule this at your convenience.  Please do the stool test for blood and send it or drop it by.   We will call with the referral information for Dr. Zerita Boers

## 2015-11-14 LAB — VITAMIN D 25 HYDROXY (VIT D DEFICIENCY, FRACTURES): Vit D, 25-Hydroxy: 35 ng/mL (ref 30–100)

## 2015-11-19 LAB — FECAL OCCULT BLOOD, IMMUNOCHEMICAL: IFOBT: NEGATIVE

## 2016-01-11 ENCOUNTER — Other Ambulatory Visit: Payer: Self-pay | Admitting: Obstetrics & Gynecology

## 2016-01-11 NOTE — Telephone Encounter (Signed)
Medication refill request: Cymbalta  Last AEX:  09/12/16 SM Next AEX: 02/27/17 SM Last MMG (if hormonal medication request): 10/24/15 BIRADS1:neg Refill authorized: 11/12/15 #30caps/1R. Today please advise.

## 2016-09-15 ENCOUNTER — Other Ambulatory Visit: Payer: Self-pay | Admitting: Obstetrics & Gynecology

## 2016-09-15 ENCOUNTER — Other Ambulatory Visit: Payer: Self-pay | Admitting: Physical Medicine and Rehabilitation

## 2016-09-15 DIAGNOSIS — Z1231 Encounter for screening mammogram for malignant neoplasm of breast: Secondary | ICD-10-CM

## 2016-10-28 ENCOUNTER — Ambulatory Visit
Admission: RE | Admit: 2016-10-28 | Discharge: 2016-10-28 | Disposition: A | Discharge status: Home / Self Care | Payer: BC Managed Care – PPO | Source: Ambulatory Visit | Attending: Obstetrics & Gynecology | Admitting: Obstetrics & Gynecology

## 2016-10-28 DIAGNOSIS — M810 Age-related osteoporosis without current pathological fracture: Secondary | ICD-10-CM

## 2016-10-28 DIAGNOSIS — Z1231 Encounter for screening mammogram for malignant neoplasm of breast: Secondary | ICD-10-CM

## 2016-10-29 ENCOUNTER — Other Ambulatory Visit: Payer: Self-pay | Admitting: Obstetrics & Gynecology

## 2016-10-29 DIAGNOSIS — R928 Other abnormal and inconclusive findings on diagnostic imaging of breast: Secondary | ICD-10-CM

## 2016-10-31 ENCOUNTER — Ambulatory Visit
Admission: RE | Admit: 2016-10-31 | Discharge: 2016-10-31 | Disposition: A | Discharge status: Home / Self Care | Payer: BC Managed Care – PPO | Source: Ambulatory Visit | Attending: Obstetrics & Gynecology | Admitting: Obstetrics & Gynecology

## 2016-10-31 ENCOUNTER — Other Ambulatory Visit: Payer: Self-pay | Admitting: Obstetrics & Gynecology

## 2016-10-31 DIAGNOSIS — R928 Other abnormal and inconclusive findings on diagnostic imaging of breast: Secondary | ICD-10-CM

## 2016-10-31 DIAGNOSIS — N6310 Unspecified lump in the right breast, unspecified quadrant: Secondary | ICD-10-CM

## 2016-10-31 DIAGNOSIS — N6320 Unspecified lump in the left breast, unspecified quadrant: Secondary | ICD-10-CM

## 2016-11-04 ENCOUNTER — Ambulatory Visit
Admission: RE | Admit: 2016-11-04 | Discharge: 2016-11-04 | Disposition: A | Discharge status: Home / Self Care | Payer: BC Managed Care – PPO | Source: Ambulatory Visit | Attending: Obstetrics & Gynecology | Admitting: Obstetrics & Gynecology

## 2016-11-04 ENCOUNTER — Other Ambulatory Visit: Payer: Self-pay | Admitting: Obstetrics & Gynecology

## 2016-11-04 DIAGNOSIS — N6310 Unspecified lump in the right breast, unspecified quadrant: Secondary | ICD-10-CM

## 2016-11-04 DIAGNOSIS — N6320 Unspecified lump in the left breast, unspecified quadrant: Secondary | ICD-10-CM

## 2016-11-10 ENCOUNTER — Telehealth: Payer: Self-pay | Admitting: *Deleted

## 2016-11-10 NOTE — Telephone Encounter (Signed)
Call to patient. Patient notified of BMD results. Patient verbalized understanding. Patient aware of appointment on 11/20/16. Patient also asking if Dr. Sabra Heck can see recent results from [facility]. RN advised patient that Dr. Sabra Heck is able to view them. Patient agreeable.   Routing to provider for final review. Patient agreeable to disposition. Will close encounter.

## 2016-11-10 NOTE — Telephone Encounter (Signed)
-----   Message from Megan Salon, MD sent at 11/09/2016  6:56 AM EST ----- Please let pt know her BMD showed no significant change.  It's no worse in the hips but no better.  She has an appt 11/20/16.  I will plan to discuss this further with her and whether we should make a medication change.

## 2016-11-19 ENCOUNTER — Ambulatory Visit: Payer: Self-pay | Admitting: General Surgery

## 2016-11-19 DIAGNOSIS — D241 Benign neoplasm of right breast: Secondary | ICD-10-CM

## 2016-11-20 ENCOUNTER — Ambulatory Visit (INDEPENDENT_AMBULATORY_CARE_PROVIDER_SITE_OTHER): Payer: BC Managed Care – PPO | Admitting: Obstetrics & Gynecology

## 2016-11-20 ENCOUNTER — Encounter: Payer: Self-pay | Admitting: Obstetrics & Gynecology

## 2016-11-20 VITALS — BP 102/74 | HR 78 | Resp 14 | Ht 63.0 in | Wt 128.0 lb

## 2016-11-20 DIAGNOSIS — Z0000 Encounter for general adult medical examination without abnormal findings: Secondary | ICD-10-CM

## 2016-11-20 DIAGNOSIS — F4321 Adjustment disorder with depressed mood: Secondary | ICD-10-CM | POA: Diagnosis not present

## 2016-11-20 DIAGNOSIS — Z205 Contact with and (suspected) exposure to viral hepatitis: Secondary | ICD-10-CM | POA: Diagnosis not present

## 2016-11-20 DIAGNOSIS — Z01419 Encounter for gynecological examination (general) (routine) without abnormal findings: Secondary | ICD-10-CM

## 2016-11-20 DIAGNOSIS — M810 Age-related osteoporosis without current pathological fracture: Secondary | ICD-10-CM | POA: Diagnosis not present

## 2016-11-20 DIAGNOSIS — R413 Other amnesia: Secondary | ICD-10-CM

## 2016-11-20 LAB — COMPREHENSIVE METABOLIC PANEL
ALT: 20 U/L (ref 6–29)
AST: 20 U/L (ref 10–35)
Albumin: 4.4 g/dL (ref 3.6–5.1)
Alkaline Phosphatase: 47 U/L (ref 33–130)
BUN: 16 mg/dL (ref 7–25)
CO2: 27 mmol/L (ref 20–31)
Calcium: 9.2 mg/dL (ref 8.6–10.4)
Chloride: 105 mmol/L (ref 98–110)
Creat: 0.84 mg/dL (ref 0.50–0.99)
Glucose, Bld: 92 mg/dL (ref 65–99)
Potassium: 4.4 mmol/L (ref 3.5–5.3)
Sodium: 141 mmol/L (ref 135–146)
Total Bilirubin: 0.5 mg/dL (ref 0.2–1.2)
Total Protein: 6.3 g/dL (ref 6.1–8.1)

## 2016-11-20 LAB — HEPATITIS C ANTIBODY: HCV Ab: NEGATIVE

## 2016-11-20 MED ORDER — ESTRADIOL 0.1 MG/GM VA CREA: TOPICAL_CREAM | VAGINAL | 3 refills | Status: DC

## 2016-11-20 MED ORDER — BUPROPION HCL ER (XL) 150 MG PO TB24: 150.0000 mg | ORAL_TABLET | Freq: Every day | ORAL | 3 refills | Status: DC

## 2016-11-20 NOTE — Progress Notes (Addendum)
66 y.o. [id number] MarriedCaucasianF here for annual exam.  Husband died last year.  Pt reports she feels she doing well but having trouble with increased crying and emotional lability.  Feels like she doesn't care about that she thinks is not normal at this point in the grieving process.  However, she is concerned about depression.  Has been on Cymbalta for several years.  Would like to consider additional treatment options.  Having arthritis issues with her thumb joints.  She is going to have her right thumb joint replaced sometime this year.    Pt reports she continues to worry about her memory.  We discussed this last year but she attributed it to the worsening of her husband who had ALS.  She now has friends who comment on it and call her "scatter brained".  We discussed options last year for evaluating this but she was not interested in this at that time.  She feels differently today.  No LMP recorded. Patient has had a hysterectomy.          Sexually active: No.  The current method of family planning is status post hysterectomy.  Exercising: Yes.    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 Marlou Starks.  Surgery is being scheduled. Colonoscopy:  2011  BMD:  10/28/16 osteoporosis  TDaP:  07/29/13  Pneumonia vaccine(s):  Not done Zostavax:   Not done Hep C testing: drawn today Screening Labs: drawn today, Hb today: drawn today, Urine today: declined   reports that she quit smoking about 39 years ago. She has never used smokeless tobacco. She reports that she drinks about 4.2 - 6.0 oz of alcohol per week . She reports that she does not use drugs.  Past Medical History:  Diagnosis Date  . Arthritis   . Complication of anesthesia    HYPOTENSION , DIFFICULTY WAKING UP   . Dysrhythmia    OCC RAPID HEART BEAT  . Fatigue   . Frequency of urination   . Headache(784.0)   .  Hyperlipidemia   . Hypotension   . Melanoma ([place])   . Osteoporosis   . Palpitation   . PONV (postoperative nausea and vomiting)   . Syncope    SEES DR Mare Ferrari     Past Surgical History:  Procedure Laterality Date  . ABDOMINAL HYSTERECTOMY    . BACK SURGERY     mid back  . CESAREAN SECTION    . CESAREAN SECTION    . LUMBAR FUSION  12/15/2012   Dr Vertell Limber  . NECK SURGERY    . TOTAL ABDOMINAL HYSTERECTOMY W/ BILATERAL SALPINGOOPHORECTOMY     Part of one ovary remians    Current Outpatient Prescriptions  Medication Sig Dispense Refill  . acetaminophen (TYLENOL) 500 MG tablet Take 1,000 mg by mouth every 6 (six) hours as needed.    . ARTHROTEC 75 75-200 MG-MCG per tablet Take 1 tablet by mouth Twice daily. Reported on 11/13/2015    . calcium-vitamin D (OSCAL WITH D) 500-200 MG-UNIT per tablet Take 3 tablets by mouth 3 (three) times daily.    Marland Kitchen conjugated estrogens (PREMARIN) vaginal cream 1/2 gram vaginally twice weekly and small amt to urethra daily. 30 g 5  . DULoxetine (CYMBALTA) 60 MG capsule take 1 capsule by mouth once daily 30 capsule 12  . Multiple Vitamin (MULTIVITAMIN WITH MINERALS) TABS Take 1 tablet by mouth  daily.    . pravastatin (PRAVACHOL) 20 MG tablet Take 1 tablet (20 mg total) by mouth every evening. 90 tablet 3  . zoledronic acid (RECLAST) 5 MG/100ML SOLN injection Inject 5 mg into the vein once.     No current facility-administered medications for this visit.     Family History  Problem Relation Age of Onset  . Cancer Mother   . Heart attack Father   . Cancer Father     ROS:  Pertinent items are noted in HPI.  Otherwise, a comprehensive ROS was negative.  Exam:   BP 102/74 (BP Location: Right Arm, Patient Position: Sitting, Cuff Size: Normal)   Pulse 78   Resp 14   Ht 5\' 3"  (1.6 m)   Wt 128 lb (58.1 kg)   BMI 22.67 kg/m   Weight change: -1#    Height: 5\' 3"  (160 cm)  Ht Readings from Last 3 Encounters:  11/20/16 5\' 3"  (1.6 m)  11/13/15 5' 2.5"  (1.588 m)  06/05/15 5\' 2"  (1.575 m)   General appearance: alert, cooperative and appears stated age Head: Normocephalic, without obvious abnormality, atraumatic Neck: no adenopathy, supple, symmetrical, trachea midline and thyroid normal to inspection and palpation Lungs: clear to auscultation bilaterally Breasts: normal appearance, no masses or tenderness Heart: regular rate and rhythm Abdomen: soft, non-tender; bowel sounds normal; no masses,  no organomegaly Extremities: extremities normal, atraumatic, no cyanosis or edema Skin: Skin color, texture, turgor normal. No rashes or lesions Lymph nodes: Cervical, supraclavicular, and axillary nodes normal. No abnormal inguinal nodes palpated Neurologic: Grossly normal   Pelvic: External genitalia:  no lesions              Urethra:  normal appearing urethra with no masses, tenderness or lesions              Bartholins and Skenes: normal                 Vagina: normal appearing vagina with normal color and discharge, no lesions              Cervix: absent              Pap taken: No. Bimanual Exam:  Uterus:  uterus absent              Adnexa: no mass, fullness, tenderness               Rectovaginal: Confirms               Anus:  normal sphincter tone, no lesions  Beck Depression Inventory performed today.  Sore:  16.  Chaperone was present for exam.  A:    Well Woman with normal exam PMP, no HRT Vaginal atrophy, uses vaginal estrogen OAB H/O TAH/USO/and partial other ovary removal Grief reaction with husband's death with some decreased mood Concerns with memory loss, forgetfulness Joint arthritis Osteoporosis  P: Mammogram yearly. D/W pt doing 3D MMG due to dense breasts. Reclast yearly. This was due in the fall.  Pt's recent BMD was stable.  Will continue Reclast for next two years and repeat BMD.  Hopefully will be able to do a drug holiday then.    Estradiol vaginal cream to urethra twice weekly.  Rx to  pharmacy. pap smear not indicated. Pt states she just cannot do the colonoscopy this year.  IFOB given.  Discussed with pt doing Cologard.  Will look into this for pt.  CMP, Vit D Hep C antibody obtained today  Wellbutrin Xl 150mg  daily.  #30/1RF.  Recheck 1 month. Referral to Dr. Jannifer Franklin Return annually or prn  In addition to AEX, additional 20 minutes spent in face to face discussion about her osteoporosis, recent BMD, grief reaction, mood changes, and memory concerns.

## 2016-11-21 ENCOUNTER — Other Ambulatory Visit: Payer: Self-pay | Admitting: General Surgery

## 2016-11-21 DIAGNOSIS — D241 Benign neoplasm of right breast: Secondary | ICD-10-CM

## 2016-11-21 LAB — VITAMIN D 25 HYDROXY (VIT D DEFICIENCY, FRACTURES): Vit D, 25-Hydroxy: 33 ng/mL (ref 30–100)

## 2016-12-02 ENCOUNTER — Telehealth: Payer: Self-pay

## 2016-12-02 NOTE — Telephone Encounter (Signed)
Spoke with patient. Advised of all information regarding Cologuard as seen below. Patient is agreeable. Cologuard form to the front. Patient will come in today or tomorrow to review and sign form.  Routing to provider for final review. Patient agreeable to disposition. Will close encounter.

## 2016-12-02 NOTE — Telephone Encounter (Signed)
Left message to call [place] at [phone].  Need to advise Cologuard request form has been completed with Dr.Miller. This form will need to be reviewed and signed by the patient. She may come by the office at her convenience. Form will be faxed to [employer] after completion. [employer] will contact the patient to review benefit coverage with her insurance before proceeding. She will need to notify the office once she decides to proceed or decline Cologuard based on benefit information. If she agrees Chief Strategy Officer will send her Cologuard in the mail and will explain instructions for use. She will send this back to the address provided. Once she has sent this back she will need to notify Dr.Miller so we are aware this has been completed. Dr.Miller will receive results and she will be contacted to discuss.

## 2016-12-03 NOTE — Pre-Procedure Instructions (Signed)
Michelle Martinez  12/03/2016      [facility] [place] [place] [id number] [place] [id number] Phone: [phone] Fax: [phone]  [facility] [address] [place], [place] [address] [place] [address] Phone: [phone] Fax: [phone]    Your procedure is scheduled on 12-11-2016    Thursday   Report to [facility] Admitting at 7:00 A.M.   Call this number if you have problems the morning of surgery:  [phone]   Remember:   Do not eat food or drink liquids after midnight.   Take these medicines the morning of surgery with A SIP OF WATER Tylenol if needed,bupropion(Wellbutrin),Duloxetine(Cymbalta),Flonase nasal spray,Loratadine(Claritin),   STOP ASPIRIN,ANTIINFLAMATORIES (IBUPROFEN,ALEVE,MOTRIN,ADVIL,GOODY'S POWDERS),HERBAL SUPPLEMENTS,FISH OIL,AND VITAMINS 5-7 DAYS PRIOR TO SURGERY   Two hours prior to arrival time 5:00 AM Drink the box of Boost you were given.[place] - Preparing for Surgery  Before surgery, you can play an important role.  Because skin is not sterile, your skin needs to be as free of germs as possible.  You can reduce the number of germs on you skin by washing with CHG (chlorahexidine gluconate) soap before surgery.  CHG is an antiseptic cleaner which kills germs and bonds with the skin to continue killing germs even after washing.  Please DO NOT use if you have an allergy to CHG or antibacterial soaps.  If your skin becomes reddened/irritated stop using the CHG and inform your nurse when you arrive at Short Stay.  Do not shave (including legs and underarms) for at least 48 hours prior to the first CHG shower.  You may shave your face.  Please follow these instructions carefully:   1.  Shower with CHG Soap the night before surgery and the                                morning of Surgery.  2.  If you choose to wash your hair,  wash your hair first as usual with your       normal shampoo.  3.  After you shampoo, rinse your hair and body thoroughly to remove the                      Shampoo.  4.  Use CHG as you would any other liquid soap.  You can apply chg directly to the skin and wash gently with scrungie or a clean washcloth.  5.  Apply the CHG Soap to your body ONLY FROM THE NECK DOWN.        Do not use on open wounds or open sores.  Avoid contact with your eyes,       ears, mouth and genitals (private parts).  Wash genitals (private parts)       with your normal soap.  6.  Wash thoroughly, paying special attention to the area where your surgery  will be performed.  7.  Thoroughly rinse your body with warm water from the neck down.  8.  DO NOT shower/wash with your normal soap after using and rinsing off       the CHG Soap.  9.  Pat yourself dry with a clean towel.            10.  Wear clean pajamas.            11.  Place  clean sheets on your bed the night of your first shower and do not        sleep with pets.  Day of Surgery  Do not apply any lotions/deoderants the morning of surgery.  Please wear clean clothes to the hospital/surgery center.       Do not wear jewelry, make-up or nail polish.   Do not wear lotions, powders, or perfumes, or deodorant .  Do not shave 48 hours prior to surgery.    Do not bring valuables to the hospital.   [facility] is not responsible for any belongings or valuables.  Contacts, dentures or bridgework may not be worn into surgery.  Leave your suitcase in the car.  After surgery it may be brought to your room.  For patients admitted to the hospital, discharge time will be determined by your treatment team.  Patients discharged the day of surgery will not be allowed to drive home.

## 2016-12-04 ENCOUNTER — Encounter (HOSPITAL_COMMUNITY): Payer: Self-pay

## 2016-12-04 ENCOUNTER — Encounter (HOSPITAL_COMMUNITY)
Admission: RE | Admit: 2016-12-04 | Discharge: 2016-12-04 | Disposition: A | Discharge status: Home / Self Care | Payer: BC Managed Care – PPO | Source: Ambulatory Visit | Attending: General Surgery | Admitting: General Surgery

## 2016-12-04 DIAGNOSIS — Z01812 Encounter for preprocedural laboratory examination: Secondary | ICD-10-CM | POA: Insufficient documentation

## 2016-12-04 DIAGNOSIS — D241 Benign neoplasm of right breast: Secondary | ICD-10-CM | POA: Insufficient documentation

## 2016-12-04 HISTORY — DX: Anxiety disorder, unspecified: F41.9

## 2016-12-04 HISTORY — DX: Depression, unspecified: F32.A

## 2016-12-04 HISTORY — DX: Major depressive disorder, single episode, unspecified: F32.9

## 2016-12-04 LAB — BASIC METABOLIC PANEL
Anion gap: 9 (ref 5–15)
BUN: 12 mg/dL (ref 6–20)
CO2: 28 mmol/L (ref 22–32)
Calcium: 9.5 mg/dL (ref 8.9–10.3)
Chloride: 104 mmol/L (ref 101–111)
Creatinine, Ser: 0.86 mg/dL (ref 0.44–1.00)
GFR calc Af Amer: >60 mL/min (ref >60)
GFR calc non Af Amer: >60 mL/min (ref >60)
Glucose, Bld: 94 mg/dL (ref 65–99)
Potassium: 4.1 mmol/L (ref 3.5–5.1)
Sodium: 141 mmol/L (ref 135–145)

## 2016-12-04 LAB — CBC
HCT: 41.4 % (ref 36.0–46.0)
Hemoglobin: 13.4 g/dL (ref 12.0–15.0)
MCH: 29.6 pg (ref 26.0–34.0)
MCHC: 32.4 g/dL (ref 30.0–36.0)
MCV: 91.6 fL (ref 78.0–100.0)
Platelets: 175 10*3/uL (ref 150–400)
RBC: 4.52 MIL/uL (ref 3.87–5.11)
RDW: 13.7 % (ref 11.5–15.5)
WBC: 5.1 10*3/uL (ref 4.0–10.5)

## 2016-12-04 MED ORDER — CHLORHEXIDINE GLUCONATE CLOTH 2 % EX PADS: 6.0000 | MEDICATED_PAD | Freq: Once | CUTANEOUS | Status: DC

## 2016-12-09 ENCOUNTER — Ambulatory Visit
Admission: RE | Admit: 2016-12-09 | Discharge: 2016-12-09 | Disposition: A | Discharge status: Home / Self Care | Payer: BC Managed Care – PPO | Source: Ambulatory Visit | Attending: General Surgery | Admitting: General Surgery

## 2016-12-09 DIAGNOSIS — D241 Benign neoplasm of right breast: Secondary | ICD-10-CM

## 2016-12-11 ENCOUNTER — Ambulatory Visit
Admission: RE | Admit: 2016-12-11 | Discharge: 2016-12-11 | Disposition: A | Discharge status: Home / Self Care | Payer: BC Managed Care – PPO | Source: Ambulatory Visit | Attending: General Surgery | Admitting: General Surgery

## 2016-12-11 ENCOUNTER — Ambulatory Visit (HOSPITAL_COMMUNITY): Payer: BC Managed Care – PPO | Admitting: Anesthesiology

## 2016-12-11 ENCOUNTER — Ambulatory Visit (HOSPITAL_COMMUNITY)
Admission: RE | Admit: 2016-12-11 | Discharge: 2016-12-11 | Disposition: A | Discharge status: Home / Self Care | Payer: BC Managed Care – PPO | Source: Ambulatory Visit | Attending: General Surgery | Admitting: General Surgery

## 2016-12-11 ENCOUNTER — Encounter (HOSPITAL_COMMUNITY)
Admission: RE | Disposition: A | Discharge status: Home / Self Care | Payer: Self-pay | Source: Ambulatory Visit | Attending: General Surgery

## 2016-12-11 ENCOUNTER — Encounter (HOSPITAL_COMMUNITY): Payer: Self-pay | Admitting: Anesthesiology

## 2016-12-11 ENCOUNTER — Other Ambulatory Visit: Payer: Self-pay

## 2016-12-11 DIAGNOSIS — Z8249 Family history of ischemic heart disease and other diseases of the circulatory system: Secondary | ICD-10-CM | POA: Insufficient documentation

## 2016-12-11 DIAGNOSIS — Z882 Allergy status to sulfonamides status: Secondary | ICD-10-CM | POA: Diagnosis not present

## 2016-12-11 DIAGNOSIS — Z818 Family history of other mental and behavioral disorders: Secondary | ICD-10-CM | POA: Insufficient documentation

## 2016-12-11 DIAGNOSIS — F419 Anxiety disorder, unspecified: Secondary | ICD-10-CM | POA: Diagnosis not present

## 2016-12-11 DIAGNOSIS — N6011 Diffuse cystic mastopathy of right breast: Secondary | ICD-10-CM | POA: Insufficient documentation

## 2016-12-11 DIAGNOSIS — Z791 Long term (current) use of non-steroidal anti-inflammatories (NSAID): Secondary | ICD-10-CM | POA: Insufficient documentation

## 2016-12-11 DIAGNOSIS — Z85820 Personal history of malignant melanoma of skin: Secondary | ICD-10-CM | POA: Diagnosis not present

## 2016-12-11 DIAGNOSIS — D241 Benign neoplasm of right breast: Secondary | ICD-10-CM

## 2016-12-11 DIAGNOSIS — Z7951 Long term (current) use of inhaled steroids: Secondary | ICD-10-CM | POA: Insufficient documentation

## 2016-12-11 DIAGNOSIS — Z87891 Personal history of nicotine dependence: Secondary | ICD-10-CM | POA: Insufficient documentation

## 2016-12-11 DIAGNOSIS — Z803 Family history of malignant neoplasm of breast: Secondary | ICD-10-CM | POA: Insufficient documentation

## 2016-12-11 DIAGNOSIS — Z833 Family history of diabetes mellitus: Secondary | ICD-10-CM | POA: Insufficient documentation

## 2016-12-11 DIAGNOSIS — Z8261 Family history of arthritis: Secondary | ICD-10-CM | POA: Diagnosis not present

## 2016-12-11 DIAGNOSIS — M1990 Unspecified osteoarthritis, unspecified site: Secondary | ICD-10-CM | POA: Diagnosis not present

## 2016-12-11 DIAGNOSIS — Z79899 Other long term (current) drug therapy: Secondary | ICD-10-CM | POA: Diagnosis not present

## 2016-12-11 DIAGNOSIS — Z8042 Family history of malignant neoplasm of prostate: Secondary | ICD-10-CM | POA: Diagnosis not present

## 2016-12-11 HISTORY — PX: BREAST LUMPECTOMY WITH RADIOACTIVE SEED LOCALIZATION: SHX6424

## 2016-12-11 SURGERY — BREAST LUMPECTOMY WITH RADIOACTIVE SEED LOCALIZATION
Anesthesia: General | Site: Breast | Laterality: Right

## 2016-12-11 MED ORDER — FENTANYL CITRATE (PF) 100 MCG/2ML IJ SOLN
INTRAMUSCULAR | Status: AC
  Filled 2016-12-11: qty 4

## 2016-12-11 MED ORDER — DEXAMETHASONE SODIUM PHOSPHATE 10 MG/ML IJ SOLN
INTRAMUSCULAR | Status: DC | PRN
  Administered 2016-12-11: 5 mg via INTRAVENOUS

## 2016-12-11 MED ORDER — LIDOCAINE 2% (20 MG/ML) 5 ML SYRINGE
INTRAMUSCULAR | Status: DC | PRN
  Administered 2016-12-11: 60 mg via INTRAVENOUS

## 2016-12-11 MED ORDER — LACTATED RINGERS IV SOLN
INTRAVENOUS | Status: DC
  Administered 2016-12-11: 07:00:00 via INTRAVENOUS

## 2016-12-11 MED ORDER — PROPOFOL 10 MG/ML IV BOLUS
INTRAVENOUS | Status: DC | PRN
  Administered 2016-12-11: 120 mg via INTRAVENOUS

## 2016-12-11 MED ORDER — GABAPENTIN 300 MG PO CAPS
300.0000 mg | ORAL_CAPSULE | ORAL | Status: AC
  Administered 2016-12-11: 300 mg via ORAL
  Filled 2016-12-11: qty 1

## 2016-12-11 MED ORDER — MIDAZOLAM HCL 5 MG/5ML IJ SOLN
INTRAMUSCULAR | Status: DC | PRN
  Administered 2016-12-11: 1 mg via INTRAVENOUS

## 2016-12-11 MED ORDER — MEPERIDINE HCL 25 MG/ML IJ SOLN
INTRAMUSCULAR | Status: AC
  Filled 2016-12-11: qty 1

## 2016-12-11 MED ORDER — MEPERIDINE HCL 25 MG/ML IJ SOLN
6.2500 mg | INTRAMUSCULAR | Status: DC | PRN
  Administered 2016-12-11: 6.25 mg via INTRAVENOUS

## 2016-12-11 MED ORDER — TRAMADOL HCL 50 MG PO TABS: 50.0000 mg | ORAL_TABLET | Freq: Four times a day (QID) | ORAL | 0 refills | Status: DC | PRN

## 2016-12-11 MED ORDER — MIDAZOLAM HCL 2 MG/2ML IJ SOLN
INTRAMUSCULAR | Status: AC
  Filled 2016-12-11: qty 2

## 2016-12-11 MED ORDER — ACETAMINOPHEN 500 MG PO TABS
1000.0000 mg | ORAL_TABLET | ORAL | Status: AC
  Administered 2016-12-11: 1000 mg via ORAL
  Filled 2016-12-11: qty 2

## 2016-12-11 MED ORDER — PROPOFOL 10 MG/ML IV BOLUS
INTRAVENOUS | Status: AC
  Filled 2016-12-11: qty 20

## 2016-12-11 MED ORDER — ONDANSETRON HCL 4 MG/2ML IJ SOLN
INTRAMUSCULAR | Status: DC | PRN
  Administered 2016-12-11: 4 mg via INTRAVENOUS

## 2016-12-11 MED ORDER — BUPIVACAINE-EPINEPHRINE 0.25% -1:200000 IJ SOLN
INTRAMUSCULAR | Status: DC | PRN
  Administered 2016-12-11: 20 mL

## 2016-12-11 MED ORDER — FENTANYL CITRATE (PF) 100 MCG/2ML IJ SOLN: 25.0000 ug | INTRAMUSCULAR | Status: DC | PRN

## 2016-12-11 MED ORDER — FENTANYL CITRATE (PF) 100 MCG/2ML IJ SOLN
INTRAMUSCULAR | Status: DC | PRN
  Administered 2016-12-11: 50 ug via INTRAVENOUS
  Administered 2016-12-11: 25 ug via INTRAVENOUS

## 2016-12-11 MED ORDER — METOCLOPRAMIDE HCL 5 MG/ML IJ SOLN: 10.0000 mg | Freq: Once | INTRAMUSCULAR | Status: DC | PRN

## 2016-12-11 MED ORDER — CELECOXIB 200 MG PO CAPS
400.0000 mg | ORAL_CAPSULE | ORAL | Status: AC
  Administered 2016-12-11: 400 mg via ORAL
  Filled 2016-12-11: qty 2

## 2016-12-11 MED ORDER — CEFAZOLIN SODIUM-DEXTROSE 2-4 GM/100ML-% IV SOLN
2.0000 g | INTRAVENOUS | Status: AC
  Administered 2016-12-11: 2 g via INTRAVENOUS
  Filled 2016-12-11: qty 100

## 2016-12-11 MED ORDER — 0.9 % SODIUM CHLORIDE (POUR BTL) OPTIME
TOPICAL | Status: DC | PRN
  Administered 2016-12-11: 1000 mL

## 2016-12-11 MED ORDER — SCOPOLAMINE 1 MG/3DAYS TD PT72
1.0000 | MEDICATED_PATCH | Freq: Once | TRANSDERMAL | Status: DC
  Administered 2016-12-11: 1.5 mg via TRANSDERMAL
  Filled 2016-12-11: qty 1

## 2016-12-11 SURGICAL SUPPLY — 39 items
APPLIER CLIP 9.375 MED OPEN (MISCELLANEOUS) ×2
BLADE SURG 15 STRL LF DISP TIS (BLADE) ×1 IMPLANT
BLADE SURG 15 STRL SS (BLADE) ×1
CANISTER SUCTION 2500CC (MISCELLANEOUS) ×2 IMPLANT
CHLORAPREP W/TINT 26ML (MISCELLANEOUS) ×2 IMPLANT
CLIP APPLIE 9.375 MED OPEN (MISCELLANEOUS) ×1 IMPLANT
COVER PROBE W GEL 5X96 (DRAPES) ×2 IMPLANT
COVER SURGICAL LIGHT HANDLE (MISCELLANEOUS) ×2 IMPLANT
DERMABOND ADVANCED (GAUZE/BANDAGES/DRESSINGS) ×1
DERMABOND ADVANCED .7 DNX12 (GAUZE/BANDAGES/DRESSINGS) ×1 IMPLANT
DEVICE DUBIN SPECIMEN MAMMOGRA (MISCELLANEOUS) ×2 IMPLANT
DRAPE CHEST BREAST 15X10 FENES (DRAPES) ×2 IMPLANT
DRAPE UTILITY XL STRL (DRAPES) ×2 IMPLANT
ELECT COATED BLADE 2.86 ST (ELECTRODE) ×2 IMPLANT
ELECT REM PT RETURN 9FT ADLT (ELECTROSURGICAL) ×2
ELECTRODE REM PT RTRN 9FT ADLT (ELECTROSURGICAL) ×1 IMPLANT
GLOVE BIO SURGEON STRL SZ7.5 (GLOVE) ×4 IMPLANT
GLOVE BIOGEL PI IND STRL 7.0 (GLOVE) ×1 IMPLANT
GLOVE BIOGEL PI INDICATOR 7.0 (GLOVE) ×1
GLOVE SURG SS PI 7.0 STRL IVOR (GLOVE) ×2 IMPLANT
GOWN STRL REUS W/ TWL LRG LVL3 (GOWN DISPOSABLE) ×2 IMPLANT
GOWN STRL REUS W/TWL LRG LVL3 (GOWN DISPOSABLE) ×2
KIT BASIN OR (CUSTOM PROCEDURE TRAY) ×2 IMPLANT
KIT MARKER MARGIN INK (KITS) ×2 IMPLANT
LIGHT WAVEGUIDE WIDE FLAT (MISCELLANEOUS) IMPLANT
NEEDLE HYPO 25X1 1.5 SAFETY (NEEDLE) ×2 IMPLANT
NS IRRIG 1000ML POUR BTL (IV SOLUTION) ×2 IMPLANT
PACK SURGICAL SETUP 50X90 (CUSTOM PROCEDURE TRAY) ×2 IMPLANT
PENCIL BUTTON HOLSTER BLD 10FT (ELECTRODE) ×2 IMPLANT
SPONGE LAP 18X18 X RAY DECT (DISPOSABLE) ×2 IMPLANT
SUT MNCRL AB 4-0 PS2 18 (SUTURE) ×2 IMPLANT
SUT SILK 2 0 SH (SUTURE) IMPLANT
SUT VIC AB 3-0 SH 18 (SUTURE) ×2 IMPLANT
SYR BULB 3OZ (MISCELLANEOUS) ×2 IMPLANT
SYR CONTROL 10ML LL (SYRINGE) ×2 IMPLANT
TOWEL OR 17X24 6PK STRL BLUE (TOWEL DISPOSABLE) ×2 IMPLANT
TOWEL OR 17X26 10 PK STRL BLUE (TOWEL DISPOSABLE) ×2 IMPLANT
TUBE CONNECTING 12X1/4 (SUCTIONS) ×2 IMPLANT
YANKAUER SUCT BULB TIP NO VENT (SUCTIONS) ×2 IMPLANT

## 2016-12-11 NOTE — Transfer of Care (Signed)
Immediate Anesthesia Transfer of Care Note  Patient: Michelle Martinez  Procedure(s) Performed: Procedure(s): RIGHT BREAST LUMPECTOMY WITH RADIOACTIVE SEED LOCALIZATION (Right)  Patient Location: PACU  Anesthesia Type:General  Level of Consciousness: awake, alert  and patient cooperative  Airway & Oxygen Therapy: Patient Spontanous Breathing  Post-op Assessment: Report given to RN and Post -op Vital signs reviewed and stable  Post vital signs: Reviewed and stable  Last Vitals:  Vitals:   12/11/16 0717 12/11/16 0950  BP: 108/77   Pulse: 96   Resp: 20   Temp: 36.6 C (P) 36.7 C    Last Pain:  Vitals:   12/11/16 0717  TempSrc: Oral         Complications: No apparent anesthesia complications

## 2016-12-11 NOTE — H&P (Signed)
[place]  Location: [facility] Surgery Patient #: [id number] DOB: 1951/04/07 Widowed / Language: Michelle Martinez / Race: White Female   History of Present Illness The patient is a 66 year old female who presents with a breast mass. We are asked to see the patient in consultation by Dr. Hale Bogus to evaluate her for a right breast mass. The patient is a 66 year old white female who recently went for a routine screening mammogram. At that time she was found to have a mass in the lower outer subareolar right breast as well as some ductal dilation in the areolar left breast. Both of these areas were biopsied. The left side came back as dilated ducts with debris. The right breast biopsy came back as an intraductal papilloma. She denies any breast pain or discharge from the nipple. She does have a family history of breast cancer in her mother who also had a glioblastoma. Her father died of bladder cancer. She is otherwise in reasonably good health. She does have pretty significant arthritis.   Past Surgical History  Breast Biopsy  Bilateral. Cesarean Section - Multiple  Oral Surgery  Spinal Surgery - Lower Back  Spinal Surgery - Neck  Spinal Surgery Midback  Tonsillectomy   Diagnostic Studies History  Colonoscopy  1-5 years ago Mammogram  within last year Pap Smear  1-5 years ago  Allergies  Sulfur *CHEMICALS*   Medication History  DULoxetine HCl (60MG  Capsule DR Part, Oral) Active. Arthrotec (75-0.2MG  Tablet DR, Oral) Active. Loratadine (10MG  Capsule, Oral) Active. Flonase (50MCG/ACT Suspension, Nasal) Active. Medications Reconciled  Social History  Alcohol use  Occasional alcohol use. Caffeine use  Coffee, Tea. No drug use  Tobacco use  Former smoker.  Family History  Arthritis  Father. Breast Cancer  Mother. Cancer  Mother. Depression  Mother. Diabetes Mellitus  Father. Heart Disease  Father. Hypertension  Father. Prostate  Cancer  Father.  Pregnancy / Birth History  Age at menarche  54 years. Contraceptive History  Oral contraceptives. Gravida  4 Length (months) of breastfeeding  3-6 Maternal age  46-25 Para  49  Other Problems  Anxiety Disorder  Arthritis  Back Pain  General anesthesia - complications  Lump In Breast  Melanoma     Review of Systems  General Present- Fatigue. Not Present- Appetite Loss, Chills, Fever, Night Sweats, Weight Gain and Weight Loss. Skin Not Present- Change in Wart/Mole, Dryness, Hives, Jaundice, New Lesions, Non-Healing Wounds, Rash and Ulcer. HEENT Present- Seasonal Allergies, Sinus Pain and Wears glasses/contact lenses. Not Present- Earache, Hearing Loss, Hoarseness, Nose Bleed, Oral Ulcers, Ringing in the Ears, Sore Throat, Visual Disturbances and Yellow Eyes. Breast Present- Breast Mass. Not Present- Breast Pain, Nipple Discharge and Skin Changes. Cardiovascular Present- Palpitations. Not Present- Chest Pain, Difficulty Breathing Lying Down, Leg Cramps, Rapid Heart Rate, Shortness of Breath and Swelling of Extremities. Gastrointestinal Present- Hemorrhoids. Not Present- Abdominal Pain, Bloating, Bloody Stool, Change in Bowel Habits, Chronic diarrhea, Constipation, Difficulty Swallowing, Excessive gas, Gets full quickly at meals, Indigestion, Nausea, Rectal Pain and Vomiting. Female Genitourinary Present- Urgency. Not Present- Frequency, Nocturia, Painful Urination and Pelvic Pain. Musculoskeletal Present- Back Pain, Joint Pain, Joint Stiffness, Muscle Pain, Muscle Weakness and Swelling of Extremities. Neurological Present- Decreased Memory. Not Present- Fainting, Headaches, Numbness, Seizures, Tingling, Tremor, Trouble walking and Weakness. Psychiatric Present- Anxiety and Change in Sleep Pattern. Not Present- Bipolar, Depression, Fearful and Frequent crying. Endocrine Present- Hot flashes. Not Present- Cold Intolerance, Excessive Hunger, Hair Changes, Heat  Intolerance and New Diabetes. Hematology  Not Present- Blood Thinners, Easy Bruising, Excessive bleeding, Gland problems, HIV and Persistent Infections.  Vitals  Weight: 127.5 lb Height: 62.5in Body Surface Area: 1.59 m Body Mass Index: 22.95 kg/m  BP: 122/74 (Sitting, Left Arm, Standard)       Physical Exam  General Mental Status-Alert. General Appearance-Consistent with stated age. Hydration-Well hydrated. Voice-Normal.  Head and Neck Head-normocephalic, atraumatic with no lesions or palpable masses. Trachea-midline. Thyroid Gland Characteristics - normal size and consistency.  Eye Eyeball - Bilateral-Extraocular movements intact. Sclera/Conjunctiva - Bilateral-No scleral icterus.  Chest and Lung Exam Chest and lung exam reveals -quiet, even and easy respiratory effort with no use of accessory muscles and on auscultation, normal breath sounds, no adventitious sounds and normal vocal resonance. Inspection Chest Wall - Normal. Back - normal.  Breast Note: There is no palpable mass in either breast. There is no palpable axillary, supraclavicular, or cervical lymphadenopathy.   Cardiovascular Cardiovascular examination reveals -normal heart sounds, regular rate and rhythm with no murmurs and normal pedal pulses bilaterally.  Abdomen Inspection Inspection of the abdomen reveals - No Hernias. Skin - Scar - no surgical scars. Palpation/Percussion Palpation and Percussion of the abdomen reveal - Soft, Non Tender, No Rebound tenderness, No Rigidity (guarding) and No hepatosplenomegaly. Auscultation Auscultation of the abdomen reveals - Bowel sounds normal.  Neurologic Neurologic evaluation reveals -alert and oriented x 3 with no impairment of recent or remote memory. Mental Status-Normal.  Musculoskeletal Normal Exam - Left-Upper Extremity Strength Normal and Lower Extremity Strength Normal. Normal Exam - Right-Upper Extremity  Strength Normal and Lower Extremity Strength Normal.  Lymphatic Head & Neck  General Head & Neck Lymphatics: Bilateral - Description - Normal. Axillary  General Axillary Region: Bilateral - Description - Normal. Tenderness - Non Tender. Femoral & Inguinal  Generalized Femoral & Inguinal Lymphatics: Bilateral - Description - Normal. Tenderness - Non Tender.    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 Because of its abnormal appearance on mammogram I would recommend that this area be removed. She would also like to have this done. I have discussed with her in detail the risks and benefits of the operation to remove this area as well as some of the technical aspects and she understands and wishes to proceed. I will plan for a right breast radioactive seed localized lumpectomy Current Plans Pt Education - Breast Diseases: discussed with patient and provided information.

## 2016-12-11 NOTE — Anesthesia Postprocedure Evaluation (Signed)
Anesthesia Post Note  Patient: LEANDRIA MCCOSH  Procedure(s) Performed: Procedure(s) (LRB): RIGHT BREAST LUMPECTOMY WITH RADIOACTIVE SEED LOCALIZATION (Right)  Patient location during evaluation: PACU Anesthesia Type: General Level of consciousness: awake and alert and oriented Pain management: pain level controlled Vital Signs Assessment: post-procedure vital signs reviewed and stable Respiratory status: spontaneous breathing, nonlabored ventilation and respiratory function stable Cardiovascular status: blood pressure returned to baseline and stable Postop Assessment: no signs of nausea or vomiting Anesthetic complications: no       Last Vitals:  Vitals:   12/11/16 0950 12/11/16 1005  BP: (!) 147/90 117/81  Pulse: (!) 105 95  Resp: 16 (!) 25  Temp: 36.7 C     Last Pain:  Vitals:   12/11/16 0717  TempSrc: Oral                 FOSTER,MICHAEL A.

## 2016-12-11 NOTE — Op Note (Signed)
12/11/2016  9:48 AM  PATIENT:  Michelle Martinez  66 y.o. female  PRE-OPERATIVE DIAGNOSIS:  RIGHT BREAST INTRADUCTAL PAPILLOMA  POST-OPERATIVE DIAGNOSIS:  RIGHT BREAST INTRADUCTAL PAPILLOMA  PROCEDURE:  Procedure(s): RIGHT BREAST LUMPECTOMY WITH RADIOACTIVE SEED LOCALIZATION (Right)  SURGEON:  Surgeon(s) and Role:    * Jovita Kussmaul, MD - Primary  PHYSICIAN ASSISTANT:   ASSISTANTS: none   ANESTHESIA:   local and general  EBL:  Total I/O In: 800 [I.V.:800] Out: 20 [Blood:20]  BLOOD ADMINISTERED:none  DRAINS: none   LOCAL MEDICATIONS USED:  MARCAINE     SPECIMEN:  Source of Specimen:  right breast tissue  DISPOSITION OF SPECIMEN:  PATHOLOGY  COUNTS:  YES  TOURNIQUET:  * No tourniquets in log *  DICTATION: .Dragon Dictation   After informed consent was obtained the patient was brought to the operating room and placed in the supine position on the operating room table. After adequate induction of general anesthesia the patient's right breast was prepped with ChloraPrep, allowed to dry, and draped in usual sterile manner. An appropriate timeout was performed. Previously an I-125 seed was placed in the lower outer subareolar right breast to mark an area of an intraductal papilloma. The neoprobe was sent to I-125 in the area of radioactivity was readily identified. A curvilinear incision was then made along the lower outer edge of the areola with a 15 blade knife. The incision was carried through the skin and subcutaneous tissue sharply with electrocautery. The dissection was then carried towards the radioactive seed under the direction of the neoprobe. Once I approached the radioactive seed then a circular portion of breast tissue was excised sharply around the radioactive seed while checking the area of radioactivity frequently with the neoprobe. Once the specimen was removed it was oriented with the appropriate paint colors. A specimen radiograph was then obtained that showed  the clip and seed to be within the specimen. The specimen was then sent to pathology for further evaluation. Hemostasis was achieved using the Bovie electrocautery. Of note there were multiple dilated ducts with debris encountered. The wound was then infiltrated with quarter percent Marcaine and irrigated with copious amounts of saline. The deep layer of the wound was then closed with layers of interrupted 3-0 Vicryl stitches. The skin was then closed with interrupted 4-0 Monocryl subcuticular stitches. Dermabond dressings were applied. The patient tolerated the procedure well. At the end of the case all needle sponge and instrument counts were correct. The patient was then awakened and taken to recovery in stable condition.  PLAN OF CARE: Discharge to home after PACU  PATIENT DISPOSITION:  PACU - hemodynamically stable.   Delay start of Pharmacological VTE agent (>24hrs) due to surgical blood loss or risk of bleeding: not applicable

## 2016-12-11 NOTE — Anesthesia Procedure Notes (Signed)
Procedure Name: LMA Insertion Date/Time: 12/11/2016 9:04 AM Performed by: Salli Quarry WEAVER Pre-anesthesia Checklist: Patient identified, Emergency Drugs available, Suction available and Patient being monitored Patient Re-evaluated:Patient Re-evaluated prior to inductionOxygen Delivery Method: Circle System Utilized Preoxygenation: Pre-oxygenation with 100% oxygen Intubation Type: IV induction LMA: LMA inserted LMA Size: 3.0 Number of attempts: 2 Placement Confirmation: positive ETCO2 Tube secured with: Tape Dental Injury: Teeth and Oropharynx as per pre-operative assessment  Comments: Attempt x1 with LMA 4 - unable to obtain adequate tidal volumes, Attempt x2 with LMA 3, +ETCO2, BBS=, adequate tidal volumes.

## 2016-12-11 NOTE — Anesthesia Preprocedure Evaluation (Signed)
Anesthesia Evaluation  Patient identified by MRN, date of birth, ID band Patient awake    Reviewed: Allergy & Precautions, NPO status , Patient's Chart, lab work & pertinent test results  History of Anesthesia Complications (+) PONV and history of anesthetic complications  Airway Mallampati: II  TM Distance: >3 FB Neck ROM: Full    Dental  (+) Teeth Intact   Pulmonary former smoker,    Pulmonary exam normal breath sounds clear to auscultation       Cardiovascular negative cardio ROS Normal cardiovascular exam+ dysrhythmias  Rhythm:Regular Rate:Normal     Neuro/Psych  Headaches, PSYCHIATRIC DISORDERS Anxiety Depression    GI/Hepatic negative GI ROS, Neg liver ROS,   Endo/Other  Right breast intraductal papilloma  Renal/GU negative Renal ROS  negative genitourinary   Musculoskeletal  (+) Arthritis ,   Abdominal   Peds  Hematology negative hematology ROS (+)   Anesthesia Other Findings   Reproductive/Obstetrics                             Anesthesia Physical Anesthesia Plan  ASA: II  Anesthesia Plan: General   Post-op Pain Management:    Induction: Intravenous  Airway Management Planned: LMA  Additional Equipment:   Intra-op Plan:   Post-operative Plan: Extubation in OR  Informed Consent: I have reviewed the patients History and Physical, chart, labs and discussed the procedure including the risks, benefits and alternatives for the proposed anesthesia with the patient or authorized representative who has indicated his/her understanding and acceptance.     Plan Discussed with: Anesthesiologist, CRNA and Surgeon  Anesthesia Plan Comments:         Anesthesia Quick Evaluation

## 2016-12-12 ENCOUNTER — Encounter (HOSPITAL_COMMUNITY): Payer: Self-pay | Admitting: General Surgery

## 2016-12-22 ENCOUNTER — Other Ambulatory Visit: Payer: Self-pay | Admitting: Obstetrics & Gynecology

## 2016-12-22 NOTE — Telephone Encounter (Signed)
Medication refill request: DULoxetine 60mg  Last AEX:  11/20/16 SM Next AEX: 02/26/18 Last MMG (if hormonal medication request): see EPIC Refill authorized: 01/11/16 #30 w/12 refills; today please advise

## 2016-12-25 ENCOUNTER — Telehealth: Payer: Self-pay

## 2016-12-25 DIAGNOSIS — M810 Age-related osteoporosis without current pathological fracture: Secondary | ICD-10-CM

## 2016-12-25 NOTE — Telephone Encounter (Signed)
Call to patient regarding scheduling of Reclast. Patient had surgery on 12/11/2016 and RN wants to ensure she feels comfortable moving forward at this time with Reclast. Patient's insurance will not require prior-approval, as of today through 01/17/17. However, as of 9/0/12 new policies will be in placed, and these services will require prior approval at that time. Patient would like to proceed. Lab appointment for CMP check scheduled for 12/26/2016 at 10:30 am. Patient is agreeable to date and time. Aware once results return as long as everything is normal will proceed with scheduling Reclast. Patient is asking for appointment on 3/16 in the morning, 3/19, or 3/20.

## 2016-12-26 ENCOUNTER — Other Ambulatory Visit: Payer: BC Managed Care – PPO

## 2016-12-26 DIAGNOSIS — M810 Age-related osteoporosis without current pathological fracture: Secondary | ICD-10-CM

## 2016-12-26 LAB — COMPREHENSIVE METABOLIC PANEL
ALT: 24 U/L (ref 6–29)
AST: 23 U/L (ref 10–35)
Albumin: 4.6 g/dL (ref 3.6–5.1)
Alkaline Phosphatase: 53 U/L (ref 33–130)
BUN: 15 mg/dL (ref 7–25)
CO2: 26 mmol/L (ref 20–31)
Calcium: 9.3 mg/dL (ref 8.6–10.4)
Chloride: 105 mmol/L (ref 98–110)
Creat: 0.89 mg/dL (ref 0.50–0.99)
Glucose, Bld: 98 mg/dL (ref 65–99)
Potassium: 4.4 mmol/L (ref 3.5–5.3)
Sodium: 141 mmol/L (ref 135–146)
Total Bilirubin: 0.5 mg/dL (ref 0.2–1.2)
Total Protein: 6.5 g/dL (ref 6.1–8.1)

## 2017-01-05 NOTE — Telephone Encounter (Signed)
Spoke with patient. Advised I have scheduled you for your outpatient infusion of Reclast for 01/13/2017 at 3:30 pm with 3:15 pm arrival at the [facility]. Their address is [address] and phone number is [phone]. You can use the valet parking for your convenience.    INSTRUCTIONS PRE-INFUSION for Reclast:   1. You may eat normally before receiving your infusion.   2. Drink two (2) glasses of fluids, such as water, within a few hours before receiving Reclast to help prevent kidney problems.   3. On the day of your infusion, DO NOT take any multivitamins.   4. On the day of your infusion, DO NOT take any other medications that are used to treat osteoporosis (like Fosamax, Boniva, Evista, Actonel).   5. Limit intake of NSAIDS (like Motrin) before and after the infusion.   6. You need to take an oral calcium supplement of 1200-1500 mg daily AND a Vitamin D supplement of at least 400IU daily.   Possible Side Effect: Patients made aware of the most commonly associated side effects of therapy. Patients may experience one or more side effects that could include: fever, flu-like symptoms, myalgia, arthralgia, and headache. Most of these side effects occur within the first 3 days following the dose of Reclast. They usually resolve within 3 days of onset but may last for up to 7 to 14 days. Patients should consult their physician if they have questions or if these symptoms persist. The incidence of these symptoms decreased markedly with subsequent doses of Reclast. Administration of acetaminophen following Reclast administration may reduce the incidence of these symptoms.   Patient is agreeable and verbalizes instructions.  Routing to provider for final review. Patient agreeable to disposition. Will close encounter.

## 2017-01-05 NOTE — Telephone Encounter (Signed)
Signed order faxed to [facility]. Spoke with [place]. Appointment for Reclast infusion scheduled for first available 01/13/2017 at 3:30 pm.at the [facility]. Their address is [address] and phone number is [phone].   Left message to call [place] at [phone].

## 2017-01-13 ENCOUNTER — Ambulatory Visit (HOSPITAL_COMMUNITY)
Admission: RE | Admit: 2017-01-13 | Discharge: 2017-01-13 | Disposition: A | Discharge status: Home / Self Care | Payer: BC Managed Care – PPO | Source: Ambulatory Visit | Attending: Obstetrics & Gynecology | Admitting: Obstetrics & Gynecology

## 2017-01-13 ENCOUNTER — Encounter (HOSPITAL_COMMUNITY): Payer: Self-pay

## 2017-01-13 DIAGNOSIS — M810 Age-related osteoporosis without current pathological fracture: Secondary | ICD-10-CM | POA: Diagnosis present

## 2017-01-13 MED ORDER — ZOLEDRONIC ACID 5 MG/100ML IV SOLN
5.0000 mg | Freq: Once | INTRAVENOUS | Status: AC
  Administered 2017-01-13: 5 mg via INTRAVENOUS
  Filled 2017-01-13: qty 100

## 2017-01-13 MED ORDER — SODIUM CHLORIDE 0.9 % IV SOLN
Freq: Once | INTRAVENOUS | Status: AC
  Administered 2017-01-13: 15:00:00 via INTRAVENOUS

## 2017-01-13 NOTE — Discharge Instructions (Signed)
Drink  Fluids/water as tolerated over the next 72 hours Tylenol or ibuprofen if needed for aches and pains  Continue Calcium and Vit D as directed by your MD - call and see what dosage    Reclast    Zoledronic Acid injection (Paget's Disease, Osteoporosis) What is this medicine? ZOLEDRONIC ACID (ZOE le dron ik AS id) lowers the amount of calcium loss from bone. It is used to treat Paget's disease and osteoporosis in women. This medicine may be used for other purposes; ask your health care provider or pharmacist if you have questions. COMMON BRAND NAME(S): Reclast, Zometa What should I tell my health care provider before I take this medicine? They need to know if you have any of these conditions: -aspirin-sensitive asthma -cancer, especially if you are receiving medicines used to treat cancer -dental disease or wear dentures -infection -kidney disease -low levels of calcium in the blood -past surgery on the parathyroid gland or intestines -receiving corticosteroids like dexamethasone or prednisone -an unusual or allergic reaction to zoledronic acid, other medicines, foods, dyes, or preservatives -pregnant or trying to get pregnant -breast-feeding How should I use this medicine? This medicine is for infusion into a vein. It is given by a health care professional in a hospital or clinic setting. Talk to your pediatrician regarding the use of this medicine in children. This medicine is not approved for use in children. Overdosage: If you think you have taken too much of this medicine contact a poison control center or emergency room at once. NOTE: This medicine is only for you. Do not share this medicine with others. What if I miss a dose? It is important not to miss your dose. Call your doctor or health care professional if you are unable to keep an appointment. What may interact with this medicine? -certain antibiotics given by injection -NSAIDs, medicines for pain and  inflammation, like ibuprofen or naproxen -some diuretics like bumetanide, furosemide -teriparatide This list may not describe all possible interactions. Give your health care provider a list of all the medicines, herbs, non-prescription drugs, or dietary supplements you use. Also tell them if you smoke, drink alcohol, or use illegal drugs. Some items may interact with your medicine. What should I watch for while using this medicine? Visit your doctor or health care professional for regular checkups. It may be some time before you see the benefit from this medicine. Do not stop taking your medicine unless your doctor tells you to. Your doctor may order blood tests or other tests to see how you are doing. Women should inform their doctor if they wish to become pregnant or think they might be pregnant. There is a potential for serious side effects to an unborn child. Talk to your health care professional or pharmacist for more information. You should make sure that you get enough calcium and vitamin D while you are taking this medicine. Discuss the foods you eat and the vitamins you take with your health care professional. Some people who take this medicine have severe bone, joint, and/or muscle pain. This medicine may also increase your risk for jaw problems or a broken thigh bone. Tell your doctor right away if you have severe pain in your jaw, bones, joints, or muscles. Tell your doctor if you have any pain that does not go away or that gets worse. Tell your dentist and dental surgeon that you are taking this medicine. You should not have major dental surgery while on this medicine. See your  dentist to have a dental exam and fix any dental problems before starting this medicine. Take good care of your teeth while on this medicine. Make sure you see your dentist for regular follow-up appointments. What side effects may I notice from receiving this medicine? Side effects that you should report to your  doctor or health care professional as soon as possible: -allergic reactions like skin rash, itching or hives, swelling of the face, lips, or tongue -anxiety, confusion, or depression -breathing problems -changes in vision -eye pain -feeling faint or lightheaded, falls -jaw pain, especially after dental work -mouth sores -muscle cramps, stiffness, or weakness -redness, blistering, peeling or loosening of the skin, including inside the mouth -trouble passing urine or change in the amount of urine Side effects that usually do not require medical attention (report to your doctor or health care professional if they continue or are bothersome): -bone, joint, or muscle pain -constipation -diarrhea -fever -hair loss -irritation at site where injected -loss of appetite -nausea, vomiting -stomach upset -trouble sleeping -trouble swallowing -weak or tired This list may not describe all possible side effects. Call your doctor for medical advice about side effects. You may report side effects to FDA at 1-800-FDA-1088. Where should I keep my medicine? This drug is given in a hospital or clinic and will not be stored at home. NOTE: This sheet is a summary. It may not cover all possible information. If you have questions about this medicine, talk to your doctor, pharmacist, or health care provider.  2018 Elsevier/Gold Standard (2014-03-04 14:19:57)

## 2017-01-15 ENCOUNTER — Telehealth: Payer: Self-pay | Admitting: Obstetrics & Gynecology

## 2017-01-15 DIAGNOSIS — Z1211 Encounter for screening for malignant neoplasm of colon: Secondary | ICD-10-CM

## 2017-01-15 NOTE — Telephone Encounter (Signed)
Tyler from [employer] called to make sure we received an abnormal ColoGuard result for this patient that was faxed yesterday.

## 2017-01-15 NOTE — Telephone Encounter (Signed)
Left message for patient to call [place] at [phone]. Need to advise Cologuard testing returned positive. She will need a colonoscopy. Will need referral to GI. Who would she like to see? Paper result on my desk.  Returned call to [employer]. Spoke with Bruce. Advised we have received Cologuard testing results.

## 2017-01-19 ENCOUNTER — Other Ambulatory Visit: Payer: Self-pay | Admitting: Obstetrics & Gynecology

## 2017-01-19 NOTE — Telephone Encounter (Signed)
Patient returning your call.

## 2017-01-19 NOTE — Telephone Encounter (Signed)
Patient returned call

## 2017-01-19 NOTE — Telephone Encounter (Signed)
Spoke with patient. Advised patient of positive Cologuard testing and that she will need to be seen with GI fro a colonoscopy. Patient is agreeable. Doe snot have anyone she prefers for GI care. Referral placed to Dr.Mann. Patient is aware our referrals coordinator will work to get this schedule and she will be contacted with an appointment date and time. Patient is agreeable.  Cc; Theresia Lo for appointment scheduling  Routing to provider for final review. Patient agreeable to disposition. Will close encounter.

## 2017-01-20 ENCOUNTER — Other Ambulatory Visit: Payer: Self-pay | Admitting: Obstetrics & Gynecology

## 2017-01-20 DIAGNOSIS — Z1211 Encounter for screening for malignant neoplasm of colon: Secondary | ICD-10-CM

## 2017-01-20 LAB — COLOGUARD: Cologuard: POSITIVE

## 2017-01-21 ENCOUNTER — Telehealth: Payer: Self-pay | Admitting: Obstetrics & Gynecology

## 2017-01-21 NOTE — Telephone Encounter (Signed)
Patient is waiting to hear back regarding her GI referral. I informed patient that Jacqlyn Larsen in referrals is still working on the referral. Patient is asking for a call back with an update. Patient said you may leave details on her voicemail.

## 2017-01-21 NOTE — Telephone Encounter (Signed)
Called patient with status update. Left details on voicemail.

## 2017-02-12 ENCOUNTER — Encounter: Payer: Self-pay | Admitting: Obstetrics & Gynecology

## 2017-02-27 ENCOUNTER — Ambulatory Visit: Payer: BC Managed Care – PPO | Admitting: Obstetrics & Gynecology

## 2017-03-02 ENCOUNTER — Other Ambulatory Visit: Payer: Self-pay | Admitting: Obstetrics & Gynecology

## 2017-03-03 ENCOUNTER — Other Ambulatory Visit: Payer: Self-pay | Admitting: General Surgery

## 2017-03-03 DIAGNOSIS — D122 Benign neoplasm of ascending colon: Secondary | ICD-10-CM

## 2017-03-03 NOTE — Telephone Encounter (Signed)
Medication refill request: Wellbutrin  Last AEX:  11-20-16  Next AEX: 02-26-18  Last MMG (if hormonal medication request): 10-28-16 abnormal- BX - Benign papilloma Refill authorized: please advise

## 2017-03-05 ENCOUNTER — Encounter: Payer: Self-pay | Admitting: Obstetrics & Gynecology

## 2017-03-08 ENCOUNTER — Ambulatory Visit: Payer: Self-pay | Admitting: General Surgery

## 2017-03-09 ENCOUNTER — Ambulatory Visit
Admission: RE | Admit: 2017-03-09 | Discharge: 2017-03-09 | Disposition: A | Discharge status: Home / Self Care | Payer: BC Managed Care – PPO | Source: Ambulatory Visit | Attending: General Surgery | Admitting: General Surgery

## 2017-03-09 DIAGNOSIS — D122 Benign neoplasm of ascending colon: Secondary | ICD-10-CM

## 2017-03-09 MED ORDER — IOPAMIDOL (ISOVUE-300) INJECTION 61%
100.0000 mL | Freq: Once | INTRAVENOUS | Status: AC | PRN
  Administered 2017-03-09: 100 mL via INTRAVENOUS

## 2017-03-10 NOTE — Pre-Procedure Instructions (Signed)
  SHELISE MARON  03/10/2017      [facility] - [place], [place] - [place] [address] [place] [address] Phone: [phone] Fax: [phone]  [facility] [address] [place], [place] - [place] [address] Phone: [phone] Fax: [phone]  [facility] [address] [place], [place] [address] Phone: [phone] Fax: [phone]    Your procedure is scheduled on  Friday, June 1st   Report to [facility] Admitting at 7:00 AM             (posted surgery time 9:00 - 11:30 AM)   Call this number if you have problems the morning of surgery:  [phone], other questions call, [phone] Mon-Fri from 8-4:30pm   Remember:  Do not eat food or drink liquids after midnight Thursday.              4-5 days prior to surgery, STOP TAKING any Vitamins, Herbal Supplements, Anti-inflammatories.   Take these medicines the morning of surgery with A SIP OF WATER : Cymbalta, Pepcid, Tramadol   Do not wear jewelry, make-up or nail polish.  Do not wear lotions, powders, perfumes, or deoderant.  Do not shave 48 hours prior to surgery.     Do not bring valuables to the hospital.  [facility] is not responsible for any belongings or valuables.  Contacts, dentures or bridgework may not be worn into surgery.  Leave your suitcase in the car.  After surgery it may be brought to your room.  For patients admitted to the hospital, discharge time will be determined by your treatment team.   Please read over the following fact sheets that you were given. Pain Booklet and Surgical Site Infection Prevention

## 2017-03-11 ENCOUNTER — Encounter (HOSPITAL_COMMUNITY): Payer: Self-pay

## 2017-03-11 ENCOUNTER — Encounter (HOSPITAL_COMMUNITY)
Admission: RE | Admit: 2017-03-11 | Discharge: 2017-03-11 | Disposition: A | Discharge status: Home / Self Care | Payer: BC Managed Care – PPO | Source: Ambulatory Visit | Attending: General Surgery | Admitting: General Surgery

## 2017-03-11 DIAGNOSIS — Z01812 Encounter for preprocedural laboratory examination: Secondary | ICD-10-CM | POA: Diagnosis present

## 2017-03-11 DIAGNOSIS — K429 Umbilical hernia without obstruction or gangrene: Secondary | ICD-10-CM | POA: Insufficient documentation

## 2017-03-11 HISTORY — DX: Gastro-esophageal reflux disease without esophagitis: K21.9

## 2017-03-11 LAB — BASIC METABOLIC PANEL
Anion gap: 8 (ref 5–15)
BUN: 18 mg/dL (ref 6–20)
CO2: 25 mmol/L (ref 22–32)
Calcium: 9.5 mg/dL (ref 8.9–10.3)
Chloride: 105 mmol/L (ref 101–111)
Creatinine, Ser: 0.7 mg/dL (ref 0.44–1.00)
GFR calc Af Amer: >60 mL/min (ref >60)
GFR calc non Af Amer: >60 mL/min (ref >60)
Glucose, Bld: 96 mg/dL (ref 65–99)
Potassium: 4.4 mmol/L (ref 3.5–5.1)
Sodium: 138 mmol/L (ref 135–145)

## 2017-03-11 LAB — CBC
HCT: 41.9 % (ref 36.0–46.0)
Hemoglobin: 13.6 g/dL (ref 12.0–15.0)
MCH: 30.1 pg (ref 26.0–34.0)
MCHC: 32.5 g/dL (ref 30.0–36.0)
MCV: 92.7 fL (ref 78.0–100.0)
Platelets: 175 10*3/uL (ref 150–400)
RBC: 4.52 MIL/uL (ref 3.87–5.11)
RDW: 13.3 % (ref 11.5–15.5)
WBC: 5.3 10*3/uL (ref 4.0–10.5)

## 2017-03-11 NOTE — Pre-Procedure Instructions (Signed)
  ROCIO ROAM  03/11/2017      [facility] - [place], [place] - [place] [address] [place] [address] Phone: [phone] Fax: [phone]  [facility] [address] [place], [place] - [place] [address] Phone: [phone] Fax: [phone]  [facility] [address] [place], [place] [address] Phone: [phone] Fax: [phone]    Your procedure is scheduled on  Friday, June 1st   Report to [facility] Admitting at 7:00 AM             (posted surgery time 9:00 - 11:30 AM)   Call this number if you have problems the morning of surgery:  [phone], other questions call, [phone] Mon-Fri from 8-4:30pm   Remember:  Do not eat food or drink liquids after midnight Thursday.  EXCEPTION- consume Boost drink at 5am on day of surgery              4-5 days prior to surgery, STOP TAKING any Vitamins, Herbal Supplements, Anti-inflammatories.   Take these medicines the morning of surgery with A SIP OF WATER : Cymbalta, Pepcid, Tramadol if needed, also take Wellbutrin   Do not wear jewelry, make-up or nail polish.  Do not wear lotions, powders, perfumes, or deoderant.  Do not shave 48 hours prior to surgery.     Do not bring valuables to the hospital.  [facility] is not responsible for any belongings or valuables.  Contacts, dentures or bridgework may not be worn into surgery.  Leave your suitcase in the car.  After surgery it may be brought to your room.  For patients admitted to the hospital, discharge time will be determined by your treatment team.   Please read over the following fact sheets that you were given. Pain Booklet and Surgical Site Infection Prevention

## 2017-03-19 MED ORDER — ALVIMOPAN 12 MG PO CAPS
12.0000 mg | ORAL_CAPSULE | ORAL | Status: AC
  Administered 2017-03-20: 12 mg via ORAL
  Filled 2017-03-19: qty 1

## 2017-03-19 MED ORDER — CEFOTETAN DISODIUM-DEXTROSE 2-2.08 GM-% IV SOLR
2.0000 g | INTRAVENOUS | Status: AC
  Administered 2017-03-20: 2 g via INTRAVENOUS
  Filled 2017-03-19: qty 50

## 2017-03-19 MED ORDER — CELECOXIB 200 MG PO CAPS
400.0000 mg | ORAL_CAPSULE | ORAL | Status: AC
  Administered 2017-03-20: 400 mg via ORAL
  Filled 2017-03-19: qty 2

## 2017-03-19 MED ORDER — ACETAMINOPHEN 500 MG PO TABS
1000.0000 mg | ORAL_TABLET | ORAL | Status: AC
  Administered 2017-03-20: 1000 mg via ORAL
  Filled 2017-03-19: qty 2

## 2017-03-19 MED ORDER — GABAPENTIN 300 MG PO CAPS
300.0000 mg | ORAL_CAPSULE | ORAL | Status: AC
  Administered 2017-03-20: 300 mg via ORAL
  Filled 2017-03-19: qty 1

## 2017-03-20 ENCOUNTER — Inpatient Hospital Stay (HOSPITAL_COMMUNITY): Payer: BC Managed Care – PPO | Admitting: Anesthesiology

## 2017-03-20 ENCOUNTER — Encounter (HOSPITAL_COMMUNITY)
Admission: RE | Disposition: A | Discharge status: Home / Self Care | Payer: Self-pay | Source: Ambulatory Visit | Attending: General Surgery

## 2017-03-20 ENCOUNTER — Inpatient Hospital Stay (HOSPITAL_COMMUNITY)
Admission: RE | Admit: 2017-03-20 | Discharge: 2017-03-23 | DRG: 331 | Disposition: A | Discharge status: Home / Self Care | Payer: BC Managed Care – PPO | Source: Ambulatory Visit | Attending: General Surgery | Admitting: General Surgery

## 2017-03-20 ENCOUNTER — Encounter (HOSPITAL_COMMUNITY): Payer: Self-pay | Admitting: *Deleted

## 2017-03-20 DIAGNOSIS — D120 Benign neoplasm of cecum: Secondary | ICD-10-CM | POA: Diagnosis present

## 2017-03-20 DIAGNOSIS — Z87891 Personal history of nicotine dependence: Secondary | ICD-10-CM | POA: Diagnosis not present

## 2017-03-20 DIAGNOSIS — K219 Gastro-esophageal reflux disease without esophagitis: Secondary | ICD-10-CM | POA: Diagnosis present

## 2017-03-20 DIAGNOSIS — M1990 Unspecified osteoarthritis, unspecified site: Secondary | ICD-10-CM | POA: Diagnosis present

## 2017-03-20 DIAGNOSIS — R413 Other amnesia: Secondary | ICD-10-CM | POA: Diagnosis present

## 2017-03-20 DIAGNOSIS — Z973 Presence of spectacles and contact lenses: Secondary | ICD-10-CM | POA: Diagnosis not present

## 2017-03-20 DIAGNOSIS — M791 Myalgia: Secondary | ICD-10-CM | POA: Diagnosis present

## 2017-03-20 DIAGNOSIS — R3915 Urgency of urination: Secondary | ICD-10-CM | POA: Diagnosis present

## 2017-03-20 DIAGNOSIS — K429 Umbilical hernia without obstruction or gangrene: Secondary | ICD-10-CM | POA: Diagnosis present

## 2017-03-20 DIAGNOSIS — M549 Dorsalgia, unspecified: Secondary | ICD-10-CM | POA: Diagnosis present

## 2017-03-20 DIAGNOSIS — Z79899 Other long term (current) drug therapy: Secondary | ICD-10-CM | POA: Diagnosis not present

## 2017-03-20 DIAGNOSIS — K649 Unspecified hemorrhoids: Secondary | ICD-10-CM | POA: Diagnosis present

## 2017-03-20 DIAGNOSIS — N951 Menopausal and female climacteric states: Secondary | ICD-10-CM | POA: Diagnosis present

## 2017-03-20 DIAGNOSIS — F419 Anxiety disorder, unspecified: Secondary | ICD-10-CM | POA: Diagnosis present

## 2017-03-20 DIAGNOSIS — D374 Neoplasm of uncertain behavior of colon: Principal | ICD-10-CM | POA: Diagnosis present

## 2017-03-20 DIAGNOSIS — M2550 Pain in unspecified joint: Secondary | ICD-10-CM | POA: Diagnosis present

## 2017-03-20 DIAGNOSIS — M2560 Stiffness of unspecified joint, not elsewhere classified: Secondary | ICD-10-CM | POA: Diagnosis present

## 2017-03-20 DIAGNOSIS — J302 Other seasonal allergic rhinitis: Secondary | ICD-10-CM | POA: Diagnosis present

## 2017-03-20 DIAGNOSIS — K439 Ventral hernia without obstruction or gangrene: Secondary | ICD-10-CM | POA: Diagnosis present

## 2017-03-20 DIAGNOSIS — D241 Benign neoplasm of right breast: Secondary | ICD-10-CM | POA: Diagnosis present

## 2017-03-20 DIAGNOSIS — F329 Major depressive disorder, single episode, unspecified: Secondary | ICD-10-CM | POA: Diagnosis present

## 2017-03-20 DIAGNOSIS — M7989 Other specified soft tissue disorders: Secondary | ICD-10-CM | POA: Diagnosis present

## 2017-03-20 DIAGNOSIS — R5383 Other fatigue: Secondary | ICD-10-CM | POA: Diagnosis present

## 2017-03-20 DIAGNOSIS — Z882 Allergy status to sulfonamides status: Secondary | ICD-10-CM | POA: Diagnosis not present

## 2017-03-20 HISTORY — PX: LAPAROSCOPIC RIGHT COLECTOMY: SHX5925

## 2017-03-20 SURGERY — COLECTOMY, RIGHT, LAPAROSCOPIC
Anesthesia: General | Site: Abdomen | Laterality: Right

## 2017-03-20 MED ORDER — BUPIVACAINE-EPINEPHRINE 0.25% -1:200000 IJ SOLN
INTRAMUSCULAR | Status: DC | PRN
  Administered 2017-03-20: 15 mL

## 2017-03-20 MED ORDER — BUPROPION HCL ER (XL) 150 MG PO TB24
150.0000 mg | ORAL_TABLET | Freq: Every day | ORAL | Status: DC
  Administered 2017-03-21 – 2017-03-23 (×3): 150 mg via ORAL
  Filled 2017-03-20 (×3): qty 1

## 2017-03-20 MED ORDER — SCOPOLAMINE 1 MG/3DAYS TD PT72
1.0000 | MEDICATED_PATCH | TRANSDERMAL | Status: DC
  Administered 2017-03-20: 1.5 mg via TRANSDERMAL

## 2017-03-20 MED ORDER — HYDROMORPHONE HCL 1 MG/ML IJ SOLN
0.2500 mg | INTRAMUSCULAR | Status: DC | PRN
  Administered 2017-03-20 (×3): 0.5 mg via INTRAVENOUS

## 2017-03-20 MED ORDER — OXYCODONE HCL 5 MG PO TABS: 5.0000 mg | ORAL_TABLET | Freq: Once | ORAL | Status: DC | PRN

## 2017-03-20 MED ORDER — FENTANYL CITRATE (PF) 250 MCG/5ML IJ SOLN
INTRAMUSCULAR | Status: AC
  Filled 2017-03-20: qty 5

## 2017-03-20 MED ORDER — FENTANYL CITRATE (PF) 100 MCG/2ML IJ SOLN
25.0000 ug | INTRAMUSCULAR | Status: DC | PRN
  Administered 2017-03-20 – 2017-03-21 (×6): 50 ug via INTRAVENOUS
  Filled 2017-03-20 (×6): qty 2

## 2017-03-20 MED ORDER — HEPARIN SODIUM (PORCINE) 5000 UNIT/ML IJ SOLN
5000.0000 [IU] | Freq: Three times a day (TID) | INTRAMUSCULAR | Status: DC
  Administered 2017-03-21 – 2017-03-23 (×7): 5000 [IU] via SUBCUTANEOUS
  Filled 2017-03-20 (×7): qty 1

## 2017-03-20 MED ORDER — ONDANSETRON 4 MG PO TBDP: 4.0000 mg | ORAL_TABLET | Freq: Four times a day (QID) | ORAL | Status: DC | PRN

## 2017-03-20 MED ORDER — MIDAZOLAM HCL 2 MG/2ML IJ SOLN
INTRAMUSCULAR | Status: DC | PRN
  Administered 2017-03-20: 2 mg via INTRAVENOUS

## 2017-03-20 MED ORDER — SCOPOLAMINE 1 MG/3DAYS TD PT72
MEDICATED_PATCH | TRANSDERMAL | Status: AC
  Filled 2017-03-20: qty 1

## 2017-03-20 MED ORDER — PANTOPRAZOLE SODIUM 40 MG IV SOLR
40.0000 mg | Freq: Every day | INTRAVENOUS | Status: DC
  Administered 2017-03-20 – 2017-03-21 (×2): 40 mg via INTRAVENOUS
  Filled 2017-03-20 (×2): qty 40

## 2017-03-20 MED ORDER — MIDAZOLAM HCL 2 MG/2ML IJ SOLN
INTRAMUSCULAR | Status: AC
  Filled 2017-03-20: qty 2

## 2017-03-20 MED ORDER — BUPIVACAINE-EPINEPHRINE (PF) 0.25% -1:200000 IJ SOLN
INTRAMUSCULAR | Status: AC
  Filled 2017-03-20: qty 30

## 2017-03-20 MED ORDER — 0.9 % SODIUM CHLORIDE (POUR BTL) OPTIME
TOPICAL | Status: DC | PRN
  Administered 2017-03-20 (×2): 1000 mL

## 2017-03-20 MED ORDER — SUGAMMADEX SODIUM 200 MG/2ML IV SOLN
INTRAVENOUS | Status: DC | PRN
  Administered 2017-03-20: 100 mg via INTRAVENOUS

## 2017-03-20 MED ORDER — DULOXETINE HCL 60 MG PO CPEP
60.0000 mg | ORAL_CAPSULE | Freq: Every day | ORAL | Status: DC
  Administered 2017-03-21 – 2017-03-23 (×3): 60 mg via ORAL
  Filled 2017-03-20 (×3): qty 1

## 2017-03-20 MED ORDER — LIDOCAINE HCL (CARDIAC) 20 MG/ML IV SOLN
INTRAVENOUS | Status: DC | PRN
  Administered 2017-03-20: 60 mg via INTRAVENOUS

## 2017-03-20 MED ORDER — PROPOFOL 10 MG/ML IV BOLUS
INTRAVENOUS | Status: AC
  Filled 2017-03-20: qty 20

## 2017-03-20 MED ORDER — ROCURONIUM BROMIDE 100 MG/10ML IV SOLN
INTRAVENOUS | Status: DC | PRN
  Administered 2017-03-20: 20 mg via INTRAVENOUS
  Administered 2017-03-20: 50 mg via INTRAVENOUS
  Administered 2017-03-20: 10 mg via INTRAVENOUS

## 2017-03-20 MED ORDER — PROPOFOL 10 MG/ML IV BOLUS
INTRAVENOUS | Status: DC | PRN
  Administered 2017-03-20: 120 mg via INTRAVENOUS

## 2017-03-20 MED ORDER — FENTANYL CITRATE (PF) 100 MCG/2ML IJ SOLN
INTRAMUSCULAR | Status: DC | PRN
Start: 2017-03-20 — End: 2017-03-20
  Administered 2017-03-20: 100 ug via INTRAVENOUS
  Administered 2017-03-20 (×3): 50 ug via INTRAVENOUS

## 2017-03-20 MED ORDER — ONDANSETRON HCL 4 MG/2ML IJ SOLN
INTRAMUSCULAR | Status: DC | PRN
  Administered 2017-03-20 (×2): 4 mg via INTRAVENOUS

## 2017-03-20 MED ORDER — LACTATED RINGERS IV SOLN
INTRAVENOUS | Status: DC
  Administered 2017-03-20 (×2): via INTRAVENOUS

## 2017-03-20 MED ORDER — PROMETHAZINE HCL 25 MG/ML IJ SOLN: 6.2500 mg | INTRAMUSCULAR | Status: DC | PRN

## 2017-03-20 MED ORDER — DEXAMETHASONE SODIUM PHOSPHATE 10 MG/ML IJ SOLN
INTRAMUSCULAR | Status: DC | PRN
  Administered 2017-03-20: 5 mg via INTRAVENOUS

## 2017-03-20 MED ORDER — METHOCARBAMOL 500 MG PO TABS: 500.0000 mg | ORAL_TABLET | Freq: Four times a day (QID) | ORAL | Status: DC | PRN

## 2017-03-20 MED ORDER — ONDANSETRON HCL 4 MG/2ML IJ SOLN
4.0000 mg | Freq: Four times a day (QID) | INTRAMUSCULAR | Status: DC | PRN
Start: 2017-03-20 — End: 2017-03-23

## 2017-03-20 MED ORDER — SODIUM CHLORIDE 0.9 % IR SOLN
Status: DC | PRN
  Administered 2017-03-20: 1000 mL

## 2017-03-20 MED ORDER — PHENYLEPHRINE HCL 10 MG/ML IJ SOLN
INTRAVENOUS | Status: DC | PRN
  Administered 2017-03-20: 10 ug/min via INTRAVENOUS

## 2017-03-20 MED ORDER — KCL IN DEXTROSE-NACL 20-5-0.9 MEQ/L-%-% IV SOLN
INTRAVENOUS | Status: DC
  Administered 2017-03-20: 15:00:00 via INTRAVENOUS
  Administered 2017-03-21: 1 mL via INTRAVENOUS
  Administered 2017-03-21 – 2017-03-23 (×3): via INTRAVENOUS
  Filled 2017-03-20 (×7): qty 1000

## 2017-03-20 MED ORDER — HYDROMORPHONE HCL 1 MG/ML IJ SOLN
INTRAMUSCULAR | Status: AC
  Administered 2017-03-20: 0.5 mg via INTRAVENOUS
  Filled 2017-03-20: qty 1

## 2017-03-20 MED ORDER — PHENYLEPHRINE HCL 10 MG/ML IJ SOLN
INTRAMUSCULAR | Status: DC | PRN
  Administered 2017-03-20 (×3): 80 ug via INTRAVENOUS
  Administered 2017-03-20: 40 ug via INTRAVENOUS

## 2017-03-20 MED ORDER — CHLORHEXIDINE GLUCONATE CLOTH 2 % EX PADS: 6.0000 | MEDICATED_PAD | Freq: Once | CUTANEOUS | Status: DC

## 2017-03-20 MED ORDER — OXYCODONE HCL 5 MG/5ML PO SOLN: 5.0000 mg | Freq: Once | ORAL | Status: DC | PRN

## 2017-03-20 MED ORDER — HYDROMORPHONE HCL 1 MG/ML IJ SOLN
INTRAMUSCULAR | Status: AC
  Filled 2017-03-20: qty 1

## 2017-03-20 MED ORDER — MEPERIDINE HCL 25 MG/ML IJ SOLN: 6.2500 mg | INTRAMUSCULAR | Status: DC | PRN

## 2017-03-20 SURGICAL SUPPLY — 61 items
APPLIER CLIP ROT 10 11.4 M/L (STAPLE)
BLADE CLIPPER SURG (BLADE) IMPLANT
CANISTER SUCT 3000ML PPV (MISCELLANEOUS) ×2 IMPLANT
CELLS DAT CNTRL 66122 CELL SVR (MISCELLANEOUS) ×1 IMPLANT
CHLORAPREP W/TINT 26ML (MISCELLANEOUS) ×2 IMPLANT
CLIP APPLIE ROT 10 11.4 M/L (STAPLE) IMPLANT
COVER MAYO STAND STRL (DRAPES) ×2 IMPLANT
COVER SURGICAL LIGHT HANDLE (MISCELLANEOUS) ×4 IMPLANT
DRAPE HALF SHEET 40X57 (DRAPES) ×2 IMPLANT
DRAPE LAPAROSCOPIC ABDOMINAL (DRAPES) IMPLANT
DRAPE UTILITY XL STRL (DRAPES) ×2 IMPLANT
DRAPE WARM FLUID 44X44 (DRAPE) ×2 IMPLANT
DRSG OPSITE POSTOP 4X10 (GAUZE/BANDAGES/DRESSINGS) IMPLANT
DRSG OPSITE POSTOP 4X6 (GAUZE/BANDAGES/DRESSINGS) ×2 IMPLANT
DRSG OPSITE POSTOP 4X8 (GAUZE/BANDAGES/DRESSINGS) IMPLANT
DRSG TEGADERM 2-3/8X2-3/4 SM (GAUZE/BANDAGES/DRESSINGS) ×6 IMPLANT
ELECT CAUTERY BLADE 6.4 (BLADE) ×4 IMPLANT
ELECT REM PT RETURN 9FT ADLT (ELECTROSURGICAL) ×2
ELECTRODE REM PT RTRN 9FT ADLT (ELECTROSURGICAL) ×1 IMPLANT
GAUZE SPONGE 2X2 8PLY STRL LF (GAUZE/BANDAGES/DRESSINGS) ×1 IMPLANT
GLOVE BIO SURGEON STRL SZ7.5 (GLOVE) ×4 IMPLANT
GOWN STRL REUS W/ TWL LRG LVL3 (GOWN DISPOSABLE) ×8 IMPLANT
GOWN STRL REUS W/TWL LRG LVL3 (GOWN DISPOSABLE) ×8
KIT BASIN OR (CUSTOM PROCEDURE TRAY) ×2 IMPLANT
KIT ROOM TURNOVER OR (KITS) ×2 IMPLANT
LIGASURE IMPACT 36 18CM CVD LR (INSTRUMENTS) ×2 IMPLANT
NS IRRIG 1000ML POUR BTL (IV SOLUTION) ×4 IMPLANT
PAD ARMBOARD 7.5X6 YLW CONV (MISCELLANEOUS) ×4 IMPLANT
PENCIL BUTTON HOLSTER BLD 10FT (ELECTRODE) ×4 IMPLANT
RELOAD PROXIMATE 75MM BLUE (ENDOMECHANICALS) ×4 IMPLANT
RTRCTR WOUND ALEXIS 18CM MED (MISCELLANEOUS) ×2
SCISSORS LAP 5X35 DISP (ENDOMECHANICALS) IMPLANT
SET IRRIG TUBING LAPAROSCOPIC (IRRIGATION / IRRIGATOR) ×2 IMPLANT
SHEARS HARMONIC ACE PLUS 36CM (ENDOMECHANICALS) ×2 IMPLANT
SLEEVE ENDOPATH XCEL 5M (ENDOMECHANICALS) ×2 IMPLANT
SPECIMEN JAR LARGE (MISCELLANEOUS) ×2 IMPLANT
SPONGE GAUZE 2X2 STER 10/PKG (GAUZE/BANDAGES/DRESSINGS) ×1
STAPLER GUN LINEAR PROX 60 (STAPLE) ×2 IMPLANT
STAPLER PROXIMATE 75MM BLUE (STAPLE) ×2 IMPLANT
STAPLER VISISTAT 35W (STAPLE) ×2 IMPLANT
SUT PDS AB 1 CT  36 (SUTURE) ×2
SUT PDS AB 1 CT 36 (SUTURE) ×2 IMPLANT
SUT SILK 2 0 (SUTURE) ×1
SUT SILK 2 0 SH CR/8 (SUTURE) ×4 IMPLANT
SUT SILK 2-0 18XBRD TIE 12 (SUTURE) ×1 IMPLANT
SUT SILK 3 0 (SUTURE) ×1
SUT SILK 3 0 SH CR/8 (SUTURE) ×2 IMPLANT
SUT SILK 3-0 18XBRD TIE 12 (SUTURE) ×1 IMPLANT
SYR BULB IRRIGATION 50ML (SYRINGE) ×4 IMPLANT
SYS LAPSCP GELPORT 120MM (MISCELLANEOUS)
SYSTEM LAPSCP GELPORT 120MM (MISCELLANEOUS) IMPLANT
TOWEL OR 17X26 10 PK STRL BLUE (TOWEL DISPOSABLE) ×4 IMPLANT
TRAY FOLEY W/METER SILVER 16FR (SET/KITS/TRAYS/PACK) ×2 IMPLANT
TRAY LAPAROSCOPIC MC (CUSTOM PROCEDURE TRAY) ×2 IMPLANT
TROCAR XCEL 12X100 BLDLESS (ENDOMECHANICALS) IMPLANT
TROCAR XCEL BLUNT TIP 100MML (ENDOMECHANICALS) IMPLANT
TROCAR XCEL NON-BLD 11X100MML (ENDOMECHANICALS) IMPLANT
TROCAR XCEL NON-BLD 5MMX100MML (ENDOMECHANICALS) ×2 IMPLANT
TUBE CONNECTING 12X1/4 (SUCTIONS) ×4 IMPLANT
TUBING INSUFFLATION (TUBING) ×2 IMPLANT
YANKAUER SUCT BULB TIP NO VENT (SUCTIONS) ×4 IMPLANT

## 2017-03-20 NOTE — Progress Notes (Signed)
Patient arrived to 6n05, very drowsy in and out of sleep. Patient IV fluids running, SCDs on. Abdomen noted to have 3 laps sites with one central midline with honeycomb, the others with gauze, all clean/intact. Patient stated she was comfortable. Will orient to room when more awake. Family in waiting room.

## 2017-03-20 NOTE — Interval H&P Note (Signed)
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 After consideration of risks, benefits and other options for treatment, the patient has consented to  Procedure(s): LAPAROSCOPIC ASSISTED RIGHT COLECTOMY (Right) as a surgical intervention .  The patient's history has been reviewed, patient examined, no change in status, stable for surgery.  I have reviewed the patient's chart and labs.  Questions were answered to the patient's satisfaction.     TOTH III,PAUL S

## 2017-03-20 NOTE — H&P (Signed)
Michelle Martinez  Location: [facility] Surgery Patient #: [id number] DOB: 01/23/1951 Widowed / Language: Cleophus Molt / Race: White Female   History of Present Illness The patient is a 66 year old female who presents with an abdominal mass. We are asked to see the patient in consultation by Dr. Collene Mares to evaluate her for a colon polyp. The patient is a 66 year old white female who is an old patient of mine. She apparently had a colonoscopy about 10 years ago at which time she was found to have a polyp that was not removed. She has been going through a lot over the last few years with the death of her husband. She recently took a cold regard test which came back positive. She then underwent a colonoscopy which found a large polypoid mass in the cecum. This was biopsied and it came back as an adenomatous polyp with high-grade dysplasia. She denied any abdominal pain. She denied any blood in the stool. She has been maintaining her weight. She has had no change in her bowel function.   Allergies  Sulfur *CHEMICALS*  Allergies Reconciled   Medication History  Caltrate 600 + D (600-125MG -IU Tablet, Oral) Active. Estrace (1MG  Tablet, Oral) Active. DULoxetine HCl (60MG  Capsule DR Part, Oral) Active. Arthrotec (75-0.2MG  Tablet DR, Oral) Active. Loratadine (10MG  Capsule, Oral) Active. Flonase (50MCG/ACT Suspension, Nasal) Active. Medications Reconciled    Review of Systems  General Present- Fatigue. Not Present- Appetite Loss, Chills, Fever, Night Sweats, Weight Gain and Weight Loss. Skin Not Present- Change in Wart/Mole, Dryness, Hives, Jaundice, New Lesions, Non-Healing Wounds, Rash and Ulcer. HEENT Present- Seasonal Allergies, Sinus Pain and Wears glasses/contact lenses. Not Present- Earache, Hearing Loss, Hoarseness, Nose Bleed, Oral Ulcers, Ringing in the Ears, Sore Throat, Visual Disturbances and Yellow Eyes. Breast Present- Breast Mass. Not Present- Breast Pain, Nipple  Discharge and Skin Changes. Cardiovascular Present- Palpitations. Not Present- Chest Pain, Difficulty Breathing Lying Down, Leg Cramps, Rapid Heart Rate, Shortness of Breath and Swelling of Extremities. Gastrointestinal Present- Hemorrhoids. Not Present- Abdominal Pain, Bloating, Bloody Stool, Change in Bowel Habits, Chronic diarrhea, Constipation, Difficulty Swallowing, Excessive gas, Gets full quickly at meals, Indigestion, Nausea, Rectal Pain and Vomiting. Female Genitourinary Present- Urgency. Not Present- Frequency, Nocturia, Painful Urination and Pelvic Pain. Musculoskeletal Present- Back Pain, Joint Pain, Joint Stiffness, Muscle Pain, Muscle Weakness and Swelling of Extremities. Neurological Present- Decreased Memory. Not Present- Fainting, Headaches, Numbness, Seizures, Tingling, Tremor, Trouble walking and Weakness. Psychiatric Present- Anxiety and Change in Sleep Pattern. Not Present- Bipolar, Depression, Fearful and Frequent crying. Endocrine Present- Hot flashes. Not Present- Cold Intolerance, Excessive Hunger, Hair Changes, Heat Intolerance and New Diabetes. Hematology Not Present- Blood Thinners, Easy Bruising, Excessive bleeding, Gland problems, HIV and Persistent Infections.  Vitals Weight: 123 lb Temp.: 97.55F  Pulse: 96 (Regular)  BP: 110/60 (Sitting, Left Arm, Standard)       Physical Exam  General Mental Status-Alert. General Appearance-Consistent with stated age. Hydration-Well hydrated. Voice-Normal.  Head and Neck Head-normocephalic, atraumatic with no lesions or palpable masses. Trachea-midline. Thyroid Gland Characteristics - normal size and consistency.  Eye Eyeball - Bilateral-Extraocular movements intact. Sclera/Conjunctiva - Bilateral-No scleral icterus.  Chest and Lung Exam Chest and lung exam reveals -quiet, even and easy respiratory effort with no use of accessory muscles and on auscultation, normal breath sounds, no  adventitious sounds and normal vocal resonance. Inspection Chest Wall - Normal. Back - normal.  Cardiovascular Cardiovascular examination reveals -normal heart sounds, regular rate and rhythm with no murmurs and normal pedal pulses  bilaterally.  Abdomen Note: The abdomen is soft and nontender. There is no palpable mass. There is no palpable groin or supraclavicular lymphadenopathy. There is a small fascial defect at the base of the umbilicus. There is a small fatty feeling bulge just above the umbilicus.   Neurologic Neurologic evaluation reveals -alert and oriented x 3 with no impairment of recent or remote memory. Mental Status-Normal.  Musculoskeletal Normal Exam - Left-Upper Extremity Strength Normal and Lower Extremity Strength Normal. Normal Exam - Right-Upper Extremity Strength Normal and Lower Extremity Strength Normal.  Lymphatic Head & Neck  General Head & Neck Lymphatics: Bilateral - Description - Normal. Axillary  General Axillary Region: Bilateral - Description - Normal. Tenderness - Non Tender. Femoral & Inguinal  Generalized Femoral & Inguinal Lymphatics: Bilateral - Description - Normal. Tenderness - Non Tender.    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 The adenoma could not be removed at time of colonoscopy. Because of the risk of cancer with this adenoma I would recommend a laparoscopic assisted right colectomy. I have discussed with her in detail the risks and benefits of the operation as well as some of the technical aspects and she understands and wishes to proceed. We will obtain a preoperative CT scan of the abdomen and pelvis for further planning and to rule out distant disease.

## 2017-03-20 NOTE — Op Note (Signed)
03/20/2017  11:28 AM  PATIENT:  Coolidge Breeze  66 y.o. 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 Zella Richer   ANESTHESIA:   local and general  EBL:  Total I/O In: 1000 [I.V.:1000] Out: 155 [Urine:105; Blood:50]  BLOOD ADMINISTERED:none  DRAINS: none   LOCAL MEDICATIONS USED:  MARCAINE     SPECIMEN:  Source of Specimen:  terminal ileum and right colon  DISPOSITION OF SPECIMEN:  PATHOLOGY  COUNTS:  YES  TOURNIQUET:  * No tourniquets in log *  DICTATION: .Dragon Dictation   After informed consent was obtained the patient was brought to the operating room and placed in the supine position on the operating room table. After adequate induction of general anesthesia the patient's abdomen was prepped with ChloraPrep, allowed to dry, and draped in usual sterile manner. An appropriate timeout was performed. A site was chosen in the left upper quadrant for access and the abdominal cavity. This area was infiltrated with quarter percent Marcaine. A small stab incision was made with a 15 blade knife. A 5 mm Optiview port and camera were used to bluntly dissect to the layers of the abdominal wall under direct vision until access was gained to the abdominal cavity. The abdomen was then insufflated with carbon dioxide without difficulty. The camera was placed through the port and the abdomen was inspected. There was a small bruise on the anterior wall of the stomach from accessing the abdominal cavity. The rest of the abdominal cavity was unremarkable. 2 other 5 mm ports were placed on the left side of the abdomen under  direct vision in a similar manner. These ports were used as working ports. The right colon was mobilized by incising its retroperitoneal attachments along the white line of Toldt. The transverse colon was also mobilized from its attachments. Care was taken to identify and avoid the duodenum. Once the terminal ileum and right colon had been freed I then made a small upper midline incision at the upper edge of the umbilicus with a 10 blade knife. This incision was carried through the skin and subcutaneous tissue sharply with electrocautery and the linea alba was identified. The linea alba was also incised with the electrocautery. The preperitoneal space was probed bluntly with a finger until access was gained to the abdominal cavity. The wrist incision was opened under direct vision. I was then able to identify the terminal ileum and right colon. This was all brought up into the wound. A site was chosen at the hepatic flexure for division of the colon. The mesentery at this point was opened sharply with the electrocautery. A GIA-75 stapler was placed across the colon at this point, clamped, and fired thereby dividing the bowel between staple lines. A similar side at the terminal ileum was also chosen. The mesentery at this point was opened sharply with electrocautery. A GIA-75 stapler was placed across the small bowel at this point, clamped, and fired thereby dividing the small bowel between staple lines. The mesentery to the right colon was then taken down sharply with the LigaSure. The main right colic artery was clamped with Kelly clamps, divided, and ligated with  2-0 silk ties. The terminal ileum and transverse colon approximated each other easily. A small opening was made on the antimesenteric edge of the transverse colon and small bowel with the electrocautery. Each limb of a GIA-75 stapler was then placed down the appropriate limb of small bowel or colon, clamped, and fired thereby creating nice widely patent  enteroenterostomy. The common opening was then closed with a TA 60 stapler. The staple line was imbricated with multiple 2-0 silk Lembert stitches. A 2-0 silk crotch stitch was also placed at the end of the anastomosis. The mesenteric defect was closed with multiple interrupted 2-0 silk figure-of-eight stitches. Once this was accomplished the anastomosis appeared healthy and widely patent with no tension. The anastomosis was placed back in the abdominal cavity. The abdomen was then irrigated with copious metastases of saline. In opening the midline incision a small epigastric hernia was also encountered and this was excised. There was also a small umbilical hernia and the fascia was opened through this as well. The fascia of the anterior abdominal wall was then closed with 2 running #1 PDS sutures. The subcutaneous tissue was irrigated with copious amounts of saline. The abdomen was then insufflated again and examined and no significant abnormalities were noted. The anastomosis looked good. The gas was then allowed to escape and the ports were removed. The incisions were then closed with staples and sterile dressings were applied. The patient tolerated the procedure well. At the end of the case all needle sponge and instrument counts were correct. The patient was then awakened and taken to recovery in stable condition. The assistant was essential to visualization and retraction during the case.  PLAN OF CARE: Admit to inpatient   PATIENT DISPOSITION:  PACU - hemodynamically stable.   Delay start of Pharmacological VTE agent (>24hrs) due to surgical blood loss or risk of bleeding: no

## 2017-03-20 NOTE — Anesthesia Procedure Notes (Signed)
Procedure Name: Intubation Date/Time: 03/20/2017 9:36 AM Performed by: Salli Quarry WEAVER Pre-anesthesia Checklist: Patient identified, Emergency Drugs available, Suction available and Patient being monitored Patient Re-evaluated:Patient Re-evaluated prior to inductionOxygen Delivery Method: Circle System Utilized Preoxygenation: Pre-oxygenation with 100% oxygen Intubation Type: IV induction Ventilation: Mask ventilation without difficulty Laryngoscope Size: Mac and 3 Grade View: Grade III Tube type: Oral Tube size: 7.0 mm Number of attempts: 1 Airway Equipment and Method: Stylet and Oral airway Placement Confirmation: ETT inserted through vocal cords under direct vision,  positive ETCO2 and breath sounds checked- equal and bilateral Secured at: 22 cm Tube secured with: Tape Dental Injury: Teeth and Oropharynx as per pre-operative assessment

## 2017-03-20 NOTE — Anesthesia Postprocedure Evaluation (Signed)
Anesthesia Post Note  Patient: Michelle Martinez  Procedure(s) Performed: Procedure(s) (LRB): LAPAROSCOPIC ASSISTED RIGHT COLECTOMY (Right)     Patient location during evaluation: PACU Anesthesia Type: General Level of consciousness: awake and alert Pain management: pain level controlled Vital Signs Assessment: post-procedure vital signs reviewed and stable Respiratory status: spontaneous breathing, nonlabored ventilation and respiratory function stable Cardiovascular status: blood pressure returned to baseline and stable Postop Assessment: no signs of nausea or vomiting Anesthetic complications: no    Last Vitals:  Vitals:   03/20/17 1215 03/20/17 1230  BP: 127/71 122/73  Pulse: 89 98  Resp: 14 13  Temp:      Last Pain:  Vitals:   03/20/17 1209  TempSrc:   PainSc: Asleep                 Lynda Rainwater

## 2017-03-20 NOTE — Anesthesia Preprocedure Evaluation (Signed)
Anesthesia Evaluation  Patient identified by MRN, date of birth, ID band Patient awake    Reviewed: Allergy & Precautions, NPO status , Patient's Chart, lab work & pertinent test results  History of Anesthesia Complications (+) PONV and history of anesthetic complications  Airway Mallampati: II  TM Distance: >3 FB Neck ROM: Full    Dental  (+) Teeth Intact   Pulmonary former smoker,    Pulmonary exam normal breath sounds clear to auscultation       Cardiovascular negative cardio ROS Normal cardiovascular exam+ dysrhythmias  Rhythm:Regular Rate:Normal     Neuro/Psych  Headaches, PSYCHIATRIC DISORDERS Anxiety Depression    GI/Hepatic negative GI ROS, Neg liver ROS, GERD  ,  Endo/Other  Right breast intraductal papilloma  Renal/GU negative Renal ROS  negative genitourinary   Musculoskeletal  (+) Arthritis ,   Abdominal   Peds  Hematology negative hematology ROS (+)   Anesthesia Other Findings   Reproductive/Obstetrics                             Anesthesia Physical  Anesthesia Plan  ASA: II  Anesthesia Plan: General   Post-op Pain Management:    Induction: Intravenous  Airway Management Planned: Oral ETT  Additional Equipment:   Intra-op Plan:   Post-operative Plan: Extubation in OR  Informed Consent: I have reviewed the patients History and Physical, chart, labs and discussed the procedure including the risks, benefits and alternatives for the proposed anesthesia with the patient or authorized representative who has indicated his/her understanding and acceptance.     Plan Discussed with: Anesthesiologist, CRNA and Surgeon  Anesthesia Plan Comments:         Anesthesia Quick Evaluation

## 2017-03-20 NOTE — Transfer of Care (Signed)
Immediate Anesthesia Transfer of Care Note  Patient: Michelle Martinez  Procedure(s) Performed: Procedure(s): LAPAROSCOPIC ASSISTED RIGHT COLECTOMY (Right)  Patient Location: PACU  Anesthesia Type:General  Level of Consciousness: awake, alert , oriented and patient cooperative  Airway & Oxygen Therapy: Patient Spontanous Breathing  Post-op Assessment: Report given to RN and Post -op Vital signs reviewed and stable  Post vital signs: Reviewed and stable  Last Vitals:  Vitals:   03/20/17 0827 03/20/17 1146  BP: 133/80 (!) 139/118  Pulse: 84 (!) 103  Resp: 18 17  Temp: 36.8 C     Last Pain:  Vitals:   03/20/17 0827  TempSrc: Oral         Complications: No apparent anesthesia complications

## 2017-03-21 ENCOUNTER — Encounter (HOSPITAL_COMMUNITY): Payer: Self-pay | Admitting: General Surgery

## 2017-03-21 MED ORDER — METHOCARBAMOL 500 MG PO TABS
500.0000 mg | ORAL_TABLET | Freq: Three times a day (TID) | ORAL | Status: DC
  Administered 2017-03-21 – 2017-03-23 (×6): 500 mg via ORAL
  Filled 2017-03-21 (×6): qty 1

## 2017-03-21 MED ORDER — DIPHENHYDRAMINE HCL 25 MG PO CAPS
25.0000 mg | ORAL_CAPSULE | Freq: Every evening | ORAL | Status: DC | PRN
  Administered 2017-03-21: 25 mg via ORAL
  Filled 2017-03-21: qty 1

## 2017-03-21 MED ORDER — PHENOL 1.4 % MT LIQD
1.0000 | OROMUCOSAL | Status: DC | PRN
  Administered 2017-03-21: 1 via OROMUCOSAL
  Filled 2017-03-21: qty 177

## 2017-03-21 MED ORDER — ACETAMINOPHEN 325 MG PO TABS
650.0000 mg | ORAL_TABLET | Freq: Four times a day (QID) | ORAL | Status: DC
  Administered 2017-03-21 – 2017-03-23 (×8): 650 mg via ORAL
  Filled 2017-03-21 (×8): qty 2

## 2017-03-21 MED ORDER — ALVIMOPAN 12 MG PO CAPS: 12.0000 mg | ORAL_CAPSULE | Freq: Two times a day (BID) | ORAL | Status: DC

## 2017-03-21 NOTE — Progress Notes (Signed)
Patient ID: Michelle Martinez, female   DOB: 02/29/1951, 66 y.o.   MRN: [id number]  [facility] Surgery Progress Note  1 Day Post-Op  Subjective: CC- right colon adenoma States that she did not sleep well due to pain last night. Fentanyl helps a little but wears off quickly. Has not yet been out of bed postop. No flatus or BM. Denies n/v.  Objective: Vital signs in last 24 hours: Temp:  [97.6 F (36.4 C)-99.2 F (37.3 C)] 99.2 F (37.3 C) (06/02 0547) Pulse Rate:  [75-103] 75 (06/02 0547) Resp:  [10-18] 16 (06/02 0547) BP: (100-135)/(46-80) 100/46 (06/02 0547) SpO2:  [94 %-100 %] 99 % (06/02 0547) Last BM Date: 03/20/17 (before surgery)  Intake/Output from previous day: 06/01 0701 - 06/02 0700 In: 3249.5 [P.O.:150; I.V.:3099.5] Out: 1230 [Urine:1180; Blood:50] Intake/Output this shift: No intake/output data recorded.  PE: Gen:  Alert, NAD, pleasant Card:  RRR, no M/G/R heard Pulm:  CTAB, no W/R/R, effort normal Abd: Soft, NT/ND, +BS, midline incision C/D/I with staples intact and honeycomb dressing in place Ext:  No erythema, edema, or tenderness BUE/BLE   Lab Results:  No results for input(s): WBC, HGB, HCT, PLT in the last 72 hours. BMET No results for input(s): NA, K, CL, CO2, GLUCOSE, BUN, CREATININE, CALCIUM in the last 72 hours. PT/INR No results for input(s): LABPROT, INR in the last 72 hours. CMP     Component Value Date/Time   NA 138 03/11/2017 1000   K 4.4 03/11/2017 1000   CL 105 03/11/2017 1000   CO2 25 03/11/2017 1000   GLUCOSE 96 03/11/2017 1000   BUN 18 03/11/2017 1000   CREATININE 0.70 03/11/2017 1000   CREATININE 0.89 12/26/2016 1320   CALCIUM 9.5 03/11/2017 1000   PROT 6.5 12/26/2016 1320   ALBUMIN 4.6 12/26/2016 1320   AST 23 12/26/2016 1320   ALT 24 12/26/2016 1320   ALKPHOS 53 12/26/2016 1320   BILITOT 0.5 12/26/2016 1320   GFRNONAA >60 03/11/2017 1000   GFRAA >60 03/11/2017 1000   Lipase  No results found for:  LIPASE     Studies/Results: No results found.  Anti-infectives: Anti-infectives    Start     Dose/Rate Route Frequency Ordered Stop   03/20/17 0830  cefoTEtan in Dextrose 5% (CEFOTAN) IVPB 2 g     2 g Intravenous To [facility] 03/19/17 1206 03/20/17 0940       Assessment/Plan Right colon adenoma with high grade dysplasia S/p LAPAROSCOPIC ASSISTED RIGHT COLECTOMY, Repair epigastric and umbilical hernia 6/1 Dr. Marlou Starks - POD 1 - surgical path pending - entereg protocol  ID - cefotetan perioperative FEN - IVF, CLD VTE - SCDs, heparin  Plan - continue clears until bowel function returns. Schedule tylenol and robaxin for better pain control. Please ambulate with patient and encourage IS.   LOS: 1 day    Jerrye Beavers , [facility] Surgery 03/21/2017, 7:38 AM Pager: [phone] Consults: [phone] Mon-Fri 7:00 am-4:30 pm Sat-Sun 7:00 am-11:30 am

## 2017-03-22 LAB — BASIC METABOLIC PANEL
Anion gap: 3 — ABNORMAL LOW (ref 5–15)
BUN: <5 mg/dL — ABNORMAL LOW (ref 6–20)
CO2: 23 mmol/L (ref 22–32)
Calcium: 7.9 mg/dL — ABNORMAL LOW (ref 8.9–10.3)
Chloride: 113 mmol/L — ABNORMAL HIGH (ref 101–111)
Creatinine, Ser: 0.57 mg/dL (ref 0.44–1.00)
GFR calc Af Amer: >60 mL/min (ref >60)
GFR calc non Af Amer: >60 mL/min (ref >60)
Glucose, Bld: 98 mg/dL (ref 65–99)
Potassium: 3.8 mmol/L (ref 3.5–5.1)
Sodium: 139 mmol/L (ref 135–145)

## 2017-03-22 LAB — CBC
HCT: 35.1 % — ABNORMAL LOW (ref 36.0–46.0)
Hemoglobin: 11.3 g/dL — ABNORMAL LOW (ref 12.0–15.0)
MCH: 30.6 pg (ref 26.0–34.0)
MCHC: 32.2 g/dL (ref 30.0–36.0)
MCV: 95.1 fL (ref 78.0–100.0)
Platelets: 123 10*3/uL — ABNORMAL LOW (ref 150–400)
RBC: 3.69 MIL/uL — ABNORMAL LOW (ref 3.87–5.11)
RDW: 13.9 % (ref 11.5–15.5)
WBC: 4.4 10*3/uL (ref 4.0–10.5)

## 2017-03-22 MED ORDER — FENTANYL CITRATE (PF) 100 MCG/2ML IJ SOLN: 25.0000 ug | Freq: Four times a day (QID) | INTRAMUSCULAR | Status: DC | PRN

## 2017-03-22 MED ORDER — OXYCODONE HCL 5 MG PO TABS
5.0000 mg | ORAL_TABLET | ORAL | Status: DC | PRN
  Administered 2017-03-22: 5 mg via ORAL
  Filled 2017-03-22: qty 1

## 2017-03-22 MED ORDER — PANTOPRAZOLE SODIUM 40 MG PO TBEC
40.0000 mg | DELAYED_RELEASE_TABLET | Freq: Every day | ORAL | Status: DC
  Administered 2017-03-22: 40 mg via ORAL
  Filled 2017-03-22: qty 1

## 2017-03-22 NOTE — Progress Notes (Signed)
RN informed on call MD that patient reports having three loose stools.  Awaiting a return call to see if MD wants to check for C. Diff or treat this in any way. RN will continue to monitor.

## 2017-03-22 NOTE — Progress Notes (Signed)
Patient ID: Michelle Martinez, female   DOB: October 11, 1951, 66 y.o.   MRN: [id number]  [facility] Surgery Progress Note  2 Days Post-Op  Subjective: CC- right colon adenoma Feeling much better today. Slept well last night. Patient reports BM x2. Denies n/v. Tolerating clears. Pain well controlled.  Objective: Vital signs in last 24 hours: Temp:  [98.2 F (36.8 C)-99.1 F (37.3 C)] 98.2 F (36.8 C) (06/03 0511) Pulse Rate:  [74-91] 80 (06/03 0511) Resp:  [16-20] 20 (06/03 0511) BP: (101-117)/(55-70) 117/67 (06/03 0511) SpO2:  [95 %-98 %] 98 % (06/03 0511) Last BM Date: 03/21/17  Intake/Output from previous day: 06/02 0701 - 06/03 0700 In: 3040 [P.O.:840; I.V.:2200] Out: 1800 [Urine:1800] Intake/Output this shift: No intake/output data recorded.  PE: Gen:  Alert, NAD, pleasant Card:  RRR, no M/G/R heard Pulm:  CTAB, no W/R/R, effort normal Abd: Soft, mild distension, NT, +BS, midline incision C/D/I with staples intact and honeycomb dressing in place Ext:  No erythema, edema, or tenderness BUE/BLE   Lab Results:   Recent Labs  03/22/17 0342  WBC 4.4  HGB 11.3*  HCT 35.1*  PLT 123*   BMET  Recent Labs  03/22/17 0342  NA 139  K 3.8  CL 113*  CO2 23  GLUCOSE 98  BUN <5*  CREATININE 0.57  CALCIUM 7.9*   PT/INR No results for input(s): LABPROT, INR in the last 72 hours. CMP     Component Value Date/Time   NA 139 03/22/2017 0342   K 3.8 03/22/2017 0342   CL 113 (H) 03/22/2017 0342   CO2 23 03/22/2017 0342   GLUCOSE 98 03/22/2017 0342   BUN <5 (L) 03/22/2017 0342   CREATININE 0.57 03/22/2017 0342   CREATININE 0.89 12/26/2016 1320   CALCIUM 7.9 (L) 03/22/2017 0342   PROT 6.5 12/26/2016 1320   ALBUMIN 4.6 12/26/2016 1320   AST 23 12/26/2016 1320   ALT 24 12/26/2016 1320   ALKPHOS 53 12/26/2016 1320   BILITOT 0.5 12/26/2016 1320   GFRNONAA >60 03/22/2017 0342   GFRAA >60 03/22/2017 0342   Lipase  No results found for:  LIPASE     Studies/Results: No results found.  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 Marlou Starks - POD 2 - surgical path pending - entereg protocol >> BM x2 yesterday  ID - cefotetan perioperative FEN - IVF, full liquids VTE - SCDs, heparin  Plan - advance to full liquids. D/c entereg. Add PO pain meds. Continue to ambulate.    LOS: 2 days    Jerrye Beavers , [facility] Surgery 03/22/2017, 7:54 AM Pager: [phone] Consults: [phone] Mon-Fri 7:00 am-4:30 pm Sat-Sun 7:00 am-11:30 am

## 2017-03-22 NOTE — Progress Notes (Signed)
Per Dr. Kieth Brightly we are not going to treat patient's diarrhea or test for C. diff.

## 2017-03-23 MED ORDER — HYDROCODONE-ACETAMINOPHEN 5-325 MG PO TABS: 1.0000 | ORAL_TABLET | ORAL | 0 refills | Status: DC | PRN

## 2017-03-23 NOTE — Care Management Note (Signed)
Case Management Note  Patient Details  Name: Michelle Martinez MRN: [id number] Date of Birth: Mar 01, 1951  Subjective/Objective:                    Action/Plan:  S/p LAPAROSCOPIC ASSISTED RIGHT COLECTOMY, Repair epigastric and umbilical JJHERD4/0  Expected Discharge Date:                  Expected Discharge Plan:  Home/Self Care  In-House Referral:     Discharge planning Services     Post Acute Care Choice:    Choice offered to:     DME Arranged:    DME Agency:     HH Arranged:    [place] Agency:     Status of Service:  In process, will continue to follow  If discussed at Long Length of Stay Meetings, dates discussed:    Additional Comments:  Marilu Favre, RN 03/23/2017, 10:02 AM

## 2017-03-23 NOTE — Plan of Care (Signed)
Problem: Education: Goal: Knowledge of [facility] Education information/materials will improve Outcome: Progressing POC reviewed with pt.   

## 2017-03-23 NOTE — Progress Notes (Signed)
Discharge instructions printed and reviewed with patient and family, and copy given for them to take home. All questions addressed at this time. New prescriptions reviewed and paper script given for patient to fill after discharge. IV removed, and assessed bandages to ensure they were clean/dry/intact. Room searched for patient belongings, and confirmed with patient that all valuables were accounted for. Family assisted patient to dress. Awaiting volunteer staff to escort patient to discharge via wheelchair.

## 2017-03-23 NOTE — Discharge Summary (Signed)
Physician Discharge Summary  Patient ID: IRAIDA CRAGIN MRN: [id number] DOB/AGE: 1950-12-17 66 y.o.  Admit date: 03/20/2017 Discharge date: 03/23/2017  Admission Diagnoses:  Discharge Diagnoses:  Active Problems:   Villous adenoma of right colon   Discharged Condition: good  Hospital Course: the patient underwent lap right colectomy. She tolerated surgery well. She gradually progressed with no issues and on pod 3 she was ready for discharge home  Consults: None  Significant Diagnostic Studies: none  Treatments: surgery: as above  Discharge Exam: Blood pressure 126/65, pulse 79, temperature 98.5 F (36.9 C), temperature source Oral, resp. rate 20, height 5\' 3"  (1.6 m), weight 56.7 kg (125 lb), SpO2 97 %. General appearance: alert and cooperative Resp: clear to auscultation bilaterally Cardio: regular rate and rhythm GI: soft, nontender. incisions look good  Disposition: 01-Home or Self Care  Discharge Instructions    Call MD for:  difficulty breathing, headache or visual disturbances    Complete by:  As directed    Call MD for:  extreme fatigue    Complete by:  As directed    Call MD for:  hives    Complete by:  As directed    Call MD for:  persistant dizziness or light-headedness    Complete by:  As directed    Call MD for:  persistant nausea and vomiting    Complete by:  As directed    Call MD for:  redness, tenderness, or signs of infection (pain, swelling, redness, odor or green/yellow discharge around incision site)    Complete by:  As directed    Call MD for:  severe uncontrolled pain    Complete by:  As directed    Call MD for:  temperature >100.4    Complete by:  As directed    Diet - low sodium heart healthy    Complete by:  As directed    Discharge instructions    Complete by:  As directed    May shower. No heavy lifting. Diet as tolerated   Increase activity slowly    Complete by:  As directed    No wound care    Complete by:  As directed       Allergies as of 03/23/2017      Reactions   Codeine Nausea Only, Other (See Comments)   HALLUCINATIONS   Crestor [rosuvastatin] Other (See Comments)   MYALGIAS, CRAMPING    Sulfur Swelling   LIPS, HANDS, FEET   Adhesive [tape] Other (See Comments)   blisters      Medication List    TAKE these medications   acetaminophen 500 MG tablet Commonly known as:  TYLENOL Take 1,000 mg by mouth daily as needed for moderate pain.   ARTHROTEC 75 75-0.2 MG per tablet Generic drug:  diclofenac-misoprostol Take 1 tablet by mouth every morning. Reported on 11/13/2015   buPROPion 150 MG 24 hr tablet Commonly known as:  WELLBUTRIN XL take 1 tablet by mouth once daily   diphenhydrAMINE 25 MG tablet Commonly known as:  BENADRYL Take 25 mg by mouth at bedtime as needed for allergies.   DULoxetine 60 MG capsule Commonly known as:  CYMBALTA take 1 capsule by mouth once daily   estradiol 0.1 MG/GM vaginal cream Commonly known as:  ESTRACE 1 gram vaginally twice weekly   famotidine 20 MG tablet Commonly known as:  PEPCID Take 20 mg by mouth daily as needed for heartburn or indigestion.   fluticasone 50 MCG/ACT nasal spray Commonly known as:  FLONASE Place 2 sprays into both nostrils every morning.   HYDROcodone-acetaminophen 5-325 MG tablet Commonly known as:  NORCO/VICODIN Take 1-2 tablets by mouth every 4 (four) hours as needed for moderate pain or severe pain.   loratadine 10 MG tablet Commonly known as:  CLARITIN Take 10 mg by mouth every morning.   Probiotic Caps Take 1 capsule by mouth daily.   psyllium 58.6 % powder Commonly known as:  METAMUCIL Take 1 packet by mouth daily.   RECLAST 5 MG/100ML Soln injection Generic drug:  zoledronic acid Inject 5 mg into the vein. annually   traMADol 50 MG tablet Commonly known as:  ULTRAM Take 1-2 tablets (50-100 mg total) by mouth every 6 (six) hours as needed.   Turmeric Curcumin Caps Take 1 capsule by mouth daily.       Follow-up Information    Autumn Messing III, MD Follow up in 1 week(s).   Specialty:  General Surgery Contact information: [place] [address] [phone]           Signed: Merrie Roof 03/23/2017, 12:47 PM

## 2017-03-23 NOTE — Progress Notes (Signed)
3 Days Post-Op   Subjective/Chief Complaint: No complaints. Tolerating diet and having bm's. No pain   Objective: Vital signs in last 24 hours: Temp:  [98 F (36.7 C)-98.5 F (36.9 C)] 98.5 F (36.9 C) (06/04 0420) Pulse Rate:  [79-80] 79 (06/04 0420) Resp:  [18-20] 20 (06/04 0420) BP: (120-126)/(65-76) 126/65 (06/04 0420) SpO2:  [95 %-98 %] 97 % (06/04 0420) Last BM Date: 03/22/17  Intake/Output from previous day: 06/03 0701 - 06/04 0700 In: 1080 [P.O.:480; I.V.:600] Out: 1100 [Urine:1100] Intake/Output this shift: Total I/O In: 480 [P.O.:480] Out: 0   General appearance: alert and cooperative Resp: clear to auscultation bilaterally Cardio: regular rate and rhythm GI: soft, nontender. incisions look good. good bs  Lab Results:   Recent Labs  03/22/17 0342  WBC 4.4  HGB 11.3*  HCT 35.1*  PLT 123*   BMET  Recent Labs  03/22/17 0342  NA 139  K 3.8  CL 113*  CO2 23  GLUCOSE 98  BUN <5*  CREATININE 0.57  CALCIUM 7.9*   PT/INR No results for input(s): LABPROT, INR in the last 72 hours. ABG No results for input(s): PHART, HCO3 in the last 72 hours.  Invalid input(s): PCO2, PO2  Studies/Results: No results found.  Anti-infectives: Anti-infectives    Start     Dose/Rate Route Frequency Ordered Stop   03/20/17 0830  cefoTEtan in Dextrose 5% (CEFOTAN) IVPB 2 g     2 g Intravenous To [facility] 03/19/17 1206 03/20/17 0940      Assessment/Plan: s/p Procedure(s): LAPAROSCOPIC ASSISTED RIGHT COLECTOMY (Right) Advance diet Discharge  LOS: 3 days    TOTH III,PAUL S 03/23/2017

## 2017-12-18 ENCOUNTER — Other Ambulatory Visit: Payer: Self-pay | Admitting: Obstetrics & Gynecology

## 2017-12-18 NOTE — Telephone Encounter (Signed)
Medication refill request: Duloxetine 60mg  #30 Last AEX:  11-20-16 Next AEX: 02-26-18 Last MMG (if hormonal medication request): 10-31-16 abnormal with bilateral biopsies--SEE EPIC Refill authorized: please advise

## 2018-02-11 ENCOUNTER — Other Ambulatory Visit: Payer: Self-pay | Admitting: Obstetrics & Gynecology

## 2018-02-11 NOTE — Telephone Encounter (Signed)
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 Today please advise.

## 2018-02-26 ENCOUNTER — Other Ambulatory Visit: Payer: Self-pay

## 2018-02-26 ENCOUNTER — Ambulatory Visit: Payer: Medicare Other | Admitting: Obstetrics & Gynecology

## 2018-02-26 ENCOUNTER — Telehealth: Payer: Self-pay | Admitting: Obstetrics & Gynecology

## 2018-02-26 ENCOUNTER — Telehealth: Payer: Self-pay

## 2018-02-26 ENCOUNTER — Encounter: Payer: Self-pay | Admitting: Obstetrics & Gynecology

## 2018-02-26 VITALS — BP 120/76 | HR 88 | Resp 16 | Ht 62.0 in | Wt 124.0 lb

## 2018-02-26 DIAGNOSIS — M810 Age-related osteoporosis without current pathological fracture: Secondary | ICD-10-CM | POA: Diagnosis not present

## 2018-02-26 DIAGNOSIS — Z0000 Encounter for general adult medical examination without abnormal findings: Secondary | ICD-10-CM

## 2018-02-26 DIAGNOSIS — D126 Benign neoplasm of colon, unspecified: Secondary | ICD-10-CM | POA: Diagnosis not present

## 2018-02-26 DIAGNOSIS — Z01419 Encounter for gynecological examination (general) (routine) without abnormal findings: Secondary | ICD-10-CM

## 2018-02-26 LAB — POCT URINALYSIS DIPSTICK
Bilirubin, UA: NEGATIVE
Blood, UA: NEGATIVE
Glucose, UA: NEGATIVE
Ketones, UA: NEGATIVE
Nitrite, UA: NEGATIVE
Protein, UA: NEGATIVE
Urobilinogen, UA: 0.2 E.U./dL
pH, UA: 6 (ref 5.0–8.0)

## 2018-02-26 MED ORDER — DULOXETINE HCL 60 MG PO CPEP: 60.0000 mg | ORAL_CAPSULE | Freq: Every day | ORAL | 4 refills | Status: DC

## 2018-02-26 MED ORDER — HYDROCORTISONE 2.5 % RE CREA: 1.0000 "application " | TOPICAL_CREAM | Freq: Two times a day (BID) | RECTAL | 1 refills | Status: AC

## 2018-02-26 MED ORDER — BUPROPION HCL ER (XL) 150 MG PO TB24: 150.0000 mg | ORAL_TABLET | Freq: Every day | ORAL | 4 refills | Status: DC

## 2018-02-26 MED ORDER — NONFORMULARY OR COMPOUNDED ITEM: 4 refills | Status: DC

## 2018-02-26 NOTE — Progress Notes (Signed)
67 y.o. [id number] WidowedCaucasianF here for annual exam.  Doing well.  Did have a partial colectomy last year due to high grade dysplasia with adenoma.  Removed 1/3 of colon.  Stool is a little looser and a little more frequent than before her colectomy.  Has repeat scheduled for June 5th.  No rectal bleeding.    Having increased issues with hot flashes and vaginal dryness.    PCP:  Dr. Inda Merlin.  Did blood work then.  No LMP recorded. Patient has had a hysterectomy.          Sexually active: No.  The current method of family planning is status post hysterectomy.    Exercising: Yes.    walking Smoker:  no  Health Maintenance: Pap: years ago History of abnormal Pap:  no MMG:  12/11/16 Right breast lumpectomy  Colonoscopy:  03/20/17 Right Colectomy  BMD:   10/28/16 Osteoporosis  TDaP:  2014 Pneumonia vaccine(s):  No  Shingrix: completed Shingrix 2018 Hep C testing: 11/20/16 Neg  Screening Labs: PCP  UA: wbc=trace   reports that she quit smoking about 40 years ago. She has never used smokeless tobacco. She reports that she drinks about 4.2 - 6.0 oz of alcohol per week. She reports that she does not use drugs.  Past Medical History:  Diagnosis Date  . Anxiety   . Arthritis    hands & back - OA  . Complication of anesthesia    HYPOTENSION , DIFFICULTY WAKING UP   . Depression   . Dysrhythmia    OCC RAPID HEART BEAT  . Fatigue   . GERD (gastroesophageal reflux disease)   . Headache(784.0)    ocasonall sinus hedache  . Hypotension   . Melanoma ([place])   . Osteoporosis   . Palpitation   . PONV (postoperative nausea and vomiting)   . Syncope    SEES DR Mare Ferrari     Past Surgical History:  Procedure Laterality Date  . ABDOMINAL HYSTERECTOMY    . BACK SURGERY     mid back, cervical   . BREAST LUMPECTOMY WITH RADIOACTIVE SEED LOCALIZATION Right 12/11/2016   Procedure: RIGHT BREAST LUMPECTOMY WITH RADIOACTIVE SEED LOCALIZATION;  Surgeon: Autumn Messing III, MD;  Location: [facility];  Service:  General;  Laterality: Right;  . CESAREAN SECTION    . CESAREAN SECTION    . LAPAROSCOPIC RIGHT COLECTOMY Right 03/20/2017   Procedure: LAPAROSCOPIC ASSISTED RIGHT COLECTOMY;  Surgeon: Jovita Kussmaul, MD;  Location: [facility];  Service: General;  Laterality: Right;  . LUMBAR FUSION  12/15/2012   Dr Vertell Limber  . NECK SURGERY    . TONSILLECTOMY    . TOTAL ABDOMINAL HYSTERECTOMY W/ BILATERAL SALPINGOOPHORECTOMY     Part of one ovary remians    Current Outpatient Medications  Medication Sig Dispense Refill  . acetaminophen (TYLENOL) 500 MG tablet Take 1,000 mg by mouth daily as needed for moderate pain.     Marland Kitchen ARTHROTEC 75 75-200 MG-MCG per tablet Take 1 tablet by mouth every morning. Reported on 11/13/2015    . buPROPion (WELLBUTRIN XL) 150 MG 24 hr tablet TAKE 1 TABLET BY MOUTH ONCE DAILY 30 tablet 0  . diphenhydrAMINE (BENADRYL) 25 MG tablet Take 25 mg by mouth at bedtime as needed for allergies.    . DULoxetine (CYMBALTA) 60 MG capsule take 1 capsule by mouth once daily 30 capsule 2  . estradiol (ESTRACE) 0.1 MG/GM vaginal cream 1 gram vaginally twice weekly 42.5 g 3  . famotidine (PEPCID) 20 MG  tablet Take 20 mg by mouth daily as needed for heartburn or indigestion.    . fluticasone (FLONASE) 50 MCG/ACT nasal spray Place 2 sprays into both nostrils every morning.    Marland Kitchen HYDROcodone-acetaminophen (NORCO/VICODIN) 5-325 MG tablet Take 1-2 tablets by mouth every 4 (four) hours as needed for moderate pain or severe pain. 15 tablet 0  . loratadine (CLARITIN) 10 MG tablet Take 10 mg by mouth every morning.    . Probiotic CAPS Take 1 capsule by mouth daily.    . psyllium (METAMUCIL) 58.6 % powder Take 1 packet by mouth daily.    . zoledronic acid (RECLAST) 5 MG/100ML SOLN injection Inject 5 mg into the vein. annually     No current facility-administered medications for this visit.     Family History  Problem Relation Age of Onset  . Cancer Mother   . Heart attack Father   . Cancer Father     Review  of Systems  HENT: Positive for congestion.   Gastrointestinal: Positive for diarrhea.  Genitourinary: Positive for dysuria and urgency.       Loss of urine with sneeze or cough  Vulvar itching   Musculoskeletal: Positive for myalgias.  Neurological:       Difficult with memory   All other systems reviewed and are negative.   Exam:   BP 120/76 (BP Location: Right Arm, Patient Position: Sitting, Cuff Size: Normal)   Pulse 88   Resp 16   Ht 5\' 2"  (1.575 m)   Wt 124 lb (56.2 kg)   BMI 22.68 kg/m    Height: 5\' 2"  (157.5 cm)  Ht Readings from Last 3 Encounters:  02/26/18 5\' 2"  (1.575 m)  03/20/17 5\' 3"  (1.6 m)  03/11/17 5\' 3"  (1.6 m)    General appearance: alert, cooperative and appears stated age Head: Normocephalic, without obvious abnormality, atraumatic Neck: no adenopathy, supple, symmetrical, trachea midline and thyroid normal to inspection and palpation Lungs: clear to auscultation bilaterally Breasts: normal appearance, no masses or tenderness Heart: regular rate and rhythm Abdomen: soft, non-tender; bowel sounds normal; no masses,  no organomegaly Extremities: extremities normal, atraumatic, no cyanosis or edema Skin: Skin color, texture, turgor normal. No rashes or lesions Lymph nodes: Cervical, supraclavicular, and axillary nodes normal. No abnormal inguinal nodes palpated Neurologic: Grossly normal   Pelvic: External genitalia:  no lesions              Urethra:  normal appearing urethra with no masses, tenderness or lesions              Bartholins and Skenes: normal                 Vagina: normal appearing vagina with normal color and discharge, no lesions              Cervix: absent              Pap taken: No. Bimanual Exam:  Uterus:  uterus absent              Adnexa: no mass, fullness, tenderness               Rectovaginal: Confirms               Anus:  normal sphincter tone, no lesions  Chaperone was present for exam.  A:  Well Woman with normal  exam PMP, no HRT OAB Vaginal atrophic changes H/O TAH/USO and partial other ovary removal H/O high grade adenoma dysplasia Osteoporosis, on  Reclast, has received 3 infusions Hemorrhoids  P:   Mammogram guidelines.  Doing 3D MMG. pap smear not indicated Anusol HC 2.5% cream BID as needed for flares of hemorrhoids RF for Wellbutrin 150mg  Xl daily.  #90/4RF and Cymbalta 60mg  daily.  #90/4RF Release of records from Dr. Inda Merlin regarding lab work and vaccines. Next Reclast is due so will get scheduled for pt.  Plan BMD about a year from now Trial of compounded estradiol with Vit E for vaginal and vulvar dryness she is experiencing--estradiol 0.02% with Vit E, 2ml pv and externally three times weekly.  Will plan follow up in 3 months. return annually or prn

## 2018-02-26 NOTE — Telephone Encounter (Signed)
-----   Message from Megan Salon, MD sent at 02/26/2018 11:21 AM EDT ----- Regarding: reclast Pt needs to have reclast again this year.  Last was 01/13/17.  Dx:  Osteoporosis.  Can you please precert this?  Thanks.

## 2018-02-26 NOTE — Telephone Encounter (Signed)
Allie from [facility] left a voicemail stating that the patient is not due for a bone density test until January of 2020 because insurance will only cover it once every two years.

## 2018-02-26 NOTE — Telephone Encounter (Signed)
Spoke with patient. Advised will need to return to the office for CMP before Reclast can be ordered and scheduled. Patient is agreeable. Appointment scheduled for 03/02/2018 at 10 am. Patient is agreeable to date and time. Reclast benefits reviewed with patient.  Routing to provider for final review. Patient agreeable to disposition. Will close encounter.

## 2018-02-26 NOTE — Telephone Encounter (Signed)
Reclast order form completed. Sending to [facility] for pre-authorization. Patient will need to return to the office for CMP.

## 2018-02-26 NOTE — Telephone Encounter (Signed)
Spoke with patient. Advised of message as seen [facility] from [facility]. Patient verbalizes understanding. Advised will need to return to the office for CMP before Reclast can be ordered and scheduled. Patient is agreeable. Appointment scheduled for 03/02/2018 at 10 am. Patient is agreeable to date and time. Reclast benefits reviewed with patient.  Routing to provider for final review. Patient agreeable to disposition. Will close encounter.

## 2018-03-02 ENCOUNTER — Other Ambulatory Visit: Payer: Self-pay

## 2018-03-02 ENCOUNTER — Other Ambulatory Visit (INDEPENDENT_AMBULATORY_CARE_PROVIDER_SITE_OTHER): Payer: Medicare Other

## 2018-03-02 DIAGNOSIS — M810 Age-related osteoporosis without current pathological fracture: Secondary | ICD-10-CM

## 2018-03-02 DIAGNOSIS — Z0000 Encounter for general adult medical examination without abnormal findings: Secondary | ICD-10-CM

## 2018-03-03 LAB — COMPREHENSIVE METABOLIC PANEL
ALT: 28 IU/L (ref 0–32)
AST: 26 IU/L (ref 0–40)
Albumin/Globulin Ratio: 2.6 — ABNORMAL HIGH (ref 1.2–2.2)
Albumin: 4.7 g/dL (ref 3.6–4.8)
Alkaline Phosphatase: 55 IU/L (ref 39–117)
BUN/Creatinine Ratio: 26 (ref 12–28)
BUN: 19 mg/dL (ref 8–27)
Bilirubin Total: 0.4 mg/dL (ref 0.0–1.2)
CO2: 22 mmol/L (ref 20–29)
Calcium: 9.8 mg/dL (ref 8.7–10.3)
Chloride: 104 mmol/L (ref 96–106)
Creatinine, Ser: 0.73 mg/dL (ref 0.57–1.00)
GFR calc Af Amer: 100 mL/min/{1.73_m2} (ref >59)
GFR calc non Af Amer: 87 mL/min/{1.73_m2} (ref >59)
Globulin, Total: 1.8 g/dL (ref 1.5–4.5)
Glucose: 93 mg/dL (ref 65–99)
Potassium: 4.2 mmol/L (ref 3.5–5.2)
Sodium: 144 mmol/L (ref 134–144)
Total Protein: 6.5 g/dL (ref 6.0–8.5)

## 2018-03-05 ENCOUNTER — Other Ambulatory Visit: Payer: Self-pay | Admitting: Obstetrics & Gynecology

## 2018-03-05 NOTE — Telephone Encounter (Signed)
Medication refill request: wellbutrin 150mg  Last AEX:  02-26-18 Next AEX: 05-27-19 Last MMG (if hormonal medication request): n/a Refill authorized: rx denied. rx was sent for 90 with 4 refills at [facility] on 02-26-18

## 2018-03-09 ENCOUNTER — Telehealth: Payer: Self-pay | Admitting: Obstetrics & Gynecology

## 2018-03-09 NOTE — Telephone Encounter (Signed)
Left message to call [place] at [phone].  Notes recorded by Gwendlyn Deutscher, RN on 03/09/2018 at 12:17 PM EDT Left message to call [place] at [phone].  Order for Reclast to [facility] for signature. ------  Notes recorded by Megan Salon, MD on 03/08/2018 at 4:38 PM EDT CMP is normal. OK to continue with reclast.

## 2018-03-09 NOTE — Telephone Encounter (Signed)
Patient said she is returning a call from our office from this morning, no open telephone notes. Patient thinks this about an infusion she is to have at [facility] long.

## 2018-03-09 NOTE — Telephone Encounter (Signed)
Spoke with patient. Results given. Patient verbalizes understanding. Aware Reclast order will be sent to [facility] Stay for scheduling. Patient is requesting appointment on 6/10, 6/11, or 6/12. Advised will call to schedule after order is complete and return call with appointment date and time.

## 2018-03-11 NOTE — Telephone Encounter (Signed)
Patient is scheduled for Reclast at [facility] Stay on 03/22/2018 at 11 am. Patient is aware and agreeable to date and time.  Routing to provider for final review. Patient agreeable to disposition. Will close encounter.

## 2018-03-19 ENCOUNTER — Telehealth: Payer: Self-pay | Admitting: Obstetrics & Gynecology

## 2018-03-19 NOTE — Telephone Encounter (Signed)
Karena Addison, from [facility] called about the patients appointment on Monday.

## 2018-03-19 NOTE — Telephone Encounter (Signed)
Spoke with patient. Patient states that she has double booked herself for 6/3 and needs to reschedule appointment for Reclast. Advised will call [facility] Stay and reschedule. Spoke with Michelle Martinez, rescheduled Reclast for 03/29/2018 at 11 am. Spoke with patient. Advised of appointment date and time. Patient verbalizes understanding  INSTRUCTIONS PRE-INFUSION for Reclast:   1. You may eat normally before receiving your infusion.   2. Drink two (2) glasses of fluids, such as water, within a few hours before receiving Reclast to help prevent kidney problems.   3. On the day of your infusion, DO NOT take any multivitamins.   4. On the day of your infusion, DO NOT take any other medications that are used to treat osteoporosis (like Fosamax, Boniva, Evista, Actonel).   5. Limit intake of NSAIDS (like Motrin) before and after the infusion.   6. You need to take an oral calcium supplement of 1200-1500 mg daily AND a Vitamin D supplement of at least 400IU daily.   Routing to provider for final review. Patient agreeable to disposition. Will close encounter.

## 2018-03-19 NOTE — Telephone Encounter (Signed)
Left message to call Kaitlyn at [phone]. 

## 2018-03-22 ENCOUNTER — Ambulatory Visit (HOSPITAL_COMMUNITY): Payer: Self-pay

## 2018-03-29 ENCOUNTER — Ambulatory Visit (HOSPITAL_COMMUNITY)
Admission: RE | Admit: 2018-03-29 | Discharge: 2018-03-29 | Disposition: A | Discharge status: Home / Self Care | Payer: Medicare Other | Source: Ambulatory Visit | Attending: Obstetrics & Gynecology | Admitting: Obstetrics & Gynecology

## 2018-03-29 ENCOUNTER — Encounter (HOSPITAL_COMMUNITY): Payer: Self-pay

## 2018-03-29 DIAGNOSIS — M810 Age-related osteoporosis without current pathological fracture: Secondary | ICD-10-CM | POA: Insufficient documentation

## 2018-03-29 MED ORDER — ZOLEDRONIC ACID 5 MG/100ML IV SOLN
5.0000 mg | Freq: Once | INTRAVENOUS | Status: AC
  Administered 2018-03-29: 5 mg via INTRAVENOUS
  Filled 2018-03-29: qty 100

## 2018-03-29 MED ORDER — SODIUM CHLORIDE 0.9 % IV SOLN
Freq: Once | INTRAVENOUS | Status: AC
  Administered 2018-03-29: 11:00:00 via INTRAVENOUS

## 2018-03-29 NOTE — Discharge Instructions (Signed)
Zoledronic Acid injection (Paget's Disease, Osteoporosis)/ Reclast °What is this medicine? °ZOLEDRONIC ACID (ZOE le dron ik AS id) lowers the amount of calcium loss from bone. It is used to treat Paget's disease and osteoporosis in women. °This medicine may be used for other purposes; ask your health care provider or pharmacist if you have questions. °COMMON BRAND NAME(S): Reclast, Zometa °What should I tell my health care provider before I take this medicine? °They need to know if you have any of these conditions: °-aspirin-sensitive asthma °-cancer, especially if you are receiving medicines used to treat cancer °-dental disease or wear dentures °-infection °-kidney disease °-low levels of calcium in the blood °-past surgery on the parathyroid gland or intestines °-receiving corticosteroids like dexamethasone or prednisone °-an unusual or allergic reaction to zoledronic acid, other medicines, foods, dyes, or preservatives °-pregnant or trying to get pregnant °-breast-feeding °How should I use this medicine? °This medicine is for infusion into a vein. It is given by a health care professional in a hospital or clinic setting. °Talk to your pediatrician regarding the use of this medicine in children. This medicine is not approved for use in children. °Overdosage: If you think you have taken too much of this medicine contact a poison control center or emergency room at once. °NOTE: This medicine is only for you. Do not share this medicine with others. °What if I miss a dose? °It is important not to miss your dose. Call your doctor or health care professional if you are unable to keep an appointment. °What may interact with this medicine? °-certain antibiotics given by injection °-NSAIDs, medicines for pain and inflammation, like ibuprofen or naproxen °-some diuretics like bumetanide, furosemide °-teriparatide °This list may not describe all possible interactions. Give your health care provider a list of all the  medicines, herbs, non-prescription drugs, or dietary supplements you use. Also tell them if you smoke, drink alcohol, or use illegal drugs. Some items may interact with your medicine. °What should I watch for while using this medicine? °Visit your doctor or health care professional for regular checkups. It may be some time before you see the benefit from this medicine. Do not stop taking your medicine unless your doctor tells you to. Your doctor may order blood tests or other tests to see how you are doing. °Women should inform their doctor if they wish to become pregnant or think they might be pregnant. There is a potential for serious side effects to an unborn child. Talk to your health care professional or pharmacist for more information. °You should make sure that you get enough calcium and vitamin D while you are taking this medicine. Discuss the foods you eat and the vitamins you take with your health care professional. °Some people who take this medicine have severe bone, joint, and/or muscle pain. This medicine may also increase your risk for jaw problems or a broken thigh bone. Tell your doctor right away if you have severe pain in your jaw, bones, joints, or muscles. Tell your doctor if you have any pain that does not go away or that gets worse. °Tell your dentist and dental surgeon that you are taking this medicine. You should not have major dental surgery while on this medicine. See your dentist to have a dental exam and fix any dental problems before starting this medicine. Take good care of your teeth while on this medicine. Make sure you see your dentist for regular follow-up appointments. °What side effects may I notice from receiving this   medicine? °Side effects that you should report to your doctor or health care professional as soon as possible: °-allergic reactions like skin rash, itching or hives, swelling of the face, lips, or tongue °-anxiety, confusion, or depression °-breathing  problems °-changes in vision °-eye pain °-feeling faint or lightheaded, falls °-jaw pain, especially after dental work °-mouth sores °-muscle cramps, stiffness, or weakness °-redness, blistering, peeling or loosening of the skin, including inside the mouth °-trouble passing urine or change in the amount of urine °Side effects that usually do not require medical attention (report to your doctor or health care professional if they continue or are bothersome): °-bone, joint, or muscle pain °-constipation °-diarrhea °-fever °-hair loss °-irritation at site where injected °-loss of appetite °-nausea, vomiting °-stomach upset °-trouble sleeping °-trouble swallowing °-weak or tired °This list may not describe all possible side effects. Call your doctor for medical advice about side effects. You may report side effects to FDA at 1-800-FDA-1088. °Where should I keep my medicine? °This drug is given in a hospital or clinic and will not be stored at home. °NOTE: This sheet is a summary. It may not cover all possible information. If you have questions about this medicine, talk to your doctor, pharmacist, or health care provider. °© 2018 [employer]vier/Gold Standard (2014-03-04 14:19:57) ° °

## 2018-05-03 ENCOUNTER — Other Ambulatory Visit: Payer: Self-pay | Admitting: Obstetrics & Gynecology

## 2018-05-03 NOTE — Telephone Encounter (Signed)
Medication refill request: cymbalta 60mg   Last AEX:  02/26/18 Next AEX: 05/27/19 Last MMG (if hormonal medication request): 12/11/16 right breast lumpectomy  Refill authorized: Please refill if appropriate.

## 2018-11-24 DIAGNOSIS — G959 Disease of spinal cord, unspecified: Secondary | ICD-10-CM

## 2018-11-24 DIAGNOSIS — M5412 Radiculopathy, cervical region: Secondary | ICD-10-CM | POA: Insufficient documentation

## 2018-11-24 DIAGNOSIS — M4802 Spinal stenosis, cervical region: Secondary | ICD-10-CM

## 2018-11-24 DIAGNOSIS — M5126 Other intervertebral disc displacement, lumbar region: Secondary | ICD-10-CM

## 2018-11-24 HISTORY — DX: Spinal stenosis, cervical region: M48.02

## 2018-11-24 HISTORY — DX: Other intervertebral disc displacement, lumbar region: M51.26

## 2018-11-24 HISTORY — DX: Radiculopathy, cervical region: M54.12

## 2018-11-24 HISTORY — DX: Disease of spinal cord, unspecified: G95.9

## 2018-12-01 ENCOUNTER — Other Ambulatory Visit: Payer: Self-pay | Admitting: Neurosurgery

## 2018-12-16 NOTE — Pre-Procedure Instructions (Signed)
Michelle Martinez  12/16/2018      [id number] - [place], [address] AT [place] [address] Phone: [phone] Fax: [phone]  [id number] [place], [place] - [address] AT [address] [place] [address] Phone: [phone] Fax: [phone]    Your procedure is scheduled on December 23, 2018.  Report to [facility] "[facility]" at 1240 PM.  Call this number if you have problems the morning of surgery:  [phone]   Remember:  Do not eat or drink after midnight.    Take these medicines the morning of surgery with A SIP OF WATER  buPROPion (WELLBUTRIN XL) DULoxetine (CYMBALTA)  famotidine (PEPCID)  fluticasone (FLONASE) 50 MCG/ACT nasal spray loratadine (CLARITIN)   7 days prior to surgery STOP taking any Aspirin (unless otherwise instructed by your surgeon), Aleve, Naproxen, Ibuprofen, Motrin, Advil, Goody's, BC's, all herbal medications, fish oil, and all vitamins    Do not wear jewelry, make-up or nail polish.  Do not wear lotions, powders, or perfumes, or deodorant.  Do not shave 48 hours prior to surgery.    Do not bring valuables to the hospital.  [facility] is not responsible for any belongings or valuables.  Contacts, dentures or bridgework may not be worn into surgery.  Leave your suitcase in the car.  After surgery it may be brought to your room.  For patients admitted to the hospital, discharge time will be determined by your treatment team.  Patients discharged the day of surgery will not be allowed to drive home.    [facility]- Preparing For Surgery  Before surgery, you can play an important role. Because skin is not sterile, your skin needs to be as free of germs as possible. You can reduce the number of germs on your skin by washing with CHG (chlorahexidine gluconate) Soap before  surgery.  CHG is an antiseptic cleaner which kills germs and bonds with the skin to continue killing germs even after washing.    Oral Hygiene is also important to reduce your risk of infection.  Remember - BRUSH YOUR TEETH THE MORNING OF SURGERY WITH YOUR REGULAR TOOTHPASTE  Please do not use if you have an allergy to CHG or antibacterial soaps. If your skin becomes reddened/irritated stop using the CHG.  Do not shave (including legs and underarms) for at least 48 hours prior to first CHG shower. It is OK to shave your face.  Please follow these instructions carefully.   1. Shower the NIGHT BEFORE SURGERY and the MORNING OF SURGERY with CHG.   2. If you chose to wash your hair, wash your hair first as usual with your normal shampoo.  3. After you shampoo, rinse your hair and body thoroughly to remove the shampoo.  4. Use CHG as you would any other liquid soap. You can apply CHG directly to the skin and wash gently with a scrungie or a clean washcloth.   5. Apply the CHG Soap to your body ONLY FROM THE NECK DOWN.  Do not use on open wounds or open sores. Avoid contact with your eyes, ears, mouth and genitals (private parts). Wash Face and genitals (private parts)  with your normal soap.  6. Wash thoroughly, paying special attention to the area where your surgery will be performed.  7. Thoroughly rinse your body with warm water from the  neck down.  8. DO NOT shower/wash with your normal soap after using and rinsing off the CHG Soap.  9. Pat yourself dry with a CLEAN TOWEL.  10. Wear CLEAN PAJAMAS to bed the night before surgery, wear comfortable clothes the morning of surgery  11. Place CLEAN SHEETS on your bed the night of your first shower and DO NOT SLEEP WITH PETS.  Day of Surgery:  Do not apply any deodorants/lotions.  Please wear clean clothes to the hospital/surgery center.   Remember to brush your teeth WITH YOUR REGULAR TOOTHPASTE.   Please read over the following fact  sheets that you were given.

## 2018-12-17 ENCOUNTER — Encounter (HOSPITAL_COMMUNITY)
Admission: RE | Admit: 2018-12-17 | Discharge: 2018-12-17 | Disposition: A | Discharge status: Home / Self Care | Payer: Medicare Other | Source: Ambulatory Visit | Attending: Neurosurgery | Admitting: Neurosurgery

## 2018-12-17 ENCOUNTER — Encounter (HOSPITAL_COMMUNITY): Payer: Self-pay

## 2018-12-17 ENCOUNTER — Other Ambulatory Visit: Payer: Self-pay

## 2018-12-17 DIAGNOSIS — Z01818 Encounter for other preprocedural examination: Secondary | ICD-10-CM | POA: Diagnosis present

## 2018-12-17 LAB — CBC
HCT: 42.3 % (ref 36.0–46.0)
Hemoglobin: 13.3 g/dL (ref 12.0–15.0)
MCH: 29.6 pg (ref 26.0–34.0)
MCHC: 31.4 g/dL (ref 30.0–36.0)
MCV: 94 fL (ref 80.0–100.0)
Platelets: 185 10*3/uL (ref 150–400)
RBC: 4.5 MIL/uL (ref 3.87–5.11)
RDW: 12.7 % (ref 11.5–15.5)
WBC: 5.2 10*3/uL (ref 4.0–10.5)
nRBC: 0 % (ref 0.0–0.2)

## 2018-12-17 LAB — BASIC METABOLIC PANEL
Anion gap: 10 (ref 5–15)
BUN: 17 mg/dL (ref 8–23)
CO2: 24 mmol/L (ref 22–32)
Calcium: 9.9 mg/dL (ref 8.9–10.3)
Chloride: 105 mmol/L (ref 98–111)
Creatinine, Ser: 0.73 mg/dL (ref 0.44–1.00)
GFR calc Af Amer: >60 mL/min (ref >60)
GFR calc non Af Amer: >60 mL/min (ref >60)
Glucose, Bld: 90 mg/dL (ref 70–99)
Potassium: 4.2 mmol/L (ref 3.5–5.1)
Sodium: 139 mmol/L (ref 135–145)

## 2018-12-17 LAB — TYPE AND SCREEN
ABO/RH(D): A POS
Antibody Screen: NEGATIVE

## 2018-12-17 LAB — SURGICAL PCR SCREEN
MRSA, PCR: NEGATIVE
Staphylococcus aureus: NEGATIVE

## 2018-12-17 NOTE — Progress Notes (Addendum)
PCP - Josetta Huddle, MD Cardiologist - pt denies  Chest x-ray - denies past year EKG - 12/17/2018 in [facility]  Stress Test - 2009 in EP[facility] ECHO - 2009 in [facility]  Cardiac Cath -  Pt denies  Sleep Study - pt denies CPAP - n/a  Fasting Blood Sugar - n/a Checks Blood Sugar _____ times a day-n/a  Blood Thinner Instructions: n/a Aspirin Instructions: n/a  Anesthesia review: pending  Patient denies shortness of breath, fever, cough and chest pain at PAT appointment  Patient verbalized understanding of instructions that were given to them at the PAT appointment. Patient was also instructed that they will need to review over the PAT instructions again at home before surgery.

## 2018-12-23 ENCOUNTER — Encounter (HOSPITAL_COMMUNITY)
Admission: RE | Disposition: A | Discharge status: Home / Self Care | Payer: Self-pay | Source: Home / Self Care | Attending: Neurosurgery

## 2018-12-23 ENCOUNTER — Encounter (HOSPITAL_COMMUNITY): Payer: Self-pay

## 2018-12-23 ENCOUNTER — Ambulatory Visit (HOSPITAL_COMMUNITY): Payer: Medicare Other

## 2018-12-23 ENCOUNTER — Other Ambulatory Visit: Payer: Self-pay

## 2018-12-23 ENCOUNTER — Ambulatory Visit (HOSPITAL_COMMUNITY): Payer: Medicare Other | Admitting: Certified Registered Nurse Anesthetist

## 2018-12-23 ENCOUNTER — Observation Stay (HOSPITAL_COMMUNITY)
Admission: RE | Admit: 2018-12-23 | Discharge: 2018-12-24 | Disposition: A | Discharge status: Home / Self Care | Payer: Medicare Other | Attending: Neurosurgery | Admitting: Neurosurgery

## 2018-12-23 DIAGNOSIS — Z885 Allergy status to narcotic agent status: Secondary | ICD-10-CM | POA: Insufficient documentation

## 2018-12-23 DIAGNOSIS — K219 Gastro-esophageal reflux disease without esophagitis: Secondary | ICD-10-CM | POA: Insufficient documentation

## 2018-12-23 DIAGNOSIS — M5011 Cervical disc disorder with radiculopathy,  high cervical region: Secondary | ICD-10-CM | POA: Diagnosis not present

## 2018-12-23 DIAGNOSIS — M1990 Unspecified osteoarthritis, unspecified site: Secondary | ICD-10-CM | POA: Diagnosis not present

## 2018-12-23 DIAGNOSIS — Z90710 Acquired absence of both cervix and uterus: Secondary | ICD-10-CM | POA: Insufficient documentation

## 2018-12-23 DIAGNOSIS — M50121 Cervical disc disorder at C4-C5 level with radiculopathy: Secondary | ICD-10-CM | POA: Diagnosis present

## 2018-12-23 DIAGNOSIS — Z87891 Personal history of nicotine dependence: Secondary | ICD-10-CM | POA: Diagnosis not present

## 2018-12-23 DIAGNOSIS — Z79899 Other long term (current) drug therapy: Secondary | ICD-10-CM | POA: Insufficient documentation

## 2018-12-23 DIAGNOSIS — I10 Essential (primary) hypertension: Secondary | ICD-10-CM | POA: Diagnosis not present

## 2018-12-23 DIAGNOSIS — Z9049 Acquired absence of other specified parts of digestive tract: Secondary | ICD-10-CM | POA: Insufficient documentation

## 2018-12-23 DIAGNOSIS — Z882 Allergy status to sulfonamides status: Secondary | ICD-10-CM | POA: Diagnosis not present

## 2018-12-23 DIAGNOSIS — F329 Major depressive disorder, single episode, unspecified: Secondary | ICD-10-CM | POA: Insufficient documentation

## 2018-12-23 DIAGNOSIS — M4802 Spinal stenosis, cervical region: Secondary | ICD-10-CM | POA: Diagnosis not present

## 2018-12-23 DIAGNOSIS — Z419 Encounter for procedure for purposes other than remedying health state, unspecified: Secondary | ICD-10-CM

## 2018-12-23 DIAGNOSIS — Z981 Arthrodesis status: Secondary | ICD-10-CM | POA: Insufficient documentation

## 2018-12-23 DIAGNOSIS — Z85820 Personal history of malignant melanoma of skin: Secondary | ICD-10-CM | POA: Diagnosis not present

## 2018-12-23 DIAGNOSIS — Z888 Allergy status to other drugs, medicaments and biological substances status: Secondary | ICD-10-CM | POA: Insufficient documentation

## 2018-12-23 DIAGNOSIS — G959 Disease of spinal cord, unspecified: Secondary | ICD-10-CM | POA: Diagnosis present

## 2018-12-23 HISTORY — PX: ANTERIOR CERVICAL DECOMP/DISCECTOMY FUSION: SHX1161

## 2018-12-23 SURGERY — ANTERIOR CERVICAL DECOMPRESSION/DISCECTOMY FUSION 2 LEVELS
Anesthesia: General | Site: Spine Cervical

## 2018-12-23 MED ORDER — LORATADINE 10 MG PO TABS: 10.0000 mg | ORAL_TABLET | ORAL | Status: DC

## 2018-12-23 MED ORDER — THROMBIN 5000 UNITS EX SOLR
OROMUCOSAL | Status: DC | PRN
  Administered 2018-12-23: 14:00:00

## 2018-12-23 MED ORDER — ONDANSETRON HCL 4 MG/2ML IJ SOLN
INTRAMUSCULAR | Status: AC
  Filled 2018-12-23: qty 4

## 2018-12-23 MED ORDER — ALUM & MAG HYDROXIDE-SIMETH 200-200-20 MG/5ML PO SUSP: 30.0000 mL | Freq: Four times a day (QID) | ORAL | Status: DC | PRN

## 2018-12-23 MED ORDER — FLEET ENEMA 7-19 GM/118ML RE ENEM: 1.0000 | ENEMA | Freq: Once | RECTAL | Status: DC | PRN

## 2018-12-23 MED ORDER — ONDANSETRON HCL 4 MG PO TABS: 4.0000 mg | ORAL_TABLET | Freq: Four times a day (QID) | ORAL | Status: DC | PRN

## 2018-12-23 MED ORDER — DOCUSATE SODIUM 100 MG PO CAPS
100.0000 mg | ORAL_CAPSULE | Freq: Two times a day (BID) | ORAL | Status: DC
  Administered 2018-12-23: 100 mg via ORAL
  Filled 2018-12-23: qty 1

## 2018-12-23 MED ORDER — HEMOSTATIC AGENTS (NO CHARGE) OPTIME
TOPICAL | Status: DC | PRN
  Administered 2018-12-23: 1

## 2018-12-23 MED ORDER — BUPIVACAINE HCL (PF) 0.5 % IJ SOLN
INTRAMUSCULAR | Status: DC | PRN
  Administered 2018-12-23: 4 mL

## 2018-12-23 MED ORDER — FENTANYL CITRATE (PF) 100 MCG/2ML IJ SOLN
INTRAMUSCULAR | Status: AC
  Filled 2018-12-23: qty 2

## 2018-12-23 MED ORDER — SUCCINYLCHOLINE CHLORIDE 200 MG/10ML IV SOSY
PREFILLED_SYRINGE | INTRAVENOUS | Status: DC | PRN
  Administered 2018-12-23: 120 mg via INTRAVENOUS

## 2018-12-23 MED ORDER — PROPOFOL 10 MG/ML IV BOLUS
INTRAVENOUS | Status: DC | PRN
  Administered 2018-12-23: 160 mg via INTRAVENOUS

## 2018-12-23 MED ORDER — MORPHINE SULFATE (PF) 2 MG/ML IV SOLN: 2.0000 mg | INTRAVENOUS | Status: DC | PRN

## 2018-12-23 MED ORDER — ONDANSETRON HCL 4 MG/2ML IJ SOLN: 4.0000 mg | Freq: Four times a day (QID) | INTRAMUSCULAR | Status: DC | PRN

## 2018-12-23 MED ORDER — LACTATED RINGERS IV SOLN
INTRAVENOUS | Status: DC
  Administered 2018-12-23 (×2): via INTRAVENOUS

## 2018-12-23 MED ORDER — OXYCODONE HCL 5 MG PO TABS: 5.0000 mg | ORAL_TABLET | ORAL | Status: DC | PRN

## 2018-12-23 MED ORDER — FENTANYL CITRATE (PF) 250 MCG/5ML IJ SOLN
INTRAMUSCULAR | Status: DC | PRN
  Administered 2018-12-23: 50 ug via INTRAVENOUS
  Administered 2018-12-23: 150 ug via INTRAVENOUS
  Administered 2018-12-23 (×2): 50 ug via INTRAVENOUS

## 2018-12-23 MED ORDER — CEFAZOLIN SODIUM-DEXTROSE 2-4 GM/100ML-% IV SOLN
2.0000 g | INTRAVENOUS | Status: AC
  Administered 2018-12-23: 2 g via INTRAVENOUS

## 2018-12-23 MED ORDER — PANTOPRAZOLE SODIUM 40 MG PO TBEC
40.0000 mg | DELAYED_RELEASE_TABLET | Freq: Every day | ORAL | Status: DC
  Administered 2018-12-23: 40 mg via ORAL
  Filled 2018-12-23: qty 1

## 2018-12-23 MED ORDER — LIDOCAINE-EPINEPHRINE 1 %-1:100000 IJ SOLN
INTRAMUSCULAR | Status: DC | PRN
  Administered 2018-12-23: 4 mL

## 2018-12-23 MED ORDER — DULOXETINE HCL 30 MG PO CPEP: 60.0000 mg | ORAL_CAPSULE | Freq: Every day | ORAL | Status: DC

## 2018-12-23 MED ORDER — LIDOCAINE 2% (20 MG/ML) 5 ML SYRINGE
INTRAMUSCULAR | Status: DC | PRN
  Administered 2018-12-23: 80 mg via INTRAVENOUS

## 2018-12-23 MED ORDER — CEFAZOLIN SODIUM-DEXTROSE 2-4 GM/100ML-% IV SOLN
2.0000 g | Freq: Three times a day (TID) | INTRAVENOUS | Status: AC
  Administered 2018-12-23 – 2018-12-24 (×2): 2 g via INTRAVENOUS
  Filled 2018-12-23 (×2): qty 100

## 2018-12-23 MED ORDER — SODIUM CHLORIDE 0.9 % IV SOLN
INTRAVENOUS | Status: DC | PRN
  Administered 2018-12-23: 15 ug/min via INTRAVENOUS

## 2018-12-23 MED ORDER — LIDOCAINE-EPINEPHRINE 1 %-1:100000 IJ SOLN
INTRAMUSCULAR | Status: AC
  Filled 2018-12-23: qty 1

## 2018-12-23 MED ORDER — SCOPOLAMINE 1 MG/3DAYS TD PT72
MEDICATED_PATCH | TRANSDERMAL | Status: DC | PRN
  Administered 2018-12-23: 1 via TRANSDERMAL

## 2018-12-23 MED ORDER — DEXAMETHASONE SODIUM PHOSPHATE 10 MG/ML IJ SOLN
INTRAMUSCULAR | Status: AC
  Filled 2018-12-23: qty 2

## 2018-12-23 MED ORDER — CEFAZOLIN SODIUM-DEXTROSE 2-4 GM/100ML-% IV SOLN
INTRAVENOUS | Status: AC
  Filled 2018-12-23: qty 100

## 2018-12-23 MED ORDER — SODIUM CHLORIDE 0.9 % IV SOLN: 250.0000 mL | INTRAVENOUS | Status: DC

## 2018-12-23 MED ORDER — CHLORHEXIDINE GLUCONATE CLOTH 2 % EX PADS: 6.0000 | MEDICATED_PAD | Freq: Once | CUTANEOUS | Status: DC

## 2018-12-23 MED ORDER — ACETAMINOPHEN 325 MG PO TABS: 650.0000 mg | ORAL_TABLET | ORAL | Status: DC | PRN

## 2018-12-23 MED ORDER — MIDAZOLAM HCL 2 MG/2ML IJ SOLN
INTRAMUSCULAR | Status: AC
  Filled 2018-12-23: qty 2

## 2018-12-23 MED ORDER — ARTIFICIAL TEARS OPHTHALMIC OINT
TOPICAL_OINTMENT | OPHTHALMIC | Status: AC
  Filled 2018-12-23: qty 3.5

## 2018-12-23 MED ORDER — BUPIVACAINE HCL (PF) 0.5 % IJ SOLN
INTRAMUSCULAR | Status: AC
  Filled 2018-12-23: qty 30

## 2018-12-23 MED ORDER — ONDANSETRON HCL 4 MG/2ML IJ SOLN
INTRAMUSCULAR | Status: DC | PRN
  Administered 2018-12-23: 4 mg via INTRAVENOUS

## 2018-12-23 MED ORDER — ROCURONIUM BROMIDE 10 MG/ML (PF) SYRINGE
PREFILLED_SYRINGE | INTRAVENOUS | Status: DC | PRN
  Administered 2018-12-23: 50 mg via INTRAVENOUS

## 2018-12-23 MED ORDER — BUPROPION HCL ER (XL) 150 MG PO TB24
150.0000 mg | ORAL_TABLET | Freq: Every day | ORAL | Status: DC
  Filled 2018-12-23: qty 1

## 2018-12-23 MED ORDER — HYDROCORTISONE 2.5 % RE CREA: 1.0000 "application " | TOPICAL_CREAM | Freq: Two times a day (BID) | RECTAL | Status: DC

## 2018-12-23 MED ORDER — ACETAMINOPHEN 650 MG RE SUPP: 650.0000 mg | RECTAL | Status: DC | PRN

## 2018-12-23 MED ORDER — 0.9 % SODIUM CHLORIDE (POUR BTL) OPTIME
TOPICAL | Status: DC | PRN
  Administered 2018-12-23: 1000 mL

## 2018-12-23 MED ORDER — FENTANYL CITRATE (PF) 100 MCG/2ML IJ SOLN
25.0000 ug | INTRAMUSCULAR | Status: DC | PRN
  Administered 2018-12-23 (×2): 50 ug via INTRAVENOUS

## 2018-12-23 MED ORDER — ARTHROTEC 75-200 MG-MCG PO TABS: 1.0000 | ORAL_TABLET | Freq: Every morning | ORAL | Status: DC

## 2018-12-23 MED ORDER — SODIUM CHLORIDE 0.9% FLUSH: 3.0000 mL | Freq: Two times a day (BID) | INTRAVENOUS | Status: DC

## 2018-12-23 MED ORDER — KCL IN DEXTROSE-NACL 20-5-0.45 MEQ/L-%-% IV SOLN: INTRAVENOUS | Status: DC

## 2018-12-23 MED ORDER — PSYLLIUM 95 % PO PACK
1.0000 | PACK | Freq: Every day | ORAL | Status: DC
  Filled 2018-12-23: qty 1

## 2018-12-23 MED ORDER — FLUTICASONE PROPIONATE 50 MCG/ACT NA SUSP
2.0000 | NASAL | Status: DC
  Filled 2018-12-23: qty 16

## 2018-12-23 MED ORDER — HYDROCODONE-ACETAMINOPHEN 5-325 MG PO TABS
2.0000 | ORAL_TABLET | ORAL | Status: DC | PRN
  Administered 2018-12-23 – 2018-12-24 (×4): 2 via ORAL
  Filled 2018-12-23 (×4): qty 2

## 2018-12-23 MED ORDER — SCOPOLAMINE 1 MG/3DAYS TD PT72
MEDICATED_PATCH | TRANSDERMAL | Status: AC
  Filled 2018-12-23: qty 1

## 2018-12-23 MED ORDER — FAMOTIDINE 20 MG PO TABS: 20.0000 mg | ORAL_TABLET | Freq: Every day | ORAL | Status: DC | PRN

## 2018-12-23 MED ORDER — ROCURONIUM BROMIDE 50 MG/5ML IV SOSY
PREFILLED_SYRINGE | INTRAVENOUS | Status: AC
  Filled 2018-12-23: qty 5

## 2018-12-23 MED ORDER — SODIUM CHLORIDE 0.9% FLUSH: 3.0000 mL | INTRAVENOUS | Status: DC | PRN

## 2018-12-23 MED ORDER — MIDAZOLAM HCL 2 MG/2ML IJ SOLN
INTRAMUSCULAR | Status: DC | PRN
  Administered 2018-12-23 (×2): 1 mg via INTRAVENOUS

## 2018-12-23 MED ORDER — DIPHENHYDRAMINE HCL 25 MG PO CAPS: 25.0000 mg | ORAL_CAPSULE | Freq: Three times a day (TID) | ORAL | Status: DC | PRN

## 2018-12-23 MED ORDER — FENTANYL CITRATE (PF) 250 MCG/5ML IJ SOLN
INTRAMUSCULAR | Status: AC
  Filled 2018-12-23: qty 5

## 2018-12-23 MED ORDER — DEXAMETHASONE SODIUM PHOSPHATE 10 MG/ML IJ SOLN
INTRAMUSCULAR | Status: DC | PRN
  Administered 2018-12-23: 10 mg via INTRAVENOUS

## 2018-12-23 MED ORDER — ZOLPIDEM TARTRATE 5 MG PO TABS: 5.0000 mg | ORAL_TABLET | Freq: Every evening | ORAL | Status: DC | PRN

## 2018-12-23 MED ORDER — PROPOFOL 10 MG/ML IV BOLUS
INTRAVENOUS | Status: AC
  Filled 2018-12-23: qty 20

## 2018-12-23 MED ORDER — PANTOPRAZOLE SODIUM 40 MG IV SOLR: 40.0000 mg | Freq: Every day | INTRAVENOUS | Status: DC

## 2018-12-23 MED ORDER — TRAMADOL HCL 50 MG PO TABS: 50.0000 mg | ORAL_TABLET | Freq: Four times a day (QID) | ORAL | Status: DC | PRN

## 2018-12-23 MED ORDER — METHOCARBAMOL 1000 MG/10ML IJ SOLN
500.0000 mg | Freq: Four times a day (QID) | INTRAVENOUS | Status: DC | PRN
  Filled 2018-12-23: qty 5

## 2018-12-23 MED ORDER — ESMOLOL HCL 100 MG/10ML IV SOLN
INTRAVENOUS | Status: DC | PRN
  Administered 2018-12-23: 30 mg via INTRAVENOUS

## 2018-12-23 MED ORDER — POLYETHYLENE GLYCOL 3350 17 G PO PACK: 17.0000 g | PACK | Freq: Every day | ORAL | Status: DC | PRN

## 2018-12-23 MED ORDER — ESMOLOL HCL 100 MG/10ML IV SOLN
INTRAVENOUS | Status: AC
  Filled 2018-12-23: qty 10

## 2018-12-23 MED ORDER — METHOCARBAMOL 500 MG PO TABS
500.0000 mg | ORAL_TABLET | Freq: Four times a day (QID) | ORAL | Status: DC | PRN
  Administered 2018-12-23 – 2018-12-24 (×2): 500 mg via ORAL
  Filled 2018-12-23 (×2): qty 1

## 2018-12-23 MED ORDER — SUGAMMADEX SODIUM 200 MG/2ML IV SOLN
INTRAVENOUS | Status: DC | PRN
  Administered 2018-12-23: 200 mg via INTRAVENOUS

## 2018-12-23 MED ORDER — THROMBIN 5000 UNITS EX SOLR
CUTANEOUS | Status: AC
  Filled 2018-12-23: qty 5000

## 2018-12-23 MED ORDER — LIDOCAINE 2% (20 MG/ML) 5 ML SYRINGE
INTRAMUSCULAR | Status: AC
  Filled 2018-12-23: qty 5

## 2018-12-23 MED ORDER — PHENOL 1.4 % MT LIQD
1.0000 | OROMUCOSAL | Status: DC | PRN
  Filled 2018-12-23: qty 177

## 2018-12-23 MED ORDER — MENTHOL 3 MG MT LOZG
1.0000 | LOZENGE | OROMUCOSAL | Status: DC | PRN
  Filled 2018-12-23: qty 9

## 2018-12-23 MED ORDER — BISACODYL 10 MG RE SUPP: 10.0000 mg | Freq: Every day | RECTAL | Status: DC | PRN

## 2018-12-23 MED ORDER — PROBIOTIC PO CAPS: 1.0000 | ORAL_CAPSULE | Freq: Every day | ORAL | Status: DC

## 2018-12-23 SURGICAL SUPPLY — 71 items
BASKET BONE COLLECTION (BASKET) IMPLANT
BIT DRILL 12X2.5XAVTR (BIT) ×1 IMPLANT
BIT DRILL AVIATOR 12 (BIT) ×1
BIT DRILL NEURO 2X3.1 SFT TUCH (MISCELLANEOUS) ×1 IMPLANT
BIT DRL 12X2.5XAVTR (BIT) ×1
BLADE SURG 15 STRL LF DISP TIS (BLADE) ×1 IMPLANT
BLADE SURG 15 STRL SS (BLADE) ×1
BLADE ULTRA TIP 2M (BLADE) IMPLANT
BNDG GAUZE ELAST 4 BULKY (GAUZE/BANDAGES/DRESSINGS) IMPLANT
BUR BARREL STRAIGHT FLUTE 4.0 (BURR) ×2 IMPLANT
CANISTER SUCT 3000ML PPV (MISCELLANEOUS) ×2 IMPLANT
CARTRIDGE OIL MAESTRO DRILL (MISCELLANEOUS) ×1 IMPLANT
COVER MAYO STAND STRL (DRAPES) ×2 IMPLANT
COVER WAND RF STERILE (DRAPES) ×2 IMPLANT
DECANTER SPIKE VIAL GLASS SM (MISCELLANEOUS) ×2 IMPLANT
DERMABOND ADVANCED (GAUZE/BANDAGES/DRESSINGS) ×1
DERMABOND ADVANCED .7 DNX12 (GAUZE/BANDAGES/DRESSINGS) ×1 IMPLANT
DIFFUSER DRILL AIR PNEUMATIC (MISCELLANEOUS) ×2 IMPLANT
DRAIN JACKSON PRATT 10MM FLAT (MISCELLANEOUS) ×2 IMPLANT
DRAPE HALF SHEET 40X57 (DRAPES) IMPLANT
DRAPE LAPAROTOMY 100X72 PEDS (DRAPES) ×2 IMPLANT
DRAPE MICROSCOPE LEICA (MISCELLANEOUS) ×2 IMPLANT
DRILL NEURO 2X3.1 SOFT TOUCH (MISCELLANEOUS) ×2
DRSG OPSITE POSTOP 4X6 (GAUZE/BANDAGES/DRESSINGS) ×2 IMPLANT
DURAPREP 6ML APPLICATOR 50/CS (WOUND CARE) ×2 IMPLANT
ELECT COATED BLADE 2.86 ST (ELECTRODE) ×2 IMPLANT
ELECT REM PT RETURN 9FT ADLT (ELECTROSURGICAL) ×2
ELECTRODE REM PT RTRN 9FT ADLT (ELECTROSURGICAL) ×1 IMPLANT
EVACUATOR SILICONE 100CC (DRAIN) ×2 IMPLANT
GAUZE 4X4 16PLY RFD (DISPOSABLE) IMPLANT
GAUZE SPONGE 4X4 12PLY STRL (GAUZE/BANDAGES/DRESSINGS) IMPLANT
GLOVE BIO SURGEON STRL SZ8 (GLOVE) IMPLANT
GLOVE BIOGEL PI IND STRL 8 (GLOVE) ×1 IMPLANT
GLOVE BIOGEL PI IND STRL 8.5 (GLOVE) ×1 IMPLANT
GLOVE BIOGEL PI INDICATOR 8 (GLOVE) ×1
GLOVE BIOGEL PI INDICATOR 8.5 (GLOVE) ×1
GLOVE ECLIPSE 8.0 STRL XLNG CF (GLOVE) IMPLANT
GLOVE SURG SS PI 7.5 STRL IVOR (GLOVE) ×2 IMPLANT
GLOVE SURG SS PI 8.0 STRL IVOR (GLOVE) ×2 IMPLANT
GLOVE SURG SS PI 8.5 STRL IVOR (GLOVE) ×1
GLOVE SURG SS PI 8.5 STRL STRW (GLOVE) ×1 IMPLANT
GOWN STRL REUS W/ TWL LRG LVL3 (GOWN DISPOSABLE) IMPLANT
GOWN STRL REUS W/ TWL XL LVL3 (GOWN DISPOSABLE) ×2 IMPLANT
GOWN STRL REUS W/TWL 2XL LVL3 (GOWN DISPOSABLE) ×2 IMPLANT
GOWN STRL REUS W/TWL LRG LVL3 (GOWN DISPOSABLE)
GOWN STRL REUS W/TWL XL LVL3 (GOWN DISPOSABLE) ×2
HALTER HD/CHIN CERV TRACTION D (MISCELLANEOUS) ×2 IMPLANT
HEMOSTAT POWDER KIT SURGIFOAM (HEMOSTASIS) ×2 IMPLANT
KIT BASIN OR (CUSTOM PROCEDURE TRAY) ×2 IMPLANT
KIT TURNOVER KIT B (KITS) ×2 IMPLANT
NEEDLE HYPO 25X1 1.5 SAFETY (NEEDLE) ×2 IMPLANT
NEEDLE SPNL 22GX3.5 QUINCKE BK (NEEDLE) ×4 IMPLANT
NS IRRIG 1000ML POUR BTL (IV SOLUTION) ×2 IMPLANT
OIL CARTRIDGE MAESTRO DRILL (MISCELLANEOUS) ×2
PACK LAMINECTOMY NEURO (CUSTOM PROCEDURE TRAY) ×2 IMPLANT
PAD ARMBOARD 7.5X6 YLW CONV (MISCELLANEOUS) ×2 IMPLANT
PIN DISTRACTION 14MM (PIN) ×6 IMPLANT
PLATE AVIATOR ASSY 2LVL SZ 30 (Plate) ×2 IMPLANT
PUTTY DBX 1CC (Putty) ×2 IMPLANT
PUTTY DBX 1CC DEPUY (Putty) ×1 IMPLANT
RUBBERBAND STERILE (MISCELLANEOUS) ×4 IMPLANT
SCREW AVIAT VAR SLFTAP 4.35X12 (Screw) ×4 IMPLANT
SCREW AVIATOR VAR SELFTAP 4X12 (Screw) ×8 IMPLANT
SPACER CERV AVS 14X16X5MM 4D (Spacer) ×4 IMPLANT
SPONGE INTESTINAL PEANUT (DISPOSABLE) ×2 IMPLANT
SPONGE SURGIFOAM ABS GEL SZ50 (HEMOSTASIS) IMPLANT
STAPLER SKIN PROX WIDE 3.9 (STAPLE) IMPLANT
SUT VIC AB 3-0 SH 8-18 (SUTURE) ×4 IMPLANT
TOWEL GREEN STERILE (TOWEL DISPOSABLE) ×2 IMPLANT
TOWEL GREEN STERILE FF (TOWEL DISPOSABLE) ×2 IMPLANT
WATER STERILE IRR 1000ML POUR (IV SOLUTION) ×2 IMPLANT

## 2018-12-23 NOTE — Anesthesia Procedure Notes (Signed)
Procedure Name: Intubation Date/Time: 12/23/2018 1:59 PM Performed by: Clearnce Sorrel, CRNA Pre-anesthesia Checklist: Patient identified, Emergency Drugs available, Suction available, Patient being monitored and Timeout performed Patient Re-evaluated:Patient Re-evaluated prior to induction Oxygen Delivery Method: Circle system utilized Preoxygenation: Pre-oxygenation with 100% oxygen Induction Type: IV induction and Rapid sequence Laryngoscope Size: 3 and Glidescope (limited neck ROM) Grade View: Grade I Tube type: Oral Tube size: 7.0 mm Number of attempts: 1 Airway Equipment and Method: Stylet Placement Confirmation: ETT inserted through vocal cords under direct vision,  positive ETCO2 and breath sounds checked- equal and bilateral Secured at: 22 cm Tube secured with: Tape Dental Injury: Teeth and Oropharynx as per pre-operative assessment

## 2018-12-23 NOTE — H&P (Signed)
Patient ID:   [id number] Patient: Michelle Martinez  Date of Birth: 1951/04/11 Visit Type: Office Visit   Date: 11/24/2018 03:30 PM Provider: Marchia Meiers. Vertell Limber MD   This 68 year old female presents for neck and arm pain.  HISTORY OF PRESENT ILLNESS: 1.  neck and arm pain  Michelle Martinez, 68 year old retired female, returns for evaluation.  Initially, she sought ortho evaluation believing to have carpal tunnel symptoms with burning in her elbow and wrist.  Workup prompted a cervical MRI and neurosurgery referral.  Currently, she reports no position of comfort, poor sleep, with neck and bilateral arm pain.  She also notes dragging her right foot recently.  Arthrotec daily  History:  Melanoma leg and knows, depression, arthritis Surgical history:  Colectomy 2019 (benign polyp), lumpectomy 2019, C-section x2, hysterectomy, ACDF C5-6 2004, L4-5 PLIF 2014, microdiskectomy 2008 L3-4  Cervical MRI on [place]  Cervical MRI demonstrates well-healed cervical decompression and fusion at the C5-6 level with significant disc herniation at C4-5 and C3-4 levels with significant cord compression, C3-4 greater than C4-5 levels.  The remaining levels of the cervical spine appear unremarkable.  The patient is complaining of significant neck pain as well as bilateral upper extremity pain.  She also notes that she is dragging her right foot when she walks.  She says that initially started on the left but is now gone to the right and is affecting both sides fairly significantly.  She describes neck and arm pain at 8/10 in severity       PAST MEDICAL/SURGICAL HISTORY:   (Detailed)   Disease/disorder Onset Date Management Date Comments   Surgery, lumbar spine     Surgery, cervical spine   Cancer, skin     Depression     Hypertension        PAST MEDICAL HISTORY, SURGICAL HISTORY, FAMILY HISTORY, SOCIAL HISTORY AND REVIEW OF SYSTEMS I have reviewed the patient's past  medical, surgical, family and social history as well as the comprehensive review of systems as included on the [facility] history form dated 11/24/2018, which I have signed.  Family History:  (Detailed) Relationship Family Member Name Deceased Age at Death Condition Onset Age Cause of Death Father    Dementia  N    Social History:  (Detailed) Tobacco use reviewed. Preferred language is [place].   Tobacco use status: Never smoked tobacco. Smoking status: Never smoker.  SMOKING STATUS Type Smoking Status Usage Per Day Years Used Total Pack Years  Never smoker     TOBACCO/VAPING EXPOSURE No passive vaping exposure. No passive smoke exposure.       MEDICATIONS: (added, continued or stopped this visit) Started Medication Directions Instruction Stopped  Arthrotec 75 75 mg-200 mcg tablet,film-coated take 1 tablet by oral route  every day    atenolol 25 mg tablet take 1 tablet by oral route  every day   09/08/2013 DIAZEPAM 5 MG TABLET Take 1 tablet by mouth every 8 hours as needed for spasms    duloxetine 60 mg capsule,delayed release take 1 capsule by oral route  every day    Lyrica 50 mg capsule take 1 capsule by oral route  every day   05/12/2013 METHOCARBAMOL 500 MG TABLET take 1 tablet by mouth three times a day if needed      ALLERGIES: Ingredient Reaction Medication Name [place] (SULFONAMIDE ANTIBIOTICS)     Reviewed, updated.   REVIEW OF SYSTEMS  See scanned  patient registration form, dated 11/24/2018, signed and dated on 11/25/2018  Review of Systems Details System Neg/Pos Details Constitutional Negative Chills, Fatigue, Fever, Malaise, Night sweats, Weight gain and Weight loss. ENMT Negative Ear drainage, Hearing loss, Nasal drainage, Otalgia, Sinus pressure and Sore throat. Eyes Negative Eye discharge, Eye pain and Vision  changes. Respiratory Negative Chronic cough, Cough, Dyspnea, Known TB exposure and Wheezing. Cardio Negative Chest pain, Claudication, Edema and Irregular heartbeat/palpitations. GI Negative Abdominal pain, Blood in stool, Change in stool pattern, Constipation, Decreased appetite, Diarrhea, Heartburn, Nausea and Vomiting. GU Negative Dysuria, Hematuria, Polyuria (Genitourinary), Urinary frequency, Urinary incontinence and Urinary retention. Endocrine Negative Cold intolerance, Heat intolerance, Polydipsia and Polyphagia. Neuro Negative Dizziness, Extremity weakness, Gait disturbance, Headache, Memory impairment, Numbness in extremity, Seizures and Tremors. Psych Negative Anxiety, Depression and Insomnia. Integumentary Negative Brittle hair, Brittle nails, Change in shape/size of mole(s), Hair loss, Hirsutism, Hives, Pruritus, Rash and Skin lesion. MS Positive Neck pain. Hema/Lymph Negative Easy bleeding, Easy bruising and Lymphadenopathy. Allergic/Immuno Negative Contact allergy, Environmental allergies, Food allergies and Seasonal allergies. Reproductive Negative Breast discharge, Breast lumps, Dysmenorrhea, Dyspareunia, History of abnormal PAP smear, Hot flashes, Irregular menses and Vaginal discharge.  PHYSICAL EXAM:  Vitals Date Temp F BP Pulse Ht In Wt Lb BMI BSA Pain Score 11/24/2018  120/75 94 63 127 22.5  8/10   PHYSICAL EXAM Details General Level of Distress: no acute distress Overall Appearance: normal  Head and Face  Right Left  Fundoscopic Exam:  normal normal    Cardiovascular Cardiac: regular rate and rhythm without murmur  Right Left  Carotid Pulses: normal normal  Respiratory Lungs: clear to auscultation  Neurological Orientation: normal Recent and Remote Memory: normal Attention Span and Concentration:   normal Language: normal Fund of Knowledge: normal  Right Left Sensation: normal normal Upper Extremity  Coordination: normal normal  Lower Extremity Coordination: normal normal  Musculoskeletal Gait and Station: normal  Right Left Upper Extremity Muscle Strength: normal normal Lower Extremity Muscle Strength: normal normal Upper Extremity Muscle Tone:  normal normal Lower Extremity Muscle Tone: normal normal   Motor Strength Upper and lower extremity motor strength was tested in the clinically pertinent muscles. Any abnormal findings will be noted below.   Right Left Grip: 4/5 4/5 Finger Extensor: 4/5 4/5   Deep Tendon Reflexes  Right Left Biceps: increase increase Triceps: increase increase Brachioradialis: increase increase Patellar: increase increase Achilles: increase increase  Sensory Sensation was tested at C2 to T1.   Cranial Nerves II. Optic Nerve/Visual Fields: normal III. Oculomotor: normal IV. Trochlear: normal V. Trigeminal: normal VI. Abducens: normal VII. Facial: normal VIII. Acoustic/Vestibular: normal IX. Glossopharyngeal: normal X. Vagus: normal XI. Spinal Accessory: normal XII. 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  Patient has bilateral upper extremity weakness in hand intrinsics, bilaterally positive Spurling maneuver and hyperreflexia with positive Hoffmann signs.  PLAN: I have recommended to the patient that she undergo anterior cervical decompression and fusion C3-4 and C4-5 levels.  Risks and  benefits were discussed in detail with the patient she wished to  proceed with surgery.  She was fitted for a soft cervical collar.  Orders: Diagnostic Procedures: Assessment Procedure M54.12 Cervical Spine- AP/Lat M54.12 Cervical Spine- Lateral Miscellaneous: Assessment  M54.12 Soft Cer Collar Uni 3 inch  (L  Completed Orders (this encounter) Order Details Reason Side Interpretation Result Initial Treatment Date Region Cervical Spine- AP/Lat      11/24/2018 All Levels to All Levels  Assessment/Plan  # Detail Type Description  1. Assessment Cervical myelopathy (G95.9).     2. Assessment Cervical stenosis of spinal canal (M48.02).     3. Assessment Cervical radiculopathy (M54.12).  Plan Orders Soft Cer Collar Uni 3 inch  (L.     4. Assessment Herniated nucleus pulposus, cervical (M50.20).       Pain Management Plan Pain Scale: 8/10. Method: Numeric Pain Intensity Scale. Location: neck and arm. Onset: 09/23/2018. Duration: varies. Quality: discomforting. Pain management follow-up plan of care: Patient is taking OTC pain relieves for relief.              Provider:  Marchia Meiers. Vertell Limber MD  11/27/2018 03:29 PM Dictation edited by: Marchia Meiers. Vertell Limber    CC Providers: [facility] at [address],  [place]  [id number]     [place]  [address] [place], [place] [address]               Electronically signed by Marchia Meiers. Vertell Limber MD on 11/27/2018 03:29 PM

## 2018-12-23 NOTE — Anesthesia Preprocedure Evaluation (Signed)
Anesthesia Evaluation  Patient identified by MRN, date of birth, ID band Patient awake    Reviewed: Allergy & Precautions, NPO status , Patient's Chart, lab work & pertinent test results  Airway Mallampati: II  TM Distance: >3 FB     Dental   Pulmonary former smoker,    breath sounds clear to auscultation       Cardiovascular + dysrhythmias  Rhythm:Regular Rate:Normal     Neuro/Psych    GI/Hepatic Neg liver ROS, GERD  ,  Endo/Other  negative endocrine ROS  Renal/GU negative Renal ROS     Musculoskeletal   Abdominal   Peds  Hematology   Anesthesia Other Findings   Reproductive/Obstetrics                             Anesthesia Physical Anesthesia Plan  ASA: III  Anesthesia Plan: General   Post-op Pain Management:    Induction: Intravenous  PONV Risk Score and Plan: Ondansetron and Dexamethasone  Airway Management Planned: Oral ETT  Additional Equipment:   Intra-op Plan:   Post-operative Plan: Possible Post-op intubation/ventilation  Informed Consent: I have reviewed the patients History and Physical, chart, labs and discussed the procedure including the risks, benefits and alternatives for the proposed anesthesia with the patient or authorized representative who has indicated his/her understanding and acceptance.     Dental advisory given  Plan Discussed with: CRNA  Anesthesia Plan Comments:         Anesthesia Quick Evaluation

## 2018-12-23 NOTE — Op Note (Signed)
12/23/2018  4:33 PM  PATIENT:  Coolidge Breeze  68 y.o. female  PRE-OPERATIVE DIAGNOSIS:  Herniated nucleus pulposus, Cervical, cervical stenosis, cervical myelopathy, cervical radiculopathy, cervicalgia C 34 and C 45 levels  POST-OPERATIVE DIAGNOSIS:  Herniated nucleus pulposus, Cervical, cervical stenosis, cervical myelopathy, cervical radiculopathy, cervicalgia C 34 and C 45 levels  PROCEDURE:  Procedure(s) with comments: Cervical 3-4 Cervical 4-5 Anterior cervical decompression/discectomy/fusion (N/A) - Cervical 3-4 Cervical 4-5 Anterior cervical decompression/discectomy/fusion  With PEEK cages, autograft, plate with exploration of fusion C 56 and removal of hardware   SURGEON:  Surgeon(s) and Role:    Erline Levine, MD - Primary  PHYSICIAN ASSISTANT:   ASSISTANTS: Poteat, RN   ANESTHESIA:   general  EBL:  100 mL   BLOOD ADMINISTERED:none  DRAINS: (10) Jackson-Pratt drain(s) with closed bulb suction in the prevertebral space   LOCAL MEDICATIONS USED:  MARCAINE    and LIDOCAINE   SPECIMEN:  No Specimen  DISPOSITION OF SPECIMEN:  N/A  COUNTS:  YES  TOURNIQUET:  * No tourniquets in log *  DICTATION: Patient is 68 year old female with bilateral arm pain and weakness with HNP, spondylosis, radiculopathy C 34 and C 45.  Patient had C 56 ACDF in the past.  Patient also underwent exploration of fusion C 56.  PROCEDURE: Patient was brought to operating room and following the smooth and uncomplicated induction of general endotracheal anesthesia her head was placed on a horseshoe head holder she was placed in 5 pounds of Holter traction and her anterior neck was prepped and draped in usual sterile fashion. Prior incision was made on the left side of midline after infiltrating the skin and subcutaneous tissues with local lidocaine. The platysmal layer was incised and subplatysmal dissection was performed exposing the anterior border sternocleidomastoid muscle. Using blunt  dissection the carotid sheath was kept lateral and trachea and esophagus kept medial exposing the anterior cervical spine.  The previously placed plate was cleared of investing soft tissue.   A bent spinal needle was placed it was felt to be the C 45 and C 34 levels and this was confirmed on intraoperative x-ray. Longus coli muscles were taken down from the anterior cervical spine using electrocautery and key elevator and self-retaining retractor was placed exposing the C 34 and C 45 levels. The previously placed plate and screws were removed.  Hemostasis was assured.  The bony interfaces were carefully inspected and there appeared to be solid arthrodesis at the previously operated C 56 level.  The interspaces were incised and a thorough discectomy was performed. Distraction pins were placed. Initially the C 34 level was operated. Uncinate spurs and central spondylitic ridges were drilled down with a high-speed drill. The spinal cord dura and both C 4 nerve roots were widely decompressed. Hemostasis was assured. After trial sizing a 5 mm lordotic large footprint peek interbody cage was selected and packed with autograft and DBM. This was tamped into position and countersunk appropriately. Attention was the paid to the C 45 level, where similar decompression was performed.  Uncinate spurs and central spondylitic ridges were drilled down with a high-speed drill. The spinal cord dura and both C6 nerve roots were widely decompressed. Hemostasis was assured. After trial sizing a 5 mm peek interbody cage was selected and packed in a similar fashion. This was tamped into position and countersunk appropriately.Distraction weight was removed. A 30 mm Aviator anterior cervical plate was affixed to the cervical spine with 12 mm variable-angle screws 2 at  C 3, 2 at C 4 and 2 at C 5. 4.35 mm screws were used at C 5 level.  All screws were well-positioned and locking mechanisms were engaged. A final X ray was obtained which  showed well positioned grafts and anterior plate without complicating features. Soft tissues were inspected and found to be in good repair. The wound was irrigated. A #10 JP drain was inserted through a separate stab incision.  The platysma layer was closed with 3-0 Vicryl stitches and the skin was reapproximated with 3-0 Vicryl subcuticular stitches. The wound was dressed with Dermabond. Counts were correct at the end of the case. Patient was extubated and taken to recovery in stable and satisfactory condition.    PLAN OF CARE: Admit for overnight observation  PATIENT DISPOSITION:  PACU - hemodynamically stable.   Delay start of Pharmacological VTE agent (>24hrs) due to surgical blood loss or risk of bleeding: yes

## 2018-12-23 NOTE — Transfer of Care (Signed)
Immediate Anesthesia Transfer of Care Note  Patient: Michelle Martinez  Procedure(s) Performed: Cervical 3-4 Cervical 4-5 Anterior cervical decompression/discectomy/fusion (N/A Spine Cervical)  Patient Location: PACU  Anesthesia Type:General  Level of Consciousness: drowsy  Airway & Oxygen Therapy: Patient Spontanous Breathing and Patient connected to nasal cannula oxygen  Post-op Assessment: Report given to RN and Post -op Vital signs reviewed and stable  Post vital signs: Reviewed and stable  Last Vitals:  Vitals Value Taken Time  BP 140/95 12/23/2018  4:15 PM  Temp    Pulse 103 12/23/2018  4:15 PM  Resp 21 12/23/2018  4:15 PM  SpO2 95 % 12/23/2018  4:15 PM    Last Pain:  Vitals:   12/23/18 1235  TempSrc: Oral  PainSc:       Patients Stated Pain Goal: 3 (14/78/29 5621)  Complications: No apparent anesthesia complications

## 2018-12-23 NOTE — Brief Op Note (Signed)
12/23/2018  4:33 PM  PATIENT:  Michelle Martinez  68 y.o. female  PRE-OPERATIVE DIAGNOSIS:  Herniated nucleus pulposus, Cervical, cervical stenosis, cervical myelopathy, cervical radiculopathy, cervicalgia C 34 and C 45 levels  POST-OPERATIVE DIAGNOSIS:  Herniated nucleus pulposus, Cervical, cervical stenosis, cervical myelopathy, cervical radiculopathy, cervicalgia C 34 and C 45 levels  PROCEDURE:  Procedure(s) with comments: Cervical 3-4 Cervical 4-5 Anterior cervical decompression/discectomy/fusion (N/A) - Cervical 3-4 Cervical 4-5 Anterior cervical decompression/discectomy/fusion  With PEEK cages, autograft, plate with exploration of fusion C 56 and removal of hardware   SURGEON:  Surgeon(s) and Role:    Erline Levine, MD - Primary  PHYSICIAN ASSISTANT:   ASSISTANTS: Poteat, RN   ANESTHESIA:   general  EBL:  100 mL   BLOOD ADMINISTERED:none  DRAINS: (10) Jackson-Pratt drain(s) with closed bulb suction in the prevertebral space   LOCAL MEDICATIONS USED:  MARCAINE    and LIDOCAINE   SPECIMEN:  No Specimen  DISPOSITION OF SPECIMEN:  N/A  COUNTS:  YES  TOURNIQUET:  * No tourniquets in log *  DICTATION: Patient is 68 year old female with bilateral arm pain and weakness with HNP, spondylosis, radiculopathy C 34 and C 45.  Patient had C 56 ACDF in the past.  Patient also underwent exploration of fusion C 56.  PROCEDURE: Patient was brought to operating room and following the smooth and uncomplicated induction of general endotracheal anesthesia her head was placed on a horseshoe head holder she was placed in 5 pounds of Holter traction and her anterior neck was prepped and draped in usual sterile fashion. Prior incision was made on the left side of midline after infiltrating the skin and subcutaneous tissues with local lidocaine. The platysmal layer was incised and subplatysmal dissection was performed exposing the anterior border sternocleidomastoid muscle. Using blunt  dissection the carotid sheath was kept lateral and trachea and esophagus kept medial exposing the anterior cervical spine.  The previously placed plate was cleared of investing soft tissue.   A bent spinal needle was placed it was felt to be the C 45 and C 34 levels and this was confirmed on intraoperative x-ray. Longus coli muscles were taken down from the anterior cervical spine using electrocautery and key elevator and self-retaining retractor was placed exposing the C 34 and C 45 levels. The previously placed plate and screws were removed.  Hemostasis was assured.  The bony interfaces were carefully inspected and there appeared to be solid arthrodesis at the previously operated C 56 level.  The interspaces were incised and a thorough discectomy was performed. Distraction pins were placed. Initially the C 34 level was operated. Uncinate spurs and central spondylitic ridges were drilled down with a high-speed drill. The spinal cord dura and both C 4 nerve roots were widely decompressed. Hemostasis was assured. After trial sizing a 5 mm lordotic large footprint peek interbody cage was selected and packed with autograft and DBM. This was tamped into position and countersunk appropriately. Attention was the paid to the C 45 level, where similar decompression was performed.  Uncinate spurs and central spondylitic ridges were drilled down with a high-speed drill. The spinal cord dura and both C6 nerve roots were widely decompressed. Hemostasis was assured. After trial sizing a 5 mm peek interbody cage was selected and packed in a similar fashion. This was tamped into position and countersunk appropriately.Distraction weight was removed. A 30 mm Aviator anterior cervical plate was affixed to the cervical spine with 12 mm variable-angle screws 2 at  C 3, 2 at C 4 and 2 at C 5. 4.35 mm screws were used at C 5 level.  All screws were well-positioned and locking mechanisms were engaged. A final X ray was obtained which  showed well positioned grafts and anterior plate without complicating features. Soft tissues were inspected and found to be in good repair. The wound was irrigated. A #10 JP drain was inserted through a separate stab incision.  The platysma layer was closed with 3-0 Vicryl stitches and the skin was reapproximated with 3-0 Vicryl subcuticular stitches. The wound was dressed with Dermabond. Counts were correct at the end of the case. Patient was extubated and taken to recovery in stable and satisfactory condition.    PLAN OF CARE: Admit for overnight observation  PATIENT DISPOSITION:  PACU - hemodynamically stable.   Delay start of Pharmacological VTE agent (>24hrs) due to surgical blood loss or risk of bleeding: yes

## 2018-12-23 NOTE — Interval H&P Note (Signed)
History and Physical Interval Note:  12/23/2018 12:20 PM  Michelle Martinez  has presented today for surgery, with the diagnosis of Herniated nucleus pulposus, Cervical  The various methods of treatment have been discussed with the patient and family. After consideration of risks, benefits and other options for treatment, the patient has consented to  Procedure(s) with comments: Cervical 3-4 Cervical 4-5 Anterior cervical decompression/discectomy/fusion (N/A) - Cervical 3-4 Cervical 4-5 Anterior cervical decompression/discectomy/fusion as a surgical intervention .  The patient's history has been reviewed, patient examined, no change in status, stable for surgery.  I have reviewed the patient's chart and labs.  Questions were answered to the patient's satisfaction.     Peggyann Shoals

## 2018-12-23 NOTE — Anesthesia Postprocedure Evaluation (Signed)
Anesthesia Post Note  Patient: Michelle Martinez  Procedure(s) Performed: Cervical 3-4 Cervical 4-5 Anterior cervical decompression/discectomy/fusion (N/A Spine Cervical)     Patient location during evaluation: PACU Anesthesia Type: General Level of consciousness: awake Pain management: pain level controlled Vital Signs Assessment: post-procedure vital signs reviewed and stable Respiratory status: spontaneous breathing Cardiovascular status: stable Postop Assessment: no apparent nausea or vomiting Anesthetic complications: no    Last Vitals:  Vitals:   12/23/18 1235 12/23/18 1615  BP: (!) 149/88 (!) 140/95  Pulse: 82 (!) 103  Resp: 18 (!) 21  Temp: 36.7 C (!) 36.3 C  SpO2: 99% 95%    Last Pain:  Vitals:   12/23/18 1615  TempSrc:   PainSc: Asleep                 CHARLENE GREEN

## 2018-12-23 NOTE — Progress Notes (Signed)
Patient is awake, alert, conversant.  Some soreness between shoulders.  Full strength bilateral D/B/T/HI.  MAEW with full power.  Hand numbness is improved.  Patient is doing well.

## 2018-12-24 ENCOUNTER — Encounter (HOSPITAL_COMMUNITY): Payer: Self-pay | Admitting: Neurosurgery

## 2018-12-24 DIAGNOSIS — M5011 Cervical disc disorder with radiculopathy,  high cervical region: Secondary | ICD-10-CM | POA: Diagnosis not present

## 2018-12-24 MED ORDER — METHOCARBAMOL 500 MG PO TABS: 500.0000 mg | ORAL_TABLET | Freq: Four times a day (QID) | ORAL | 1 refills | Status: DC | PRN

## 2018-12-24 MED ORDER — HYDROCODONE-ACETAMINOPHEN 5-325 MG PO TABS: 2.0000 | ORAL_TABLET | ORAL | 0 refills | Status: DC | PRN

## 2018-12-24 NOTE — Evaluation (Signed)
Physical Therapy Evaluation & Discharge Patient Details Name: Michelle Martinez MRN: [id number] DOB: [birth year] Today's Date: 12/24/2018   History of Present Illness  Pt is a 68 y.o. female s/p C3-5 cervical/decompression/discectomy/fusion. PMH includes syncope, HTN, osteoporosis, depression, anxiety; sx history includes ACDF C5-6 (2004), L4-5 PLIF (2014), L3-4 microdisectomy (2008).    Clinical Impression  Patient evaluated by Physical Therapy with no further acute PT needs identified. PTA, pt indep and lives alone; has supportive children available. Today, pt indep with mobility and ADLs. Educ on brace wear, precautions, positioning and importance of mobility. All education has been completed and the patient has no further questions. Acute PT is signing off. Thank you for this referral.    Follow Up Recommendations No PT follow up    Equipment Recommendations  None recommended by PT    Recommendations for Other Services       Precautions / Restrictions Precautions Precautions: Fall Required Braces or Orthoses: Cervical Brace Cervical Brace: Soft collar Restrictions Weight Bearing Restrictions: No      Mobility  Bed Mobility Overal bed mobility: Independent                Transfers Overall transfer level: Independent                  Ambulation/Gait Ambulation/Gait assistance: Independent Gait Distance (Feet): 450 Feet Assistive device: None Gait Pattern/deviations: Step-through pattern;Decreased stride length        Stairs Stairs: Yes Stairs assistance: Modified independent (Device/Increase time) Stair Management: One rail Right;Alternating pattern;Forwards Number of Stairs: 10    Wheelchair Mobility    Modified Rankin (Stroke Patients Only)       Balance Overall balance assessment: Needs assistance   Sitting balance-Leahy Scale: Normal Sitting balance - Comments: Indep to reach bilateral feet by bringing to knee     Standing  balance-Leahy Scale: Good               High level balance activites: Side stepping;Backward walking;Direction changes;Turns;Sudden stops High Level Balance Comments: No overt instablity or LOB with higher level balance              Pertinent Vitals/Pain Pain Assessment: Faces Faces Pain Scale: Hurts a little bit Pain Location: Cervical incision Pain Descriptors / Indicators: Sore;Guarding Pain Intervention(s): Limited activity within patient's tolerance    Home Living Family/patient expects to be discharged to:: Private residence Living Arrangements: Alone Available Help at Discharge: Family;Available 24 hours/day(Children) Type of Home: House Home Access: Stairs to enter   Entrance Stairs-Number of Steps: 1 Home Layout: Two level;Able to live on main level with bedroom/bathroom Home Equipment: Shower seat      Prior Function Level of Independence: Independent               Hand Dominance        Extremity/Trunk Assessment   Upper Extremity Assessment Upper Extremity Assessment: Overall WFL for tasks assessed(reports some residual numbness in fingertips)    Lower Extremity Assessment Lower Extremity Assessment: Overall WFL for tasks assessed       Communication   Communication: No difficulties  Cognition Arousal/Alertness: Awake/alert Behavior During Therapy: WFL for tasks assessed/performed Overall Cognitive Status: Within Functional Limits for tasks assessed                                        General Comments General comments (skin integrity, edema, etc.): Son  present and supportive    Exercises     Assessment/Plan    PT Assessment Patent does not need any further PT services  PT Problem List         PT Treatment Interventions      PT Goals (Current goals can be found in the Care Plan section)  Acute Rehab PT Goals PT Goal Formulation: All assessment and education complete, DC therapy    Frequency      Barriers to discharge        Co-evaluation               AM-PAC PT "6 Clicks" Mobility  Outcome Measure Help needed turning from your back to your side while in a flat bed without using bedrails?: None Help needed moving from lying on your back to sitting on the side of a flat bed without using bedrails?: None Help needed moving to and from a bed to a chair (including a wheelchair)?: None Help needed standing up from a chair using your arms (e.g., wheelchair or bedside chair)?: None Help needed to walk in hospital room?: None Help needed climbing 3-5 steps with a railing? : None 6 Click Score: 24    End of Session Equipment Utilized During Treatment: Cervical collar Activity Tolerance: Patient tolerated treatment well Patient left: with call bell/phone within reach;with family/visitor present Nurse Communication: Mobility status PT Visit Diagnosis: Other abnormalities of gait and mobility (R26.89)    Time: 7505-1833 PT Time Calculation (min) (ACUTE ONLY): 15 min   Charges:   PT Evaluation $PT Eval Low Complexity: [place], PT, DPT Acute Rehabilitation Services  Pager [phone] Office [place] 12/24/2018, 8:52 AM

## 2018-12-24 NOTE — Progress Notes (Addendum)
Subjective: Patient reports "I feel good, just my throat is irritated"  Objective: Vital signs in last 24 hours: Temp:  [97.3 F (36.3 C)-98.5 F (36.9 C)] 98.2 F (36.8 C) (03/06 0718) Pulse Rate:  [75-103] 77 (03/06 0718) Resp:  [16-21] 18 (03/06 0718) BP: (107-149)/(63-95) 111/66 (03/06 0718) SpO2:  [90 %-99 %] 95 % (03/06 0718) Weight:  [58.6 kg] 58.6 kg (03/05 1223)  Intake/Output from previous day: 03/05 0701 - 03/06 0700 In: 1900 [I.V.:1700; IV Piggyback:200] Out: 175 [Drains:75; Blood:100] Intake/Output this shift: No intake/output data recorded.  Alert, conversant, reporting no pain at present. Incision flat without erythema or drainage. (JP o/p ~70ml overnight, pulled this am). Full strength BUE and hand intrinsics.   Lab Results: No results for input(s): WBC, HGB, HCT, PLT in the last 72 hours. BMET No results for input(s): NA, K, CL, CO2, GLUCOSE, BUN, CREATININE, CALCIUM in the last 72 hours.  Studies/Results: Dg Cervical Spine 2-3 Views  Result Date: 12/23/2018 CLINICAL DATA:  Spine surgery EXAM: CERVICAL SPINE - 2-3 VIEW COMPARISON:  MRI 09/24/2018 FINDINGS: Two lateral views of the cervical spine were obtained intraoperatively. First image demonstrates localizing instrument bracketing the C4 vertebral body. Plate and fixating screws at C5 and C6. Subsequent image demonstrates anterior plate and screw fixation C3 through C5 with interbody device at C3-C4 and C4-C5. Radiopaque sponge material over the anterior neck soft tissues. IMPRESSION: Lateral images of the cervical spine obtained intraoperatively during cervical spine surgery Electronically Signed   By: Donavan Foil M.D.   On: 12/23/2018 16:57    Assessment/Plan: Improving  LOS: 0 days  Ok Per DrStern to d/c to home. She verbalizes understanding of d/c instructions and will call office to schedule f/u appt. Norco and Robaxin will be eRx'ed.    Verdis Prime 12/24/2018, 7:38 AM  Patient is doing well.   Discharge home.

## 2018-12-24 NOTE — Discharge Summary (Signed)
Physician Discharge Summary  Patient ID: Michelle Martinez MRN: [id number] DOB/AGE: October 18, 1951 68 y.o.  Admit date: 12/23/2018 Discharge date: 12/24/2018  Admission Diagnoses: Herniated nucleus pulposus, Cervical, cervical stenosis, cervical myelopathy, cervical radiculopathy, cervicalgia C 34 and C 45 levels    Discharge Diagnoses: Herniated nucleus pulposus, Cervical, cervical stenosis, cervical myelopathy, cervical radiculopathy, cervicalgia C 34 and C 45 levels s/p Cervical 3-4 Cervical 4-5 Anterior cervical decompression/discectomy/fusion (N/A) - Cervical 3-4 Cervical 4-5 Anterior cervical decompression/discectomy/fusion With PEEK cages, autograft, plate with exploration of fusion C 56 and removal of hardware      Active Problems:   Cervical myelopathy [facility])   Discharged Condition: good  Hospital Course: Michelle Martinez was admitted for surgery with HNP, stenosis, and radiculopathy. Following uncomplicated surgery(above), she recovered nicely and transferred to [facility]. She is mobilizing well.   Consults: None  Significant Diagnostic Studies: radiology: X-Ray: intra-op  Treatments: surgery: Cervical 3-4 Cervical 4-5 Anterior cervical decompression/discectomy/fusion (N/A) - Cervical 3-4 Cervical 4-5 Anterior cervical decompression/discectomy/fusion With PEEK cages, autograft, plate with exploration of fusion C 56 and removal of hardware     Discharge Exam: Blood pressure 111/66, pulse 77, temperature 98.2 F (36.8 C), temperature source Oral, resp. rate 18, height 5' 2.5" (1.588 m), weight 58.6 kg, SpO2 95 %. Alert, conversant, reporting no pain at present. Incision flat without erythema or drainage. (JP o/p ~52ml overnight, pulled this am). Full strength BUE and hand intrinsics.    Disposition:  Discharge to home. She verbalizes understanding of d/c instructions and will call office to schedule f/u appt. Norco and Robaxin will be eRx'ed.          Allergies as of 12/24/2018      Reactions   Codeine Nausea Only, Other (See Comments)   HALLUCINATIONS   Crestor [rosuvastatin] Other (See Comments)   MYALGIAS, CRAMPING    Sulfur Swelling   LIPS, HANDS, FEET   Adhesive [tape] Other (See Comments)   blisters       Discharge Instructions    Diet - low sodium heart healthy   Complete by:  As directed    Increase activity slowly   Complete by:  As directed      Allergies as of 12/24/2018      Reactions   Codeine Nausea Only, Other (See Comments)   HALLUCINATIONS   Crestor [rosuvastatin] Other (See Comments)   MYALGIAS, CRAMPING    Sulfur Swelling   LIPS, HANDS, FEET   Adhesive [tape] Other (See Comments)   blisters      Medication List    STOP taking these medications   ARTHROTEC 75 75-0.2 MG per tablet Generic drug:  diclofenac-misoprostol     TAKE these medications   buPROPion 150 MG 24 hr tablet Commonly known as:  WELLBUTRIN XL Take 1 tablet (150 mg total) by mouth daily.   diphenhydrAMINE 25 MG tablet Commonly known as:  BENADRYL Take 25 mg by mouth every 8 (eight) hours as needed for itching or allergies.   DULoxetine 60 MG capsule Commonly known as:  CYMBALTA TAKE 1 CAPSULE BY MOUTH ONCE DAILY   estradiol 0.1 MG/GM vaginal cream Commonly known as:  ESTRACE 1 gram vaginally twice weekly   famotidine 20 MG tablet Commonly known as:  PEPCID Take 20 mg by mouth daily as needed for heartburn or indigestion.   fluticasone 50 MCG/ACT nasal spray Commonly known as:  FLONASE Place 2 sprays into both nostrils every morning.   HYDROcodone-acetaminophen 5-325 MG tablet Commonly known as:  NORCO/VICODIN Take  2 tablets by mouth every 4 (four) hours as needed for severe pain ((score 7 to 10)).   hydrocortisone 2.5 % rectal cream Commonly known as:  ANUSOL-HC Place 1 application rectally 2 (two) times daily.   loratadine 10 MG tablet Commonly known as:  CLARITIN Take 10 mg by mouth every  morning.   methocarbamol 500 MG tablet Commonly known as:  ROBAXIN Take 1 tablet (500 mg total) by mouth every 6 (six) hours as needed for muscle spasms.   NONFORMULARY OR COMPOUNDED ITEM Estradiol 0.02% cream with Vit E.  Place 74ml vaginal and small amount externally three times weekly.  Disp:  3 month supply.  #4 RF.   Probiotic Caps Take 1 capsule by mouth daily.   psyllium 58.6 % powder Commonly known as:  METAMUCIL Take 1 packet by mouth daily.   Reclast 5 MG/100ML Soln injection Generic drug:  zoledronic acid Inject 5 mg into the vein. annually        Signed: Peggyann Shoals, MD 12/24/2018, 10:07 AM

## 2018-12-24 NOTE — Discharge Instructions (Signed)

## 2018-12-24 NOTE — Progress Notes (Signed)
OT Cancellation Note  Patient Details Name: Michelle Martinez MRN: [id number] DOB: 1951/01/13   Cancelled Treatment:    Reason Eval/Treat Not Completed: OT screened, no needs identified, will sign off Pt independent with ADL and proper techniques for movement. Pt denies numbness or pain in BUEs.  Ebony Hail Harold Hedge) Marsa Aris OTR/L Acute Rehabilitation Services Pager: [phone] Office: [phone]   Fredda Hammed 12/24/2018, 8:30 AM

## 2018-12-24 NOTE — Progress Notes (Signed)
Patient is discharged from room 3C02 at this time. Alert and in stable condition. IV site d/c'd and instructions read to patient and son with understanding verbalized. Left unit via wheelchair with all belongings at side.

## 2019-04-23 ENCOUNTER — Other Ambulatory Visit: Payer: Self-pay | Admitting: Obstetrics & Gynecology

## 2019-04-26 NOTE — Telephone Encounter (Signed)
Medication refill request: cymbalta 60mg  Last AEX:  02-26-18 Next AEX: 05-27-2019 Last MMG (if hormonal medication request): n/a Refill authorized: please approve if appropriate.

## 2019-05-25 ENCOUNTER — Other Ambulatory Visit: Payer: Self-pay

## 2019-05-26 ENCOUNTER — Other Ambulatory Visit: Payer: Self-pay | Admitting: Obstetrics & Gynecology

## 2019-05-26 NOTE — Telephone Encounter (Signed)
Spoke with patient. Patient states refill of wellbutrin not needed prior to appointment with Dr. Sabra Heck on 05-27-2019. RN advised would update the pharmacy.

## 2019-05-27 ENCOUNTER — Ambulatory Visit: Payer: Medicare Other | Admitting: Obstetrics & Gynecology

## 2019-05-27 NOTE — Progress Notes (Deleted)
68 y.o. B3A1937 Widowed White or Caucasian female here for annual exam.    No LMP recorded. Patient has had a hysterectomy.          Sexually active: {yes no:314532}  The current method of family planning is status post hysterectomy.    Exercising: {yes no:314532}  {types:19826} Smoker:  {YES NO:22349}  Health Maintenance: Pap:  ~2011  History of abnormal Pap:  no MMG:  2018 Right breast Lumpectomy  Colonoscopy:  03/20/17 Right Colectomy  BMD:   10/28/16 Osteoporosis  TDaP:  2014 Pneumonia vaccine(s):  2018 Shingrix:   Completed  Hep C testing: 11/20/16 neg  Screening Labs: ***   reports that she quit smoking about 41 years ago. She has never used smokeless tobacco. She reports current alcohol use of about 7.0 - 10.0 standard drinks of alcohol per week. She reports that she does not use drugs.  Past Medical History:  Diagnosis Date  . Anxiety   . Arthritis    hands & back - OA  . Complication of anesthesia    HYPOTENSION , DIFFICULTY WAKING UP   . Depression   . Dysrhythmia    OCC RAPID HEART BEAT  . Fatigue   . GERD (gastroesophageal reflux disease)   . Headache(784.0)    ocasonall sinus hedache  . Hypotension   . Melanoma ([place])   . Osteoporosis   . Palpitation   . PONV (postoperative nausea and vomiting)   . Syncope    SEES DR Mare Ferrari     Past Surgical History:  Procedure Laterality Date  . ANTERIOR CERVICAL DECOMP/DISCECTOMY FUSION N/A 12/23/2018   Procedure: Cervical 3-4 Cervical 4-5 Anterior cervical decompression/discectomy/fusion;  Surgeon: Erline Levine, MD;  Location: [facility];  Service: Neurosurgery;  Laterality: N/A;  Cervical 3-4 Cervical 4-5 Anterior cervical decompression/discectomy/fusion  . BREAST LUMPECTOMY WITH RADIOACTIVE SEED LOCALIZATION Right 12/11/2016   Procedure: RIGHT BREAST LUMPECTOMY WITH RADIOACTIVE SEED LOCALIZATION;  Surgeon: Autumn Messing III, MD;  Location: [facility];  Service: General;  Laterality: Right;  . CERVICAL LAMINECTOMY    . [place]  . LAPAROSCOPIC RIGHT COLECTOMY Right 03/20/2017   Procedure: LAPAROSCOPIC ASSISTED RIGHT COLECTOMY;  Surgeon: Jovita Kussmaul, MD;  Location: [facility];  Service: General;  Laterality: Right;  . LUMBAR FUSION  12/15/2012   Dr Vertell Limber  . TONSILLECTOMY    . TOTAL ABDOMINAL HYSTERECTOMY W/ BILATERAL SALPINGOOPHORECTOMY     Part of one ovary remians    Current Outpatient Medications  Medication Sig Dispense Refill  . buPROPion (WELLBUTRIN XL) 150 MG 24 hr tablet Take 1 tablet (150 mg total) by mouth daily. 90 tablet 4  . diphenhydrAMINE (BENADRYL) 25 MG tablet Take 25 mg by mouth every 8 (eight) hours as needed for itching or allergies.     . DULoxetine (CYMBALTA) 60 MG capsule TAKE 1 CAPSULE(60 MG) BY MOUTH DAILY 90 capsule 0  . estradiol (ESTRACE) 0.1 MG/GM vaginal cream 1 gram vaginally twice weekly (Patient not taking: Reported on 12/10/2018) 42.5 g 3  . famotidine (PEPCID) 20 MG tablet Take 20 mg by mouth daily as needed for heartburn or indigestion.    . fluticasone (FLONASE) 50 MCG/ACT nasal spray Place 2 sprays into both nostrils every morning.    Marland Kitchen HYDROcodone-acetaminophen (NORCO/VICODIN) 5-325 MG tablet Take 2 tablets by mouth every 4 (four) hours as needed for severe pain ((score 7 to 10)). 30 tablet 0  . hydrocortisone (ANUSOL-HC) 2.5 % rectal cream Place 1 application rectally 2 (two) times  daily. 30 g 1  . loratadine (CLARITIN) 10 MG tablet Take 10 mg by mouth every morning.    . methocarbamol (ROBAXIN) 500 MG tablet Take 1 tablet (500 mg total) by mouth every 6 (six) hours as needed for muscle spasms. 60 tablet 1  . NONFORMULARY OR COMPOUNDED ITEM Estradiol 0.02% cream with Vit E.  Place 54ml vaginal and small amount externally three times weekly.  Disp:  3 month supply.  #4 RF. (Patient not taking: Reported on 12/10/2018) 36 each 4  . Probiotic CAPS Take 1 capsule by mouth daily.    . psyllium (METAMUCIL) 58.6 % powder Take 1 packet by mouth daily.    . zoledronic acid  (RECLAST) 5 MG/100ML SOLN injection Inject 5 mg into the vein. annually     No current facility-administered medications for this visit.     Family History  Problem Relation Age of Onset  . Cancer Mother   . Heart attack Father   . Cancer Father     Review of Systems  Exam:   There were no vitals taken for this visit.  Height:      Ht Readings from Last 3 Encounters:  12/23/18 5' 2.5" (1.588 m)  12/17/18 5' 2.5" (1.588 m)  02/26/18 5\' 2"  (1.575 m)    General appearance: alert, cooperative and appears stated age Head: Normocephalic, without obvious abnormality, atraumatic Neck: no adenopathy, supple, symmetrical, trachea midline and thyroid {EXAM; THYROID:18604} Lungs: clear to auscultation bilaterally Breasts: {Exam; breast:13139::"normal appearance, no masses or tenderness"} Heart: regular rate and rhythm Abdomen: soft, non-tender; bowel sounds normal; no masses,  no organomegaly Extremities: extremities normal, atraumatic, no cyanosis or edema Skin: Skin color, texture, turgor normal. No rashes or lesions Lymph nodes: Cervical, supraclavicular, and axillary nodes normal. No abnormal inguinal nodes palpated Neurologic: Grossly normal   Pelvic: External genitalia:  no lesions              Urethra:  normal appearing urethra with no masses, tenderness or lesions              Bartholins and Skenes: normal                 Vagina: normal appearing vagina with normal color and discharge, no lesions              Cervix: {exam; cervix:14595}              Pap taken: {yes no:314532} Bimanual Exam:  Uterus:  {exam; uterus:12215}              Adnexa: {exam; adnexa:12223}               Rectovaginal: Confirms               Anus:  normal sphincter tone, no lesions  Chaperone was present for exam.  A:  Well Woman with normal exam  P:   {plan; gyn:5269::"mammogram","pap smear","return annually or prn"}

## 2019-05-30 DIAGNOSIS — G629 Polyneuropathy, unspecified: Secondary | ICD-10-CM

## 2019-05-30 HISTORY — DX: Polyneuropathy, unspecified: G62.9

## 2019-07-12 ENCOUNTER — Other Ambulatory Visit: Payer: Self-pay | Admitting: Obstetrics & Gynecology

## 2019-07-12 NOTE — Telephone Encounter (Signed)
Medication refill request: wellbutrin  Last AEX:  02-26-18 SM  Next AEX: 11-25-19  Last MMG (if hormonal medication request): n/a Refill authorized: Today, please advise.   Spoke with patient. Advised refill request just received from pharmacy, would send request to Dr. Sabra Heck. Patient agreeable. Medication pended for #90, 1RF. Please refill if appropriate.

## 2019-07-12 NOTE — Telephone Encounter (Signed)
Patient is calling to follow up on refill request °

## 2019-07-15 ENCOUNTER — Other Ambulatory Visit: Payer: Self-pay | Admitting: Obstetrics & Gynecology

## 2019-07-15 MED ORDER — NONFORMULARY OR COMPOUNDED ITEM: 4 refills | Status: DC

## 2019-07-15 NOTE — Telephone Encounter (Signed)
Patient is calling regarding a refill request for estradiol 0.02% cream with Vit E. Patient stated that prescription had to be called into [facility] on [address].

## 2019-07-15 NOTE — Telephone Encounter (Signed)
Medication refill request: Estradiol 2% with Vit. E  Last AEX:  02/26/18 Next AEX: 11/25/19 Last MMG (if hormonal medication request): 12/11/16 lumpectomy Refill authorized: 36 with 4 rf

## 2019-07-18 NOTE — Telephone Encounter (Signed)
Rx faxed to [facility] today

## 2019-07-19 NOTE — Progress Notes (Signed)
[facility]    Provider:  Dr Jaynee Eagles Requesting Provider: Dr. Erline Levine Primary Care Provider:  Josetta Huddle, MD  CC:  Memory loss  HPI:  Michelle Martinez is a 68 y.o. female here as requested by Dr. Erline Levine.  Past medical history cervical myelopathy status post ACDF with Dr. Vertell Limber, neuropathy, memory loss, orthostatic hypotension and syncope, lumbosacral spinal surgery by Dr. Vertell Limber in 01/30/13, hypercholesterolemia, osteoporosis.  She presented to Dr. Vertell Limber with cervical radiculopathy and myelopathy, status post ACDF, she was last seen she was doing well from the standpoint of her cervical spine but she was complaining of numbness in the feet and memory loss and she was referred to me.  She is widowed, her husband died in Jan 31, 2016.  She went through a period of emotional lability and at that time was on Cymbalta.  Per review of notes, she has been expressing her concern for her memory for several years however it started when her husband had ALS, he was in the [facility] as wake, this was a lot of stress at that time and thought stress was contributing.  She has a history of depression, trouble sleeping.  She smoked in the past but quit about 36 years ago, she drinks about 1 ounce of alcohol per week.   Since the surgery she is having tingling in the right leg and foot runs right down the anterior thigh, anterior lower leg to the foot. The whole foot up to the ankle feels asleep. Doesn't interrupt he life, sometimes she feels she stubs her right toe or her foot "drops" and only notices when walking, left leg is completely unaffected. The top of her foot is where she feels it the most and it may run up the leg to the thigh. Other than her occasional foot drop on the right, no weakness, no difficulty climbing stairs, no new low back pain or new radicular symptoms, she has the symptoms daily bit comes and goes, unclear triggers. She noticed it the first time she went out for a walk  after cervical acdf which was within a few days of her surgery she did extremely well. Stable. Very tolerable. Surgery was 12/23/2018. She has severe depression and I discussed she need to follow up with pcp.   She has been noticing memory changes for several years. Started after surgery, worse this time, started prior to her husband having ALS. She has noticed it fpr years, everyone has called her "dingy" but it is worse now, her husband dies, her son owns [employer] and with Covid19 she worries about him. She lives alone, she recently missed one ill but otherwise everything in the household is intact, no getting lost, no accidents, she has never been good at directions, she forgets names, she forgets where she puts things. She is establishing things to do to combat, writing lists, putting thins in the same place. Fathre had dementia maybe lewy body but having no symptoms of this disorder.  She sleeps well, no significant snoring or fatigue during the day.    Reviewed notes, labs and imaging from outside physicians, which showed:  BMP, CBC normal, 12/17/2018  I reviewed Dr. Melven Sartorius notes. Patient was seen for cervical radiculopathy, she is status post ACDF, doing very well with regard to her neck, she had impingement and tingling.  She complained of numbness in her legs and has more numbness in the right greater than left lower extremity.  It appears to be consistent with stocking  glove distribution of numbness and she also has decreased vibration sensation.  He felt she was well-healed from the standpoint of her neck.  Her strength is full in her upper extremities and her hand intrinsics are better.  She is also less hyperreflexic.  He referred her to me for memory loss and peripheral neuropathy.  She is doing well from the standpoint of her cervical spine.  I reviewed notes from Hale Bogus, MD. in 2018 patient was complaining about memory loss for the prior year, but she attributed it to the  worsening of her husband who had ALS, her friends call her scatterbrained, at that time she was not interested in referral.  I see she would refer to Dr. Jannifer Franklin in the past but I do not see where she came in.  Her husband went to the [facility] at weight there is a lot of stress and worry about memory.  Thought that stress can contribute.   Review of Systems: Patient complains of symptoms per HPI as well as the following symptoms stress, sensory changes, depression. Pertinent negatives and positives per HPI. All others negative.   Social History   Socioeconomic History  . Marital status: Widowed    Spouse name: Not on file  . Number of children: 2  . Years of education: 16  . Highest education level: Bachelor's degree (e.g., BA, AB, BS)  Occupational History  . Not on file  Social Needs  . Financial resource strain: Not on file  . Food insecurity    Worry: Not on file    Inability: Not on file  . Transportation needs    Medical: Not on file    Non-medical: Not on file  Tobacco Use  . Smoking status: Former Smoker    Quit date: 1978    Years since quitting: 42.7  . Smokeless tobacco: Never Used  Substance and Sexual Activity  . Alcohol use: Yes    Alcohol/week: 7.0 standard drinks    Types: 7 Glasses of wine per week    Comment: 1/ DAY  . Drug use: Never  . Sexual activity: Not Currently    Partners: Male    Birth control/protection: Surgical    Comment: Hysterectomy  Lifestyle  . Physical activity    Days per week: Not on file    Minutes per session: Not on file  . Stress: Not on file  Relationships  . Social Herbalist on phone: Not on file    Gets together: Not on file    Attends religious service: Not on file    Active member of club or organization: Not on file    Attends meetings of clubs or organizations: Not on file    Relationship status: Not on file  . Intimate partner violence    Fear of current or ex partner: Not on file    Emotionally  abused: Not on file    Physically abused: Not on file    Forced sexual activity: Not on file  Other Topics Concern  . Not on file  Social History Narrative   Lives at home alone   Retired   Caffeine: 2 cups/day    Family History  Problem Relation Age of Onset  . Cancer Mother   . Other Mother        Glioblastoma  . Heart attack Father   . Cancer Father        prostate  . Dementia Father  late 30s    Past Medical History:  Diagnosis Date  . Anxiety   . Arthritis    hands & back - OA  . Complication of anesthesia    HYPOTENSION , DIFFICULTY WAKING UP   . Depression   . Dysrhythmia    OCC RAPID HEART BEAT  . Fatigue   . GERD (gastroesophageal reflux disease)   . Headache(784.0)    ocasonall sinus hedache  . Hypotension   . Melanoma ([place])   . Osteoporosis   . Palpitation   . PONV (postoperative nausea and vomiting)   . Syncope    SEES DR BRACKBILL     Patient Active Problem List   Diagnosis Date Noted  . Cervical myelopathy ([place]) 12/23/2018  . 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 Villous adenoma of right colon 03/20/2017  . Osteoporosis 11/20/2016  . Palpitation 04/10/2014  . Osteoarthritis of spine 03/02/2013  . Orthostatic hypotension 03/02/2013  . Hypercholesterolemia 03/25/2011    Past Surgical History:  Procedure Laterality Date  . ANTERIOR CERVICAL DECOMP/DISCECTOMY FUSION N/A 12/23/2018   Procedure: Cervical 3-4 Cervical 4-5 Anterior cervical decompression/discectomy/fusion;  Surgeon: Erline Levine, MD;  Location: [facility];  Service: Neurosurgery;  Laterality: N/A;  Cervical 3-4 Cervical 4-5 Anterior cervical decompression/discectomy/fusion  . BREAST LUMPECTOMY WITH RADIOACTIVE SEED LOCALIZATION Right 12/11/2016   Procedure: RIGHT BREAST LUMPECTOMY WITH RADIOACTIVE SEED LOCALIZATION;  Surgeon: Autumn Messing III, MD;  Location: [facility];  Service: General;  Laterality: Right;  . CERVICAL LAMINECTOMY    . [place]  .  LAPAROSCOPIC RIGHT COLECTOMY Right 03/20/2017   Procedure: LAPAROSCOPIC ASSISTED RIGHT COLECTOMY;  Surgeon: Jovita Kussmaul, MD;  Location: [facility];  Service: General;  Laterality: Right;  . LUMBAR FUSION  12/15/2012   Dr Vertell Limber  . melanoma procedures     Dr. Ann Maki & Dr. Delman Cheadle  . TONSILLECTOMY    . TOTAL ABDOMINAL HYSTERECTOMY W/ BILATERAL SALPINGOOPHORECTOMY     Part of one ovary remians    Current Outpatient Medications  Medication Sig Dispense Refill  . buPROPion (WELLBUTRIN XL) 150 MG 24 hr tablet TAKE 1 TABLET(150 MG) BY MOUTH DAILY 90 tablet 1  . Cholecalciferol (VITAMIN D3 PO) Take 25 mg by mouth daily.    . Diclofenac-miSOPROStol 75-0.2 MG TBEC TK 1 T PO QD    . diphenhydrAMINE (BENADRYL) 25 MG tablet Take 25 mg by mouth every 8 (eight) hours as needed for itching or allergies.     . DULoxetine (CYMBALTA) 60 MG capsule TAKE 1 CAPSULE(60 MG) BY MOUTH DAILY 90 capsule 0  . famotidine (PEPCID) 20 MG tablet Take 20 mg by mouth daily as needed for heartburn or indigestion.    . fluticasone (FLONASE) 50 MCG/ACT nasal spray Place 2 sprays into both nostrils every morning.    . NONFORMULARY OR COMPOUNDED ITEM Estradiol 0.02% cream with Vit E.  Place 37ml vaginal and small amount externally three times weekly.  Disp:  3 month supply.  #4 RF. 36 each 4  . OVER THE COUNTER MEDICATION 1 each daily. Turmeric/Curcumin x 285    . OVER THE COUNTER MEDICATION as needed. Tylenol Sinus    . Pseudoephedrine HCl (SUDAFED PO) Take by mouth as needed.    . psyllium (METAMUCIL) 58.6 % powder Take 1 packet by mouth daily.    Marland Kitchen estradiol (ESTRACE) 0.1 MG/GM vaginal cream 1 gram vaginally twice weekly (Patient not taking: Reported on 12/10/2018) 42.5 g 3  . hydrocortisone (ANUSOL-HC) 2.5 % rectal  cream Place 1 application rectally 2 (two) times daily. (Patient not taking: Reported on 07/20/2019) 30 g 1  . methocarbamol (ROBAXIN) 500 MG tablet Take 1 tablet (500 mg total) by mouth every 6 (six) hours as needed for  muscle spasms. (Patient not taking: Reported on 07/20/2019) 60 tablet 1  . zoledronic acid (RECLAST) 5 MG/100ML SOLN injection Inject 5 mg into the vein. annually     No current facility-administered medications for this visit.     Allergies as of 07/20/2019 - Review Complete 12/23/2018  Allergen Reaction Noted  . Codeine Nausea Only and Other (See Comments) 12/08/2012  . Crestor [rosuvastatin] Other (See Comments) 05/04/2012  . Sulfur Swelling 03/25/2011  . Adhesive [tape] Other (See Comments) 03/20/2017    Vitals: BP 116/65 (BP Location: Right Arm, Patient Position: Sitting)   Pulse 85   Temp (!) 96.8 F (36 C) Comment: taken at checkin  Ht 5\' 3"  (1.6 m)   Wt 128 lb (58.1 kg)   BMI 22.67 kg/m  Last Weight:  Wt Readings from Last 1 Encounters:  07/20/19 128 lb (58.1 kg)   Last Height:   Ht Readings from Last 1 Encounters:  07/20/19 5\' 3"  (1.6 m)     Physical exam: Exam: Gen: NAD, conversant, well nourised, well groomed                     CV: RRR, no MRG. No Carotid Bruits. No peripheral edema, warm, nontender Eyes: Conjunctivae clear without exudates or hemorrhage  Neuro: Detailed Neurologic Exam  Speech:    Speech is normal; fluent and spontaneous with normal comprehension.  Cognition:  MMSE - Mini Mental State Exam 07/20/2019  Orientation to time 5  Orientation to Place 4  Registration 3  Attention/ Calculation 2  Recall 1  Language- name 2 objects 2  Language- repeat 1  Language- follow 3 step command 3  Language- read & follow direction 1  Write a sentence 1  Copy design 1  Total score 24       The patient is oriented to person, place, and time;     recent and remote memory intact;     language fluent;     normal attention, concentration,     fund of knowledge Cranial Nerves:    The pupils are equal, round, and reactive to light. The fundi are normal and spontaneous venous pulsations are present. Visual fields are full to finger confrontation.  Extraocular movements are intact. Trigeminal sensation is intact and the muscles of mastication are normal. The face is symmetric. The palate elevates in the midline. Hearing intact. Voice is normal. Shoulder shrug is normal. The tongue has normal motion without fasciculations.   Coordination:    Normal finger to nose and heel to shin. Normal rapid alternating movements.   Gait:    Heel-toe and tandem gait are normal.   Motor Observation:    No asymmetry, no atrophy, and no involuntary movements noted. Tone:    Normal muscle tone.    Posture:    Posture is normal. normal erect    Strength: mild prox weakness right hip flexion and leg as compared to the left. Giveway right dorsiflexion. Eual foot eversion and inversion strength bilaterally.     Sensation: intact to LT, decrease to pinprick anteromedial lower legs bilaterally.      Reflex Exam:  DTR's:    Deep tendon reflexes in the upper and lower extremities are brisk bilaterally.   Toes:  The toes are downgoing bilaterally.   Clonus:    2 beats clonus right leg    Assessment/Plan:  Very lovely patient. Will follow clinically, she did not want to pursue emg/ncs or formal memory testing at this time   - PT [address] for her leg. Will perform emg/ncs if patient feels she does not improve. - Memory loss: Likely normal cognitive aging, stress and depression. But will order labs, MRi of the brain. Recommend therapy and medication management. Consider formal neurocognitive testing in the future, was surprised she scored on a 24/30 on MMSE. Bloodwork and MRI brain for reversible causes of cognitive changes also given right leg weakness and sensory changes look for stroke or other lesion in thebrain.   Orders Placed This Encounter  Procedures  . MR BRAIN W WO CONTRAST  . B12 and Folate Panel  . TSH  . Homocysteine  . Vitamin B1  . Methylmalonic acid, serum  . Basic Metabolic Panel  . Ambulatory referral to Physical Therapy    No orders of the defined types were placed in this encounter.   Cc: Josetta Huddle, MD,  Erline Levine, MD  Sarina Ill, MD  [facility] [address] [place], [place] [address]  Phone [phone] Fax [phone]

## 2019-07-20 ENCOUNTER — Other Ambulatory Visit: Payer: Self-pay

## 2019-07-20 ENCOUNTER — Ambulatory Visit (INDEPENDENT_AMBULATORY_CARE_PROVIDER_SITE_OTHER): Payer: Medicare Other | Admitting: Neurology

## 2019-07-20 ENCOUNTER — Telehealth: Payer: Self-pay | Admitting: Neurology

## 2019-07-20 ENCOUNTER — Encounter: Payer: Self-pay | Admitting: Neurology

## 2019-07-20 ENCOUNTER — Encounter

## 2019-07-20 VITALS — BP 116/65 | HR 85 | Temp 96.8°F | Ht 63.0 in | Wt 128.0 lb

## 2019-07-20 DIAGNOSIS — G3184 Mild cognitive impairment, so stated: Secondary | ICD-10-CM | POA: Diagnosis not present

## 2019-07-20 DIAGNOSIS — M6281 Muscle weakness (generalized): Secondary | ICD-10-CM

## 2019-07-20 DIAGNOSIS — R29898 Other symptoms and signs involving the musculoskeletal system: Secondary | ICD-10-CM

## 2019-07-20 DIAGNOSIS — R262 Difficulty in walking, not elsewhere classified: Secondary | ICD-10-CM

## 2019-07-20 DIAGNOSIS — R200 Anesthesia of skin: Secondary | ICD-10-CM

## 2019-07-20 NOTE — Patient Instructions (Addendum)
Physical Therapy -[address], in the future consider emg/ncs Memory loss: MRI of the brain and blood work. In the future consider formal memory testing    Fall Prevention in the Home, Adult Falls can cause injuries. They can happen to people of all ages. There are many things you can do to make your home safe and to help prevent falls. Ask for help when making these changes, if needed. What actions can I take to prevent falls? General Instructions  Use good lighting in all rooms. Replace any light bulbs that burn out.  Turn on the lights when you go into a dark area. Use night-lights.  Keep items that you use often in easy-to-reach places. Lower the shelves around your home if necessary.  Set up your furniture so you have a clear path. Avoid moving your furniture around.  Do not have throw rugs and other things on the floor that can make you trip.  Avoid walking on wet floors.  If any of your floors are uneven, fix them.  Add color or contrast paint or tape to clearly mark and help you see: ? Any grab bars or handrails. ? First and last steps of stairways. ? Where the edge of each step is.  If you use a stepladder: ? Make sure that it is fully opened. Do not climb a closed stepladder. ? Make sure that both sides of the stepladder are locked into place. ? Ask someone to hold the stepladder for you while you use it.  If there are any pets around you, be aware of where they are. What can I do in the bathroom?      Keep the floor dry. Clean up any water that spills onto the floor as soon as it happens.  Remove soap buildup in the tub or shower regularly.  Use non-skid mats or decals on the floor of the tub or shower.  Attach bath mats securely with double-sided, non-slip rug tape.  If you need to sit down in the shower, use a plastic, non-slip stool.  Install grab bars by the toilet and in the tub and shower. Do not use towel bars as grab bars. What can I do in the  bedroom?  Make sure that you have a light by your bed that is easy to reach.  Do not use any sheets or blankets that are too big for your bed. They should not hang down onto the floor.  Have a firm chair that has side arms. You can use this for support while you get dressed. What can I do in the kitchen?  Clean up any spills right away.  If you need to reach something above you, use a strong step stool that has a grab bar.  Keep electrical cords out of the way.  Do not use floor polish or wax that makes floors slippery. If you must use wax, use non-skid floor wax. What can I do with my stairs?  Do not leave any items on the stairs.  Make sure that you have a light switch at the top of the stairs and the bottom of the stairs. If you do not have them, ask someone to add them for you.  Make sure that there are handrails on both sides of the stairs, and use them. Fix handrails that are broken or loose. Make sure that handrails are as long as the stairways.  Install non-slip stair treads on all stairs in your home.  Avoid having throw rugs  at the top or bottom of the stairs. If you do have throw rugs, attach them to the floor with carpet tape.  Choose a carpet that does not hide the edge of the steps on the stairway.  Check any carpeting to make sure that it is firmly attached to the stairs. Fix any carpet that is loose or worn. What can I do on the outside of my home?  Use bright outdoor lighting.  Regularly fix the edges of walkways and driveways and fix any cracks.  Remove anything that might make you trip as you walk through a door, such as a raised step or threshold.  Trim any bushes or trees on the path to your home.  Regularly check to see if handrails are loose or broken. Make sure that both sides of any steps have handrails.  Install guardrails along the edges of any raised decks and porches.  Clear walking paths of anything that might make someone trip, such as tools  or rocks.  Have any leaves, snow, or ice cleared regularly.  Use sand or salt on walking paths during winter.  Clean up any spills in your garage right away. This includes grease or oil spills. What other actions can I take?  Wear shoes that: ? Have a low heel. Do not wear high heels. ? Have rubber bottoms. ? Are comfortable and fit you well. ? Are closed at the toe. Do not wear open-toe sandals.  Use tools that help you move around (mobility aids) if they are needed. These include: ? Canes. ? Walkers. ? Scooters. ? Crutches.  Review your medicines with your doctor. Some medicines can make you feel dizzy. This can increase your chance of falling. Ask your doctor what other things you can do to help prevent falls. Where to find more information  Centers for Disease Control and Prevention, STEADI: https://garcia.biz/  [employer] on Aging: BrainJudge.co.uk Contact a doctor if:  You are afraid of falling at home.  You feel weak, drowsy, or dizzy at home.  You fall at home. Summary  There are many simple things that you can do to make your home safe and to help prevent falls.  Ways to make your home safe include removing tripping hazards and installing grab bars in the bathroom.  Ask for help when making these changes in your home. This information is not intended to replace advice given to you by your health care provider. Make sure you discuss any questions you have with your health care provider. Document Released: 08/02/2009 Document Revised: 01/27/2019 Document Reviewed: 05/21/2017 Elsevier Patient Education  2020 [employer].

## 2019-07-20 NOTE — Telephone Encounter (Signed)
[employer] medicare order sent to [facility]. No auth they will reach out to the patient to schedule.  

## 2019-07-22 ENCOUNTER — Ambulatory Visit
Admission: RE | Admit: 2019-07-22 | Discharge: 2019-07-22 | Disposition: A | Discharge status: Home / Self Care | Payer: Medicare Other | Source: Ambulatory Visit | Attending: Neurology | Admitting: Neurology

## 2019-07-22 DIAGNOSIS — G3184 Mild cognitive impairment, so stated: Secondary | ICD-10-CM

## 2019-07-22 DIAGNOSIS — R262 Difficulty in walking, not elsewhere classified: Secondary | ICD-10-CM

## 2019-07-22 DIAGNOSIS — R200 Anesthesia of skin: Secondary | ICD-10-CM

## 2019-07-22 DIAGNOSIS — R29898 Other symptoms and signs involving the musculoskeletal system: Secondary | ICD-10-CM | POA: Diagnosis not present

## 2019-07-22 DIAGNOSIS — M6281 Muscle weakness (generalized): Secondary | ICD-10-CM

## 2019-07-22 MED ORDER — GADOBENATE DIMEGLUMINE 529 MG/ML IV SOLN
12.0000 mL | Freq: Once | INTRAVENOUS | Status: AC | PRN
  Administered 2019-07-22: 12 mL via INTRAVENOUS

## 2019-07-23 ENCOUNTER — Other Ambulatory Visit: Payer: Self-pay | Admitting: Obstetrics & Gynecology

## 2019-07-25 LAB — BASIC METABOLIC PANEL
BUN/Creatinine Ratio: 21 (ref 12–28)
BUN: 16 mg/dL (ref 8–27)
CO2: 27 mmol/L (ref 20–29)
Calcium: 10 mg/dL (ref 8.7–10.3)
Chloride: 102 mmol/L (ref 96–106)
Creatinine, Ser: 0.78 mg/dL (ref 0.57–1.00)
GFR calc Af Amer: 91 mL/min/{1.73_m2} (ref >59)
GFR calc non Af Amer: 79 mL/min/{1.73_m2} (ref >59)
Glucose: 85 mg/dL (ref 65–99)
Potassium: 4.5 mmol/L (ref 3.5–5.2)
Sodium: 141 mmol/L (ref 134–144)

## 2019-07-25 LAB — METHYLMALONIC ACID, SERUM: Methylmalonic Acid: 187 nmol/L (ref 0–378)

## 2019-07-25 LAB — HOMOCYSTEINE: Homocysteine: 9.6 umol/L (ref 0.0–17.2)

## 2019-07-25 LAB — TSH: TSH: 0.881 u[IU]/mL (ref 0.450–4.500)

## 2019-07-25 LAB — B12 AND FOLATE PANEL
Folate: >20 ng/mL (ref >3.0)
Vitamin B-12: 575 pg/mL (ref 232–1245)

## 2019-07-25 LAB — VITAMIN B1: Thiamine: 119.1 nmol/L (ref 66.5–200.0)

## 2019-07-25 NOTE — Telephone Encounter (Signed)
Medication refill request: Duloxetine Last AEX:  02/26/18 SM Next AEX: 11/25/19 Last MMG (if hormonal medication request): 12/11/16 lumpectomy Refill authorized: Please advise; Order pended #90 w/1 refill if authorized

## 2019-08-01 ENCOUNTER — Encounter: Payer: Self-pay | Admitting: *Deleted

## 2019-08-01 ENCOUNTER — Telehealth: Payer: Self-pay | Admitting: Neurology

## 2019-08-01 NOTE — Telephone Encounter (Signed)
Pt called waning to know if her CT scan results have come in. Please advise.

## 2019-08-01 NOTE — Telephone Encounter (Signed)
I think watch and wait would be the best, have her follow up with me in 6-12 months to see how she is doing thanks!

## 2019-08-01 NOTE — Telephone Encounter (Signed)
I spoke with pt and discussed the MRI brain results from Dr. Jaynee Eagles. Advised pt the results were sent to mychart. Pt's questions were answered. Order for memory testing was not placed previously d/t note in office visit stating the memory testing would be considered in the future. Pt is amenable to this if Dr. Jaynee Eagles feels it is necessary. Otherwise, pt is ok with waiting and seeing "how this goes" given her MRI results.

## 2019-08-01 NOTE — Telephone Encounter (Signed)
Spoke with the pt and discussed Dr. Cathren Laine message. Pt is amenable to the plan to watch and wait. I scheduled her for a f/u 01/25/2020 @ 9:30 AM arrival 9-9:15. Pt advised if she would like to be seen sooner, or proceed with the testing to give [place] a call. Her questions were answered during the call. Pt had forgotten mychart pw. I advised she request password reset. She verbalized appreciation.

## 2019-08-01 NOTE — Telephone Encounter (Signed)
Sent pt mychart message but if we do not hear back today, I will call her.

## 2019-08-15 ENCOUNTER — Encounter: Payer: Self-pay | Admitting: Physical Therapy

## 2019-08-15 ENCOUNTER — Other Ambulatory Visit: Payer: Self-pay

## 2019-08-15 ENCOUNTER — Ambulatory Visit: Payer: Medicare Other | Attending: Neurology | Admitting: Physical Therapy

## 2019-08-15 DIAGNOSIS — R209 Unspecified disturbances of skin sensation: Secondary | ICD-10-CM | POA: Insufficient documentation

## 2019-08-15 DIAGNOSIS — M6281 Muscle weakness (generalized): Secondary | ICD-10-CM | POA: Diagnosis not present

## 2019-08-15 DIAGNOSIS — R208 Other disturbances of skin sensation: Secondary | ICD-10-CM

## 2019-08-15 NOTE — Therapy (Signed)
[place], [place], [address] Phone: [phone]   Fax:  [phone]  Physical Therapy Evaluation  Patient Details  Name: Michelle Martinez MRN: [id number] Date of Birth: 1951/01/09 Referring Provider (PT): Melvenia Beam, MD    Encounter Date: 08/15/2019  PT End of Session - 08/15/19 1236    Visit Number  1    Number of Visits  13    Date for PT Re-Evaluation  09/26/19    Authorization Type  MCR: Kx mod at 15th visit, progress note at 10th visit    PT Start Time  1146    PT Stop Time  1231    PT Time Calculation (min)  45 min    Activity Tolerance  Patient tolerated treatment well    Behavior During Therapy  [facility] for tasks assessed/performed       Past Medical History:  Diagnosis Date  . Anxiety   . Arthritis    hands & back - OA  . Complication of anesthesia    HYPOTENSION , DIFFICULTY WAKING UP   . Depression   . Dysrhythmia    OCC RAPID HEART BEAT  . Fatigue   . GERD (gastroesophageal reflux disease)   . Headache(784.0)    ocasonall sinus hedache  . Hypotension   . Melanoma ([place])   . Osteoporosis   . Palpitation   . PONV (postoperative nausea and vomiting)   . Syncope    SEES DR Mare Ferrari     Past Surgical History:  Procedure Laterality Date  . ANTERIOR CERVICAL DECOMP/DISCECTOMY FUSION N/A 12/23/2018   Procedure: Cervical 3-4 Cervical 4-5 Anterior cervical decompression/discectomy/fusion;  Surgeon: Erline Levine, MD;  Location: [place];  Service: Neurosurgery;  Laterality: N/A;  Cervical 3-4 Cervical 4-5 Anterior cervical decompression/discectomy/fusion  . BREAST LUMPECTOMY WITH RADIOACTIVE SEED LOCALIZATION Right 12/11/2016   Procedure: RIGHT BREAST LUMPECTOMY WITH RADIOACTIVE SEED LOCALIZATION;  Surgeon: Autumn Messing III, MD;  Location: [place];  Service: General;  Laterality: Right;  . CERVICAL LAMINECTOMY    . [place]  . LAPAROSCOPIC RIGHT COLECTOMY Right  03/20/2017   Procedure: LAPAROSCOPIC ASSISTED RIGHT COLECTOMY;  Surgeon: Jovita Kussmaul, MD;  Location: [place];  Service: General;  Laterality: Right;  . LUMBAR FUSION  12/15/2012   Dr Vertell Limber  . melanoma procedures     Dr. Ann Maki & Dr. Delman Cheadle  . TONSILLECTOMY    . TOTAL ABDOMINAL HYSTERECTOMY W/ BILATERAL SALPINGOOPHORECTOMY     Part of one ovary remians    There were no vitals filed for this visit.   Subjective Assessment - 08/15/19 1152    Subjective  pt presents to OPPT with CC of Numbness and cold into the R foot/ leg. she reports this began about a 1 month ACDF 01/02/2019 with no other report of MOI. since onset the symptoms have remained the same. she reports occasional LLE but mostly RLE only. She rpoerts she does have feeling but it feels like it is waking up from being asleep. pt reports its more in the knee and foot  on the R side    How long can you sit comfortably?  unlimited    How long can you stand comfortably?  unlimited    How long can you walk comfortably?  unlimited    Currently in Pain?  Yes    Pain Score  0-No pain    Pain Location  Leg    Pain Orientation  Right;Lower  Pain Descriptors / Indicators  Tingling;Numbness    Pain Type  Chronic pain    Pain Onset  More than a month ago    Pain Frequency  Constant    Aggravating Factors   nothing specific    Pain Relieving Factors  nothing she can    Effect of Pain on Daily Activities  no specific effects         [facility] PT Assessment - 08/15/19 1157      Assessment   Medical Diagnosis  Right leg weakness, Right leg numbness     Referring Provider (PT)  Melvenia Beam, MD     Onset Date/Surgical Date  --   02/02/2019   Hand Dominance  Right    Next MD Visit  --   unsure of date   Prior Therapy  yes      Precautions   Precautions  None      Restrictions   Weight Bearing Restrictions  No      Balance Screen   Has the patient fallen in the past 6 months  No      [place] residence    Living Arrangements  Alone    Type of [place] to enter    Entrance Stairs-Number of Steps  4    Entrance Stairs-Rails  Can reach both   ascending   Home Layout  Two level    Alternate Level Stairs-Number of Steps  16    Alternate Level Stairs-Rails  Left   ascending   Home Equipment  None      Prior Function   Level of Independence  Independent    Vocation  Retired    Leisure  walking, aerobics,       Cognition   Overall Cognitive Status  Within Functional Limits for tasks assessed   pt reports having difficulty with memory     Observation/Other Assessments   Focus on Therapeutic Outcomes (FOTO)   28% limited   predicted 25% limited     Sensation   Light Touch  Impaired by gross assessment    Additional Comments  diminished light touch sensations along L4-L5 dermatomes on the R       Posture/Postural Control   Posture/Postural Control  Postural limitations    Postural Limitations  Rounded Shoulders;Forward head      ROM / Strength   AROM / PROM / Strength  Strength      Strength   Strength Assessment Site  Hip;Knee;Ankle    Right/Left Hip  Right;Left    Right Hip Flexion  4-/5    Right Hip Extension  4-/5    Right Hip External Rotation   4-/5    Right Hip Internal Rotation  4-/5    Right Hip ABduction  4-/5    Right Hip ADduction  4-/5    Left Hip Flexion  5/5    Left Hip Extension  4-/5    Left Hip External Rotation  5/5    Left Hip Internal Rotation  5/5    Left Hip ABduction  4+/5    Left Hip ADduction  5/5    Right/Left Knee  Right;Left    Right Knee Flexion  4/5    Right Knee Extension  4/5    Left Knee Flexion  5/5    Left Knee Extension  5/5    Right/Left Ankle  Right;Left    Right  Ankle Dorsiflexion  4/5    Right Ankle Plantar Flexion  4/5    Right Ankle Inversion  4-/5    Right Ankle Eversion  4-/5    Left Ankle Dorsiflexion  5/5    Left Ankle Plantar Flexion  5/5    Left Ankle Inversion  4+/5     Left Ankle Eversion  4+/5      Palpation   Palpation comment  palpation along piriforims noted no pain but increase in N/T sensation throughout RLE      Special Tests    Special Tests  Lumbar    Lumbar Tests  Slump Test      Slump test   Findings  Positive    Side  Right    Comment  increase concordant N/T sensation      Ambulation/Gait   Ambulation/Gait  Yes    Gait Pattern  Step-through pattern                Objective measurements completed on examination: See above findings.      [place] Adult PT Treatment/Exercise - 08/15/19 1157      Manual Therapy   Manual Therapy  Neural Stretch    Neural Stretch  sciatic nerve glide on RLE    pt noted decreased tingling sensation     Ankle Exercises: Seated   Other Seated Ankle Exercises  ankle DF with red theraband 2 x 10             PT Education - 08/15/19 1235    Education Details  evaluation findings, POC, goals, HEP with proper form/ rationale    Person(s) Educated  Patient    Methods  Explanation;Verbal cues    Comprehension  Verbalized understanding;Verbal cues required       PT Short Term Goals - 08/15/19 1242      PT SHORT TERM GOAL #1   Title  pt to be I with inital HEP    Time  6    Period  Weeks    Status  New    Target Date  09/26/19        PT Long Term Goals - 08/15/19 1242      PT LONG TERM GOAL #1   Title  pt to increase gross RLE strength to >/= 4+/5 to promote stability and maximize safety with ADLS    Time  6    Period  Weeks    Status  New    Target Date  09/26/19      PT LONG TERM GOAL #2   Title  pt to report decreased tingling / altered sensation in the RLE to </= intermittent for >/= 1-2 weeks for improvement in condition    Time  6    Period  Weeks    Status  New    Target Date  09/26/19      PT LONG TERM GOAL #3   Title  increase FOTO score to </= 25% limited to demo improvement in function    Time  6    Period  Weeks    Status  New    Target Date  09/26/19       PT LONG TERM GOAL #4   Title  pt to be Ind with all HEP given as of last visit to maintain and progress current level of function    Time  6    Period  Weeks    Status  New    Target Date  09/26/19             Plan - 08/15/19 1236    Clinical Impression Statement  pt presents to OPPT with CC of RLE weakness and altered sensation starting around 02/02/2019 after she had a ACDF on 01/02/2019. She has functional AROM, with weakness noted grossly at 4-/5 in the RLE compared bil. She has diminshed light tough sensation located along the L4-L5 dermatomes on the RLE, and noted increased symptoms during palpation of the piriformis. she reported relief of tingling following nerve glides and ankle strengthening. She would benfit from physical therapy to improve RLE strength, reduce N/T, promote effiency / safety with gait and maximize her function by addressing the deficits listed.    Personal Factors and Comorbidities  Comorbidity 3+    Comorbidities  hx or anxiety, depression, and cancer    Stability/Clinical Decision Making  Evolving/Moderate complexity    Clinical Decision Making  Moderate    Rehab Potential  Good    PT Frequency  2x / week    PT Duration  6 weeks    PT Treatment/Interventions  ADLs/Self Care Home Management;Cryotherapy;Electrical Stimulation;Moist Heat;Iontophoresis 4mg /ml Dexamethasone;Ultrasound;Gait training;Therapeutic activities;Therapeutic exercise;Balance training;Neuromuscular re-education;Manual techniques;Passive range of motion;Dry needling;Taping;Patient/family education    PT Next Visit Plan  review/ update HEP, gross RLE strengthening, sciatic nerve glides, gait/ balance training, DF endurance training    PT Home Exercise Plan  standing/ seated ankle DF, sciatic nerve glides, bridge, sidelying hip abduction, sidelying hip ER    Consulted and Agree with Plan of Care  Patient       Patient will benefit from skilled therapeutic intervention in order to  improve the following deficits and impairments:  Decreased strength, Abnormal gait, Decreased balance, Decreased activity tolerance, Decreased endurance, Improper body mechanics, Impaired sensation  Visit Diagnosis: Muscle weakness (generalized)  Other disturbances of skin sensation     Problem List Patient Active Problem List   Diagnosis Date Noted  . Cervical myelopathy ([place]) 12/23/2018  . 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 Villous adenoma of right colon 03/20/2017  . Osteoporosis 11/20/2016  . Palpitation 04/10/2014  . Osteoarthritis of spine 03/02/2013  . Orthostatic hypotension 03/02/2013  . Hypercholesterolemia 03/25/2011   Starr [place] PT, DPT, LAT, ATC  08/15/19  12:54 PM      [facility] [address] [place], [place], [address] Phone: [phone]   Fax:  [phone]  Name: Michelle Martinez MRN: [id number] Date of Birth: [birth year]

## 2019-08-29 ENCOUNTER — Encounter: Payer: Self-pay | Admitting: Physical Therapy

## 2019-08-29 ENCOUNTER — Other Ambulatory Visit: Payer: Self-pay

## 2019-08-29 ENCOUNTER — Ambulatory Visit: Payer: Medicare Other | Attending: Neurology | Admitting: Physical Therapy

## 2019-08-29 DIAGNOSIS — R208 Other disturbances of skin sensation: Secondary | ICD-10-CM

## 2019-08-29 DIAGNOSIS — M6281 Muscle weakness (generalized): Secondary | ICD-10-CM | POA: Insufficient documentation

## 2019-08-29 DIAGNOSIS — R209 Unspecified disturbances of skin sensation: Secondary | ICD-10-CM | POA: Insufficient documentation

## 2019-08-29 NOTE — Therapy (Signed)
[place], [place], [address] Phone: [phone]   Fax:  [phone]  Physical Therapy Treatment  Patient Details  Name: Michelle Martinez MRN: [id number] Date of Birth: [birth year] Referring Provider (PT): Melvenia Beam, MD    Encounter Date: 08/29/2019  PT End of Session - 08/29/19 1108    Visit Number  2    Number of Visits  13    Date for PT Re-Evaluation  09/26/19    Authorization Type  MCR: Kx mod at 15th visit, progress note at 10th visit    PT Start Time  1105    PT Stop Time  1143    PT Time Calculation (min)  38 min       Past Medical History:  Diagnosis Date  . Anxiety   . Arthritis    hands & back - OA  . Complication of anesthesia    HYPOTENSION , DIFFICULTY WAKING UP   . Depression   . Dysrhythmia    OCC RAPID HEART BEAT  . Fatigue   . GERD (gastroesophageal reflux disease)   . Headache(784.0)    ocasonall sinus hedache  . Hypotension   . Melanoma ([place])   . Osteoporosis   . Palpitation   . PONV (postoperative nausea and vomiting)   . Syncope    SEES DR Mare Ferrari     Past Surgical History:  Procedure Laterality Date  . ANTERIOR CERVICAL DECOMP/DISCECTOMY FUSION N/A 12/23/2018   Procedure: Cervical 3-4 Cervical 4-5 Anterior cervical decompression/discectomy/fusion;  Surgeon: Erline Levine, MD;  Location: [place];  Service: Neurosurgery;  Laterality: N/A;  Cervical 3-4 Cervical 4-5 Anterior cervical decompression/discectomy/fusion  . BREAST LUMPECTOMY WITH RADIOACTIVE SEED LOCALIZATION Right 12/11/2016   Procedure: RIGHT BREAST LUMPECTOMY WITH RADIOACTIVE SEED LOCALIZATION;  Surgeon: Autumn Messing III, MD;  Location: [place];  Service: General;  Laterality: Right;  . CERVICAL LAMINECTOMY    . [place]  . LAPAROSCOPIC RIGHT COLECTOMY Right 03/20/2017   Procedure: LAPAROSCOPIC ASSISTED RIGHT COLECTOMY;  Surgeon: Jovita Kussmaul, MD;  Location: [place];  Service: General;   Laterality: Right;  . LUMBAR FUSION  12/15/2012   Dr Vertell Limber  . melanoma procedures     Dr. Ann Maki & Dr. Delman Cheadle  . TONSILLECTOMY    . TOTAL ABDOMINAL HYSTERECTOMY W/ BILATERAL SALPINGOOPHORECTOMY     Part of one ovary remians    There were no vitals filed for this visit.                    [place] Adult PT Treatment/Exercise - 08/29/19 0001      Knee/Hip Exercises: Aerobic   Recumbent Bike  L2 x 5 minutes       Knee/Hip Exercises: Standing   Heel Raises  20 reps    Heel Raises Limitations  toe raises x 20     Other Standing Knee Exercises  tandem stance, SLS at counter       Knee/Hip Exercises: Seated   Long Arc Quad  20 reps    Sit to [employer]  10 reps      Knee/Hip Exercises: Supine   Bridges  20 reps    Bridges Limitations  one set from heels     Straight Leg Raises  20 reps      Knee/Hip Exercises: Sidelying   Hip ABduction  20 reps    Clams  x 20      Manual Therapy   Neural Stretch  sciatic nerve glide on RLE    pt noted decreased tingling sensation     Ankle Exercises: Stretches   Slant Board Stretch  1 rep;60 seconds      Ankle Exercises: Seated   Other Seated Ankle Exercises  ankle DF with red theraband 2 x 20   needed review, cues for technique      Ankle Exercises: Supine   T-Band  red band DF 20 x 2              PT Education - 08/29/19 1251    Education Details  HEP    Person(s) Educated  Patient    Methods  Explanation;Handout    Comprehension  Verbalized understanding       PT Short Term Goals - 08/15/19 1242      PT SHORT TERM GOAL #1   Title  pt to be I with inital HEP    Time  6    Period  Weeks    Status  New    Target Date  09/26/19        PT Long Term Goals - 08/15/19 1242      PT LONG TERM GOAL #1   Title  pt to increase gross RLE strength to >/= 4+/5 to promote stability and maximize safety with ADLS    Time  6    Period  Weeks    Status  New    Target Date  09/26/19      PT LONG TERM GOAL #2   Title   pt to report decreased tingling / altered sensation in the RLE to </= intermittent for >/= 1-2 weeks for improvement in condition    Time  6    Period  Weeks    Status  New    Target Date  09/26/19      PT LONG TERM GOAL #3   Title  increase FOTO score to </= 25% limited to demo improvement in function    Time  6    Period  Weeks    Status  New    Target Date  09/26/19      PT LONG TERM GOAL #4   Title  pt to be Ind with all HEP given as of last visit to maintain and progress current level of function    Time  6    Period  Weeks    Status  New    Target Date  09/26/19            Plan - 08/29/19 1249    Clinical Impression Statement  Pt arrives reporting no  pain. REviewed HEP and she needs cues to perform correctly. Progressed with balance exercises and updated HEP. No increased pain only fatigue.    PT Next Visit Plan  review/ update HEP, gross RLE strengthening, sciatic nerve glides, gait/ balance training, DF endurance training    PT Home Exercise Plan  standing/ seated ankle DF, sciatic nerve glides, bridge, sidelying hip abduction, sidelying hip ER, tandem and SLS at counter       Patient will benefit from skilled therapeutic intervention in order to improve the following deficits and impairments:  Decreased strength, Abnormal gait, Decreased balance, Decreased activity tolerance, Decreased endurance, Improper body mechanics, Impaired sensation  Visit Diagnosis: Muscle weakness (generalized)  Other disturbances of skin sensation     Problem List Patient Active Problem List   Diagnosis Date Noted  . Cervical myelopathy ([place]) 12/23/2018  . High grade dysplasia in  colonic adenoma 02/26/2018  . Villous adenoma of right colon 03/20/2017  . Osteoporosis 11/20/2016  . Palpitation 04/10/2014  . Osteoarthritis of spine 03/02/2013  . Orthostatic hypotension 03/02/2013  . Hypercholesterolemia 03/25/2011    Dorene Ar, PTA 08/29/2019, 12:51 PM  [facility] [address] [place], [place], [address] Phone: [phone]   Fax:  [phone]  Name: Michelle Martinez MRN: [id number] Date of Birth: 12-Jul-1951

## 2019-08-31 ENCOUNTER — Ambulatory Visit: Payer: Medicare Other | Admitting: Physical Therapy

## 2019-08-31 ENCOUNTER — Encounter: Payer: Self-pay | Admitting: Physical Therapy

## 2019-08-31 ENCOUNTER — Other Ambulatory Visit: Payer: Self-pay

## 2019-08-31 DIAGNOSIS — R208 Other disturbances of skin sensation: Secondary | ICD-10-CM

## 2019-08-31 DIAGNOSIS — M6281 Muscle weakness (generalized): Secondary | ICD-10-CM

## 2019-08-31 NOTE — Therapy (Signed)
[place], [place], [address] Phone: [phone]   Fax:  [phone]  Physical Therapy Treatment  Patient Details  Name: Michelle Martinez MRN: [id number] Date of Birth: 15-Jun-1951 Referring Provider (PT): Melvenia Beam, MD    Encounter Date: 08/31/2019  PT End of Session - 08/31/19 0934    Visit Number  3    Number of Visits  13    Date for PT Re-Evaluation  09/26/19    Authorization Type  MCR: Kx mod at 15th visit, progress note at 10th visit    PT Start Time  0930    PT Stop Time  1010    PT Time Calculation (min)  40 min    Activity Tolerance  Patient tolerated treatment well    Behavior During Therapy  [facility] for tasks assessed/performed       Past Medical History:  Diagnosis Date  . Anxiety   . Arthritis    hands & back - OA  . Complication of anesthesia    HYPOTENSION , DIFFICULTY WAKING UP   . Depression   . Dysrhythmia    OCC RAPID HEART BEAT  . Fatigue   . GERD (gastroesophageal reflux disease)   . Headache(784.0)    ocasonall sinus hedache  . Hypotension   . Melanoma ([place])   . Osteoporosis   . Palpitation   . PONV (postoperative nausea and vomiting)   . Syncope    SEES DR Mare Ferrari     Past Surgical History:  Procedure Laterality Date  . ANTERIOR CERVICAL DECOMP/DISCECTOMY FUSION N/A 12/23/2018   Procedure: Cervical 3-4 Cervical 4-5 Anterior cervical decompression/discectomy/fusion;  Surgeon: Erline Levine, MD;  Location: [place];  Service: Neurosurgery;  Laterality: N/A;  Cervical 3-4 Cervical 4-5 Anterior cervical decompression/discectomy/fusion  . BREAST LUMPECTOMY WITH RADIOACTIVE SEED LOCALIZATION Right 12/11/2016   Procedure: RIGHT BREAST LUMPECTOMY WITH RADIOACTIVE SEED LOCALIZATION;  Surgeon: Autumn Messing III, MD;  Location: [place];  Service: General;  Laterality: Right;  . CERVICAL LAMINECTOMY    . [place]  . LAPAROSCOPIC RIGHT COLECTOMY Right 03/20/2017    Procedure: LAPAROSCOPIC ASSISTED RIGHT COLECTOMY;  Surgeon: Jovita Kussmaul, MD;  Location: [place];  Service: General;  Laterality: Right;  . LUMBAR FUSION  12/15/2012   Dr Vertell Limber  . melanoma procedures     Dr. Ann Maki & Dr. Delman Cheadle  . TONSILLECTOMY    . TOTAL ABDOMINAL HYSTERECTOMY W/ BILATERAL SALPINGOOPHORECTOMY     Part of one ovary remians    There were no vitals filed for this visit.  Subjective Assessment - 08/31/19 0932    Subjective  Pt. reports some muscle soreness from her last therapy session but "feels like a good thing". No new complaints/concerns otherwise this AM.    Currently in Pain?  No/denies         [facility] PT Assessment - 08/31/19 0001      Strength   Right Hip Flexion  4-/5    Right Knee Flexion  4/5    Right Knee Extension  4/5                   [facility] Adult PT Treatment/Exercise - 08/31/19 0001      Knee/Hip Exercises: Stretches   Other Knee/Hip Stretches  slant board stretch 20 sec x 3      Knee/Hip Exercises: Aerobic   Recumbent Bike  L2 x 5 minutes       Knee/Hip Exercises: Standing  Heel Raises  20 reps    Heel Raises Limitations  toe raises x 20     Forward Step Up  Right;Left;15 reps;Hand Hold: 2;Step Height: 4"    Rocker Board  2 minutes    Rocker Board Limitations  dynamic balance lateral shifts on blue board, fw/rev on wooden board    SLS  3x10 sec ea. bilat. on [employer] pad      Knee/Hip Exercises: Seated   Long Arc Quad  AROM;Strengthening;Both;20 reps    Long Arc Quad Weight  2 lbs.    Sit to [employer]  10 reps      Knee/Hip Exercises: Supine   Bridges  20 reps    Bridges Limitations  legs on bolster    Straight Leg Raises  20 reps    Other Supine Knee/Hip Exercises  clamshell red band 2x10      Knee/Hip Exercises: Sidelying   Hip ABduction  AROM;Strengthening;Both;2 sets;10 reps      Manual Therapy   Neural Stretch  sciatic nerve glide on RLE    pt noted decreased tingling sensation     Ankle Exercises:  Stretches   Slant Board Stretch  --   see under hip/knee stretches     Ankle Exercises: Supine   T-Band  yellow band DF x20   yellow band due to muscle fatigue            PT Education - 08/31/19 0934    Education Details  exercises, POC    Person(s) Educated  Patient    Methods  Explanation;Demonstration;Verbal cues    Comprehension  Verbalized understanding;Returned demonstration       PT Short Term Goals - 08/15/19 1242      PT SHORT TERM GOAL #1   Title  pt to be I with inital HEP    Time  6    Period  Weeks    Status  New    Target Date  09/26/19        PT Long Term Goals - 08/31/19 1002      PT LONG TERM GOAL #1   Title  pt to increase gross RLE strength to >/= 4+/5 to promote stability and maximize safety with ADLS    Baseline  hip flexion4-/5, knee 4/5, ankle not tested today    Time  6    Period  Weeks    Status  On-going      PT LONG TERM GOAL #2   Title  pt to report decreased tingling / altered sensation in the RLE to </= intermittent for >/= 1-2 weeks for improvement in condition    Baseline  mild improvement from baseline but continues with parasthesias    Time  6    Period  Weeks    Status  On-going      PT LONG TERM GOAL #3   Title  increase FOTO score to </= 25% limited to demo improvement in function    Baseline  not retested    Time  6    Period  Weeks      PT LONG TERM GOAL #4   Title  pt to be Ind with all HEP given as of last visit to maintain and progress current level of function    Baseline  ongoing    Time  6    Period  Weeks    Status  On-going            Plan - 08/31/19 1000  Clinical Impression Statement  Continues previous tx. emphasis on LE strengthening as well as balance activities. Soreness as noted in subjective consistent with delayed onset muscle soreness. Still with right>left LE weakness but given etiology deficits expect progress will be gradual/take more time.    Personal Factors and Comorbidities   Comorbidity 3+    Comorbidities  hx or anxiety, depression, and cancer    Stability/Clinical Decision Making  Evolving/Moderate complexity    Clinical Decision Making  Moderate    Rehab Potential  Good    PT Frequency  2x / week    PT Duration  6 weeks    PT Treatment/Interventions  ADLs/Self Care Home Management;Cryotherapy;Electrical Stimulation;Moist Heat;Iontophoresis 4mg /ml Dexamethasone;Ultrasound;Gait training;Therapeutic activities;Therapeutic exercise;Balance training;Neuromuscular re-education;Manual techniques;Passive range of motion;Dry needling;Taping;Patient/family education    PT Next Visit Plan  gross RLE strengthening, sciatic nerve glides, gait/ balance training, DF endurance training    PT Home Exercise Plan  standing/ seated ankle DF, sciatic nerve glides, bridge, sidelying hip abduction, sidelying hip ER, tandem and SLS at counter    Consulted and Agree with Plan of Care  Patient       Patient will benefit from skilled therapeutic intervention in order to improve the following deficits and impairments:  Decreased strength, Abnormal gait, Decreased balance, Decreased activity tolerance, Decreased endurance, Improper body mechanics, Impaired sensation  Visit Diagnosis: Muscle weakness (generalized)  Other disturbances of skin sensation     Problem List Patient Active Problem List   Diagnosis Date Noted  . Cervical myelopathy ([place]) 12/23/2018  . 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 Villous adenoma of right colon 03/20/2017  . Osteoporosis 11/20/2016  . Palpitation 04/10/2014  . Osteoarthritis of spine 03/02/2013  . Orthostatic hypotension 03/02/2013  . Hypercholesterolemia 03/25/2011    Beaulah Dinning, PT, DPT 08/31/19 10:12 AM  [facility] [address] [place], [place], [address] Phone: [phone]   Fax:  [phone]  Name: Michelle Martinez MRN: [id number] Date of Birth:  02/18/1951

## 2019-09-05 ENCOUNTER — Encounter: Payer: Self-pay | Admitting: Physical Therapy

## 2019-09-05 ENCOUNTER — Ambulatory Visit: Payer: Medicare Other | Admitting: Physical Therapy

## 2019-09-05 ENCOUNTER — Other Ambulatory Visit: Payer: Self-pay

## 2019-09-05 DIAGNOSIS — M6281 Muscle weakness (generalized): Secondary | ICD-10-CM

## 2019-09-05 DIAGNOSIS — R208 Other disturbances of skin sensation: Secondary | ICD-10-CM

## 2019-09-05 NOTE — Therapy (Signed)
[place], [place], [address] Phone: [phone]   Fax:  [phone]  Physical Therapy Treatment  Patient Details  Name: Michelle Martinez MRN: [id number] Date of Birth: 20-Feb-1951 Referring Provider (PT): Melvenia Beam, MD    Encounter Date: 09/05/2019  PT End of Session - 09/05/19 0935    Visit Number  4    Number of Visits  13    Date for PT Re-Evaluation  09/26/19    Authorization Type  MCR: Kx mod at 15th visit, progress note at 10th visit    PT Start Time  0930    PT Stop Time  1012    PT Time Calculation (min)  42 min       Past Medical History:  Diagnosis Date  . Anxiety   . Arthritis    hands & back - OA  . Complication of anesthesia    HYPOTENSION , DIFFICULTY WAKING UP   . Depression   . Dysrhythmia    OCC RAPID HEART BEAT  . Fatigue   . GERD (gastroesophageal reflux disease)   . Headache(784.0)    ocasonall sinus hedache  . Hypotension   . Melanoma ([place])   . Osteoporosis   . Palpitation   . PONV (postoperative nausea and vomiting)   . Syncope    SEES DR Mare Ferrari     Past Surgical History:  Procedure Laterality Date  . ANTERIOR CERVICAL DECOMP/DISCECTOMY FUSION N/A 12/23/2018   Procedure: Cervical 3-4 Cervical 4-5 Anterior cervical decompression/discectomy/fusion;  Surgeon: Erline Levine, MD;  Location: [place];  Service: Neurosurgery;  Laterality: N/A;  Cervical 3-4 Cervical 4-5 Anterior cervical decompression/discectomy/fusion  . BREAST LUMPECTOMY WITH RADIOACTIVE SEED LOCALIZATION Right 12/11/2016   Procedure: RIGHT BREAST LUMPECTOMY WITH RADIOACTIVE SEED LOCALIZATION;  Surgeon: Autumn Messing III, MD;  Location: [place];  Service: General;  Laterality: Right;  . CERVICAL LAMINECTOMY    . [place]  . LAPAROSCOPIC RIGHT COLECTOMY Right 03/20/2017   Procedure: LAPAROSCOPIC ASSISTED RIGHT COLECTOMY;  Surgeon: Jovita Kussmaul, MD;  Location: [place];  Service:  General;  Laterality: Right;  . LUMBAR FUSION  12/15/2012   Dr Vertell Limber  . melanoma procedures     Dr. Ann Maki & Dr. Delman Cheadle  . TONSILLECTOMY    . TOTAL ABDOMINAL HYSTERECTOMY W/ BILATERAL SALPINGOOPHORECTOMY     Part of one ovary remians    There were no vitals filed for this visit.  Subjective Assessment - 09/05/19 0934    Subjective  Pt reports N/T more concentrated in knee. Feels episode of catching foot is less frequent.    Currently in Pain?  No/denies                       [facility] Adult PT Treatment/Exercise - 09/05/19 0001      Knee/Hip Exercises: Stretches   Other Knee/Hip Stretches  slant board stretch 20 sec x 3      Knee/Hip Exercises: Aerobic   Recumbent Bike  L2 x 5 minutes       Knee/Hip Exercises: Machines for Strengthening   Cybex Leg Press  Supine leg press 1 plate x 20 bilateral       Knee/Hip Exercises: Standing   Heel Raises  20 reps    Heel Raises Limitations  toe raises x 20     Lateral Step Up  Right;Left;15 reps;Hand Hold: 1;Step Height: 4"    Forward Step Up  Right;Left;15 reps;Hand Hold:  2;Step Height: 6"    Rocker Board  2 minutes    Rocker Board Limitations  dynamic balance lateral shifts on blue board, fw/rev on wooden board    SLS  20 sec best on green foam bilat       Knee/Hip Exercises: Seated   Sit to Sand  10 reps   2 sets from bariatric chair     Knee/Hip Exercises: Supine   Straight Leg Raises  20 reps      Knee/Hip Exercises: Sidelying   Hip ABduction  20 reps      Manual Therapy   Neural Stretch  sciatic nerve glide on RLE    pt noted decreased tingling sensation     Ankle Exercises: Stretches   Slant Board Stretch  1 rep;60 seconds      Ankle Exercises: Supine   T-Band  red band DF 20 x 2                PT Short Term Goals - 08/15/19 1242      PT SHORT TERM GOAL #1   Title  pt to be I with inital HEP    Time  6    Period  Weeks    Status  New    Target Date  09/26/19        PT Long Term Goals  - 08/31/19 1002      PT LONG TERM GOAL #1   Title  pt to increase gross RLE strength to >/= 4+/5 to promote stability and maximize safety with ADLS    Baseline  hip flexion4-/5, knee 4/5, ankle not tested today    Time  6    Period  Weeks    Status  On-going      PT LONG TERM GOAL #2   Title  pt to report decreased tingling / altered sensation in the RLE to </= intermittent for >/= 1-2 weeks for improvement in condition    Baseline  mild improvement from baseline but continues with parasthesias    Time  6    Period  Weeks    Status  On-going      PT LONG TERM GOAL #3   Title  increase FOTO score to </= 25% limited to demo improvement in function    Baseline  not retested    Time  6    Period  Weeks      PT LONG TERM GOAL #4   Title  pt to be Ind with all HEP given as of last visit to maintain and progress current level of function    Baseline  ongoing    Time  6    Period  Weeks    Status  On-going            Plan - 09/05/19 1014    Clinical Impression Statement  Pt reports less episodes of catching right foot. Continued with strengthening of LEs. Good tolerance to therex. Most fatigue in hip abduction exercise. Improving SLS on unlevel surface.    PT Next Visit Plan  gross RLE strengthening, sciatic nerve glides, gait/ balance training, DF endurance training    PT Home Exercise Plan  standing/ seated ankle DF, sciatic nerve glides, bridge, sidelying hip abduction, sidelying hip ER, tandem and SLS at counter       Patient will benefit from skilled therapeutic intervention in order to improve the following deficits and impairments:  Decreased strength, Abnormal gait, Decreased balance, Decreased activity tolerance, Decreased endurance,  Improper body mechanics, Impaired sensation  Visit Diagnosis: Muscle weakness (generalized)  Other disturbances of skin sensation     Problem List Patient Active Problem List   Diagnosis Date Noted  . Cervical myelopathy ([place])  12/23/2018  . 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 Villous adenoma of right colon 03/20/2017  . Osteoporosis 11/20/2016  . Palpitation 04/10/2014  . Osteoarthritis of spine 03/02/2013  . Orthostatic hypotension 03/02/2013  . Hypercholesterolemia 03/25/2011    Dorene Ar, PTA 09/05/2019, 10:15 AM  [facility] [address] [place], [place], [address] Phone: [phone]   Fax:  [phone]  Name: Michelle Martinez MRN: [id number] Date of Birth: [birth year]

## 2019-09-07 ENCOUNTER — Ambulatory Visit: Payer: Medicare Other | Admitting: Physical Therapy

## 2019-09-12 ENCOUNTER — Encounter: Payer: Self-pay | Admitting: Physical Therapy

## 2019-09-12 ENCOUNTER — Other Ambulatory Visit: Payer: Self-pay

## 2019-09-12 ENCOUNTER — Ambulatory Visit: Payer: Medicare Other | Admitting: Physical Therapy

## 2019-09-12 DIAGNOSIS — R208 Other disturbances of skin sensation: Secondary | ICD-10-CM

## 2019-09-12 DIAGNOSIS — M6281 Muscle weakness (generalized): Secondary | ICD-10-CM

## 2019-09-12 NOTE — Therapy (Addendum)
[place], [place], [address] Phone: [phone]   Fax:  [phone]  Physical Therapy Treatment / discharge  Patient Details  Name: Michelle Martinez MRN: [id number] Date of Birth: July 01, 1951 Referring Provider (PT): Melvenia Beam, MD    Encounter Date: 09/12/2019  PT End of Session - 09/12/19 0940    Visit Number  5    Number of Visits  13    Date for PT Re-Evaluation  09/26/19    Authorization Type  MCR: Kx mod at 15th visit, progress note at 10th visit    PT Start Time  0934    PT Stop Time  1014    PT Time Calculation (min)  40 min       Past Medical History:  Diagnosis Date  . Anxiety   . Arthritis    hands & back - OA  . Complication of anesthesia    HYPOTENSION , DIFFICULTY WAKING UP   . Depression   . Dysrhythmia    OCC RAPID HEART BEAT  . Fatigue   . GERD (gastroesophageal reflux disease)   . Headache(784.0)    ocasonall sinus hedache  . Hypotension   . Melanoma ([place])   . Osteoporosis   . Palpitation   . PONV (postoperative nausea and vomiting)   . Syncope    SEES DR Mare Ferrari     Past Surgical History:  Procedure Laterality Date  . ANTERIOR CERVICAL DECOMP/DISCECTOMY FUSION N/A 12/23/2018   Procedure: Cervical 3-4 Cervical 4-5 Anterior cervical decompression/discectomy/fusion;  Surgeon: Erline Levine, MD;  Location: [place];  Service: Neurosurgery;  Laterality: N/A;  Cervical 3-4 Cervical 4-5 Anterior cervical decompression/discectomy/fusion  . BREAST LUMPECTOMY WITH RADIOACTIVE SEED LOCALIZATION Right 12/11/2016   Procedure: RIGHT BREAST LUMPECTOMY WITH RADIOACTIVE SEED LOCALIZATION;  Surgeon: Autumn Messing III, MD;  Location: [place];  Service: General;  Laterality: Right;  . CERVICAL LAMINECTOMY    . [place]  . LAPAROSCOPIC RIGHT COLECTOMY Right 03/20/2017   Procedure: LAPAROSCOPIC ASSISTED RIGHT COLECTOMY;  Surgeon: Jovita Kussmaul, MD;  Location: [place];   Service: General;  Laterality: Right;  . LUMBAR FUSION  12/15/2012   Dr Vertell Limber  . melanoma procedures     Dr. Ann Maki & Dr. Delman Cheadle  . TONSILLECTOMY    . TOTAL ABDOMINAL HYSTERECTOMY W/ BILATERAL SALPINGOOPHORECTOMY     Part of one ovary remians    There were no vitals filed for this visit.                    [place] Adult PT Treatment/Exercise - 09/12/19 0001      Knee/Hip Exercises: Aerobic   Recumbent Bike  L2 x 5 minutes       Knee/Hip Exercises: Machines for Strengthening   Cybex Leg Press  Supine leg press 1 plate x 20 bilateral , 1.5 plates x 20      Knee/Hip Exercises: Standing   Heel Raises  20 reps    Heel Raises Limitations  toe raises x 20     Forward Step Up  Right;Left;15 reps;Hand Hold: 2;Step Height: 8"    Rocker Board Limitations  dynamic balance lateral shifts on blue board, fw/rev on wooden board    Other Standing Knee Exercises  tandem stance, SLS at counter       Knee/Hip Exercises: Seated   Sit to Sand  10 reps   2 sets from standard chair     Knee/Hip Exercises: Supine  Quad Sets  10 reps    Bridges  20 reps    Bridges Limitations  one set from heels     Straight Leg Raises  20 reps    Straight Leg Raises Limitations  with abdominal draw in cues     Other Supine Knee/Hip Exercises  clamshell green band 2x10      Knee/Hip Exercises: Sidelying   Hip ABduction  20 reps      Ankle Exercises: Stretches   Slant Board Stretch  --      Ankle Exercises: Seated   Other Seated Ankle Exercises  ankle DF green band x 20 RLE               PT Short Term Goals - 08/15/19 1242      PT SHORT TERM GOAL #1   Title  pt to be I with inital HEP    Time  6    Period  Weeks    Status  New    Target Date  09/26/19        PT Long Term Goals - 08/31/19 1002      PT LONG TERM GOAL #1   Title  pt to increase gross RLE strength to >/= 4+/5 to promote stability and maximize safety with ADLS    Baseline  hip flexion4-/5, knee 4/5, ankle not  tested today    Time  6    Period  Weeks    Status  On-going      PT LONG TERM GOAL #2   Title  pt to report decreased tingling / altered sensation in the RLE to </= intermittent for >/= 1-2 weeks for improvement in condition    Baseline  mild improvement from baseline but continues with parasthesias    Time  6    Period  Weeks    Status  On-going      PT LONG TERM GOAL #3   Title  increase FOTO score to </= 25% limited to demo improvement in function    Baseline  not retested    Time  6    Period  Weeks      PT LONG TERM GOAL #4   Title  pt to be Ind with all HEP given as of last visit to maintain and progress current level of function    Baseline  ongoing    Time  6    Period  Weeks    Status  On-going            Plan - 09/12/19 1006    Clinical Impression Statement  Pt reports doing well except she notices right knee numbness. She reports no episodes of catching her toe lately. Continued with LE strenthening and balance.    PT Next Visit Plan  gross RLE strengthening, sciatic nerve glides, gait/ balance training, DF endurance training    PT Home Exercise Plan  standing/ seated ankle DF, sciatic nerve glides, bridge, sidelying hip abduction, sidelying hip ER, tandem and SLS at counter       Patient will benefit from skilled therapeutic intervention in order to improve the following deficits and impairments:  Decreased strength, Abnormal gait, Decreased balance, Decreased activity tolerance, Decreased endurance, Improper body mechanics, Impaired sensation  Visit Diagnosis: Muscle weakness (generalized)  Other disturbances of skin sensation     Problem List Patient Active Problem List   Diagnosis Date Noted  . Cervical myelopathy ([place]) 12/23/2018  . 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  Villous adenoma of right colon 03/20/2017  . Osteoporosis 11/20/2016  . Palpitation 04/10/2014  . Osteoarthritis of spine 03/02/2013  . Orthostatic hypotension  03/02/2013  . Hypercholesterolemia 03/25/2011    Dorene Ar , PTA 09/12/2019, 10:54 AM  [facility] [address] [place], [place], [address] Phone: [phone]   Fax:  [phone]  Name: Michelle Martinez MRN: [id number] Date of Birth: 06/26/1951      PHYSICAL THERAPY DISCHARGE SUMMARY  Visits from Start of Care: 5  Current functional level related to goals / functional outcomes: See goals   Remaining deficits: Pt requested to cancel her appointments secondary to concerns with COVID.   Education / Equipment: HEP,theraband, posture  Plan: Patient agrees to discharge.  Patient goals were partially met. Patient is being discharged due to the patient's request.  ?????         Kristoffer Leamon PT, DPT, LAT, ATC  09/28/19  10:23 AM

## 2019-09-14 ENCOUNTER — Ambulatory Visit: Payer: Medicare Other | Admitting: Physical Therapy

## 2019-09-19 ENCOUNTER — Ambulatory Visit: Payer: Medicare Other | Admitting: Physical Therapy

## 2019-09-21 ENCOUNTER — Ambulatory Visit: Payer: Medicare Other | Admitting: Physical Therapy

## 2019-11-23 ENCOUNTER — Other Ambulatory Visit: Payer: Self-pay

## 2019-11-25 ENCOUNTER — Other Ambulatory Visit: Payer: Self-pay

## 2019-11-25 ENCOUNTER — Encounter: Payer: Self-pay | Admitting: Obstetrics & Gynecology

## 2019-11-25 ENCOUNTER — Ambulatory Visit (INDEPENDENT_AMBULATORY_CARE_PROVIDER_SITE_OTHER): Payer: Medicare PPO | Admitting: Obstetrics & Gynecology

## 2019-11-25 VITALS — BP 102/60 | HR 80 | Temp 97.0°F | Resp 10 | Ht 62.0 in | Wt 128.0 lb

## 2019-11-25 DIAGNOSIS — N949 Unspecified condition associated with female genital organs and menstrual cycle: Secondary | ICD-10-CM

## 2019-11-25 DIAGNOSIS — M810 Age-related osteoporosis without current pathological fracture: Secondary | ICD-10-CM

## 2019-11-25 DIAGNOSIS — Z01419 Encounter for gynecological examination (general) (routine) without abnormal findings: Secondary | ICD-10-CM | POA: Diagnosis not present

## 2019-11-25 MED ORDER — BUPROPION HCL ER (XL) 150 MG PO TB24: ORAL_TABLET | ORAL | 4 refills | Status: AC

## 2019-11-25 MED ORDER — DULOXETINE HCL 60 MG PO CPEP: ORAL_CAPSULE | ORAL | 4 refills | Status: AC

## 2019-11-25 NOTE — Progress Notes (Signed)
69 y.o. [id number] Widowed White or Caucasian female here for annual exam.  Denies vaginal bleeding.  Having some vaginal dryness with some burning sensation.  Concerned about memory.  Saw Dr. Inda Merlin yesterday and did some basic testing.  Has follow up scheduled.    No LMP recorded. Patient has had a hysterectomy.          Sexually active: No.  The current method of family planning is status post hysterectomy.    Exercising: Yes.    walking Smoker:  no  Health Maintenance: Pap:  Years ago History of abnormal Pap:  no MMG:  12/11/16 Right breast lumpectomy  Colonoscopy:  03/20/17 Right Colectomy, colonoscopy 03/24/2018 with Dr. Collene Mares, follow-up 5 years BMD:   10/28/16 Osteoporosis  TDaP:  2014 Pneumonia vaccine(s):  Prevnar 2018 Shingrix:   completed Hep C testing: 11/20/16 Neg Screening Labs: Neurology   reports that she quit smoking about 43 years ago. She has never used smokeless tobacco. She reports current alcohol use of about 7.0 standard drinks of alcohol per week. She reports that she does not use drugs.  Past Medical History:  Diagnosis Date  . Anxiety   . Arthritis    hands & back - OA  . Complication of anesthesia    HYPOTENSION , DIFFICULTY WAKING UP   . Depression   . Dysrhythmia    OCC RAPID HEART BEAT  . Fatigue   . GERD (gastroesophageal reflux disease)   . Headache(784.0)    ocasonall sinus hedache  . Hypotension   . Melanoma ([place])   . Osteoporosis   . Palpitation   . PONV (postoperative nausea and vomiting)   . Syncope    SEES DR Mare Ferrari     Past Surgical History:  Procedure Laterality Date  . ANTERIOR CERVICAL DECOMP/DISCECTOMY FUSION N/A 12/23/2018   Procedure: Cervical 3-4 Cervical 4-5 Anterior cervical decompression/discectomy/fusion;  Surgeon: Erline Levine, MD;  Location: [facility];  Service: Neurosurgery;  Laterality: N/A;  Cervical 3-4 Cervical 4-5 Anterior cervical decompression/discectomy/fusion  . BREAST LUMPECTOMY WITH RADIOACTIVE SEED LOCALIZATION Right  12/11/2016   Procedure: RIGHT BREAST LUMPECTOMY WITH RADIOACTIVE SEED LOCALIZATION;  Surgeon: Autumn Messing III, MD;  Location: [facility];  Service: General;  Laterality: Right;  . CERVICAL LAMINECTOMY    . [place]  . LAPAROSCOPIC RIGHT COLECTOMY Right 03/20/2017   Procedure: LAPAROSCOPIC ASSISTED RIGHT COLECTOMY;  Surgeon: Jovita Kussmaul, MD;  Location: [facility];  Service: General;  Laterality: Right;  . LUMBAR FUSION  12/15/2012   Dr Vertell Limber  . melanoma procedures     Dr. Ann Maki & Dr. Delman Cheadle  . TONSILLECTOMY    . TOTAL ABDOMINAL HYSTERECTOMY W/ BILATERAL SALPINGOOPHORECTOMY     Part of one ovary remians    Current Outpatient Medications  Medication Sig Dispense Refill  . buPROPion (WELLBUTRIN XL) 150 MG 24 hr tablet TAKE 1 TABLET(150 MG) BY MOUTH DAILY 90 tablet 1  . Cholecalciferol (VITAMIN D3 PO) Take 25 mg by mouth daily.    . Diclofenac-miSOPROStol 75-0.2 MG TBEC TK 1 T PO QD    . diphenhydrAMINE (BENADRYL) 25 MG tablet Take 25 mg by mouth every 8 (eight) hours as needed for itching or allergies.     . DULoxetine (CYMBALTA) 60 MG capsule TAKE 1 CAPSULE(60 MG) BY MOUTH DAILY 90 capsule 1  . estradiol (ESTRACE) 0.1 MG/GM vaginal cream 1 gram vaginally twice weekly 42.5 g 3  . famotidine (PEPCID) 20 MG tablet Take 20 mg by mouth daily as needed  for heartburn or indigestion.    . fluticasone (FLONASE) 50 MCG/ACT nasal spray Place 2 sprays into both nostrils every morning.    . hydrocortisone (ANUSOL-HC) 2.5 % rectal cream Place 1 application rectally 2 (two) times daily. 30 g 1  . OVER THE COUNTER MEDICATION 1 each daily. Turmeric/Curcumin x 285    . OVER THE COUNTER MEDICATION as needed. Tylenol Sinus    . Pseudoephedrine HCl (SUDAFED PO) Take by mouth as needed.    . psyllium (METAMUCIL) 58.6 % powder Take 1 packet by mouth daily.    . zoledronic acid (RECLAST) 5 MG/100ML SOLN injection Inject 5 mg into the vein. annually    . NONFORMULARY OR COMPOUNDED ITEM Estradiol 0.02%  cream with Vit E.  Place 19ml vaginal and small amount externally three times weekly.  Disp:  3 month supply.  #4 RF. (Patient not taking: Reported on 11/25/2019) 36 each 4   No current facility-administered medications for this visit.    Family History  Problem Relation Age of Onset  . Cancer Mother   . Other Mother        Glioblastoma  . Heart attack Father   . Cancer Father        prostate  . Dementia Father        late 10s    Review of Systems  All other systems reviewed and are negative.   Exam:   BP 102/60 (BP Location: Right Arm, Patient Position: Sitting, Cuff Size: Normal)   Pulse 80   Temp (!) 97 F (36.1 C) (Temporal)   Resp 10   Ht 5\' 2"  (1.575 m)   Wt 128 lb (58.1 kg)   BMI 23.41 kg/m   Height: 5\' 2"  (157.5 cm)  Ht Readings from Last 3 Encounters:  11/25/19 5\' 2"  (1.575 m)  07/20/19 5\' 3"  (1.6 m)  12/23/18 5' 2.5" (1.588 m)    General appearance: alert, cooperative and appears stated age Head: Normocephalic, without obvious abnormality, atraumatic Neck: no adenopathy, supple, symmetrical, trachea midline and thyroid normal to inspection and palpation Lungs: clear to auscultation bilaterally Breasts: normal appearance, no masses or tenderness Heart: regular rate and rhythm Abdomen: soft, non-tender; bowel sounds normal; no masses,  no organomegaly Extremities: extremities normal, atraumatic, no cyanosis or edema Skin: Skin color, texture, turgor normal. No rashes or lesions Lymph nodes: Cervical, supraclavicular, and axillary nodes normal. No abnormal inguinal nodes palpated Neurologic: Grossly normal   Pelvic: External genitalia:  no lesions              Urethra:  normal appearing urethra with no masses, tenderness or lesions              Bartholins and Skenes: normal                 Vagina: normal appearing vagina with normal color and discharge, no lesions              Cervix: absent              Pap taken: No. Bimanual Exam:  Uterus:  uterus  absent              Adnexa: no mass, fullness, tenderness               Rectovaginal: Confirms               Anus:  normal sphincter tone, no lesions  Chaperone, Terence Lux, CMA, was present for exam.  A:  Well  Woman with normal exam PMP, no HRT OAB Vaginal atrophic changes, burning H/O TAH/USO, later partial other ovary removal H/O high grade colon adenoma s/p partial colectomy Osteoporosis Memory changes  P:   Mammogram is due.  Pt will schedule. Order for BMD placed.  She will schedule this as well Has follow up with Dr. Inda Merlin about her memory pap smear not indicated RF for Wellbutrin 150mg  XL daily . #90/4RF and cymbalta 60mg  daily.  #90/4RF return annually or prn

## 2019-11-25 NOTE — Patient Instructions (Addendum)
It's time to scheduled your mammogram and bone density.  The last mammogram and bone density was in 2018.  You've been doing these at the [facility].  Schedule your appointment by calling [phone].  Please check with Dr. Inda Merlin about your pneumonia vaccinations.  Have you done both?  My records only show prevnar given in 2018

## 2019-11-25 NOTE — Addendum Note (Signed)
Addended by: Terence Lux A on: 11/25/2019 03:40 PM   Modules accepted: Orders

## 2019-11-26 LAB — VAGINITIS/VAGINOSIS, DNA PROBE
Candida Species: NEGATIVE
Gardnerella vaginalis: NEGATIVE
Trichomonas vaginosis: NEGATIVE

## 2019-11-27 ENCOUNTER — Ambulatory Visit: Payer: Medicare PPO

## 2019-12-13 ENCOUNTER — Ambulatory Visit: Payer: Medicare Other

## 2020-01-15 ENCOUNTER — Other Ambulatory Visit: Payer: Self-pay | Admitting: Obstetrics & Gynecology

## 2020-01-16 NOTE — Telephone Encounter (Signed)
Medication refill request: Wellbutrin 150 mg  Last AEX:  11/25/2019 with MM Next AEX: 02/22/2021 Last MMG (if hormonal medication request):n/a Refill authorized: Today #90,w 4 RF pended   Rx denied last given 11/25/2019 for a year.

## 2020-01-18 ENCOUNTER — Telehealth: Payer: Self-pay | Admitting: *Deleted

## 2020-01-18 NOTE — Telephone Encounter (Signed)
Spoke with patient regarding refill. Prescription sent to pharmacy 11/25/19 for #90 w/4 refills. Patient states that she no longer needs refill. Okay to close encounter.

## 2020-01-18 NOTE — Telephone Encounter (Signed)
Patient requesting refill on bupropion. [facility] at [phone].

## 2020-01-25 ENCOUNTER — Ambulatory Visit: Payer: Self-pay | Admitting: Neurology

## 2020-03-28 DIAGNOSIS — Z86018 Personal history of other benign neoplasm: Secondary | ICD-10-CM | POA: Diagnosis not present

## 2020-03-28 DIAGNOSIS — L570 Actinic keratosis: Secondary | ICD-10-CM | POA: Diagnosis not present

## 2020-03-28 DIAGNOSIS — Z85828 Personal history of other malignant neoplasm of skin: Secondary | ICD-10-CM | POA: Diagnosis not present

## 2020-03-28 DIAGNOSIS — Z86008 Personal history of in-situ neoplasm of other site: Secondary | ICD-10-CM | POA: Diagnosis not present

## 2020-03-28 DIAGNOSIS — Z23 Encounter for immunization: Secondary | ICD-10-CM | POA: Diagnosis not present

## 2020-03-28 DIAGNOSIS — D2271 Melanocytic nevi of right lower limb, including hip: Secondary | ICD-10-CM | POA: Diagnosis not present

## 2020-03-28 DIAGNOSIS — D2261 Melanocytic nevi of right upper limb, including shoulder: Secondary | ICD-10-CM | POA: Diagnosis not present

## 2020-03-28 DIAGNOSIS — D2230 Melanocytic nevi of unspecified part of face: Secondary | ICD-10-CM | POA: Diagnosis not present

## 2020-03-28 DIAGNOSIS — D2272 Melanocytic nevi of left lower limb, including hip: Secondary | ICD-10-CM | POA: Diagnosis not present

## 2020-07-06 DIAGNOSIS — Z79899 Other long term (current) drug therapy: Secondary | ICD-10-CM | POA: Diagnosis not present

## 2020-07-06 DIAGNOSIS — R61 Generalized hyperhidrosis: Secondary | ICD-10-CM | POA: Diagnosis not present

## 2020-07-06 DIAGNOSIS — F3342 Major depressive disorder, recurrent, in full remission: Secondary | ICD-10-CM | POA: Diagnosis not present

## 2020-07-06 DIAGNOSIS — Z23 Encounter for immunization: Secondary | ICD-10-CM | POA: Diagnosis not present

## 2020-08-09 DIAGNOSIS — L578 Other skin changes due to chronic exposure to nonionizing radiation: Secondary | ICD-10-CM | POA: Diagnosis not present

## 2020-08-09 DIAGNOSIS — L570 Actinic keratosis: Secondary | ICD-10-CM | POA: Diagnosis not present

## 2020-08-09 DIAGNOSIS — D3610 Benign neoplasm of peripheral nerves and autonomic nervous system, unspecified: Secondary | ICD-10-CM | POA: Diagnosis not present

## 2020-09-05 DIAGNOSIS — F3342 Major depressive disorder, recurrent, in full remission: Secondary | ICD-10-CM | POA: Diagnosis not present

## 2020-09-05 DIAGNOSIS — R946 Abnormal results of thyroid function studies: Secondary | ICD-10-CM | POA: Diagnosis not present

## 2020-09-05 DIAGNOSIS — R61 Generalized hyperhidrosis: Secondary | ICD-10-CM | POA: Diagnosis not present

## 2020-09-05 DIAGNOSIS — R7989 Other specified abnormal findings of blood chemistry: Secondary | ICD-10-CM | POA: Diagnosis not present

## 2020-09-05 DIAGNOSIS — Z79899 Other long term (current) drug therapy: Secondary | ICD-10-CM | POA: Diagnosis not present

## 2020-09-05 DIAGNOSIS — K591 Functional diarrhea: Secondary | ICD-10-CM | POA: Diagnosis not present

## 2020-09-26 DIAGNOSIS — L821 Other seborrheic keratosis: Secondary | ICD-10-CM | POA: Diagnosis not present

## 2020-09-26 DIAGNOSIS — D2272 Melanocytic nevi of left lower limb, including hip: Secondary | ICD-10-CM | POA: Diagnosis not present

## 2020-09-26 DIAGNOSIS — Z86008 Personal history of in-situ neoplasm of other site: Secondary | ICD-10-CM | POA: Diagnosis not present

## 2020-09-26 DIAGNOSIS — D2261 Melanocytic nevi of right upper limb, including shoulder: Secondary | ICD-10-CM | POA: Diagnosis not present

## 2020-09-26 DIAGNOSIS — Z85828 Personal history of other malignant neoplasm of skin: Secondary | ICD-10-CM | POA: Diagnosis not present

## 2020-09-26 DIAGNOSIS — Z86018 Personal history of other benign neoplasm: Secondary | ICD-10-CM | POA: Diagnosis not present

## 2020-09-26 DIAGNOSIS — D2230 Melanocytic nevi of unspecified part of face: Secondary | ICD-10-CM | POA: Diagnosis not present

## 2020-09-26 DIAGNOSIS — L578 Other skin changes due to chronic exposure to nonionizing radiation: Secondary | ICD-10-CM | POA: Diagnosis not present

## 2020-09-26 DIAGNOSIS — D2271 Melanocytic nevi of right lower limb, including hip: Secondary | ICD-10-CM | POA: Diagnosis not present

## 2020-10-25 DIAGNOSIS — R413 Other amnesia: Secondary | ICD-10-CM | POA: Diagnosis not present

## 2020-10-25 DIAGNOSIS — M810 Age-related osteoporosis without current pathological fracture: Secondary | ICD-10-CM | POA: Diagnosis not present

## 2020-10-25 DIAGNOSIS — H81312 Aural vertigo, left ear: Secondary | ICD-10-CM | POA: Diagnosis not present

## 2020-10-25 DIAGNOSIS — Z79899 Other long term (current) drug therapy: Secondary | ICD-10-CM | POA: Diagnosis not present

## 2020-10-25 DIAGNOSIS — E559 Vitamin D deficiency, unspecified: Secondary | ICD-10-CM | POA: Diagnosis not present

## 2020-10-25 DIAGNOSIS — F418 Other specified anxiety disorders: Secondary | ICD-10-CM | POA: Diagnosis not present

## 2020-10-25 DIAGNOSIS — F3342 Major depressive disorder, recurrent, in full remission: Secondary | ICD-10-CM | POA: Diagnosis not present

## 2020-11-08 DIAGNOSIS — M810 Age-related osteoporosis without current pathological fracture: Secondary | ICD-10-CM | POA: Diagnosis not present

## 2020-11-08 DIAGNOSIS — E559 Vitamin D deficiency, unspecified: Secondary | ICD-10-CM | POA: Diagnosis not present

## 2020-11-08 DIAGNOSIS — R413 Other amnesia: Secondary | ICD-10-CM | POA: Diagnosis not present

## 2020-11-08 DIAGNOSIS — W19XXXA Unspecified fall, initial encounter: Secondary | ICD-10-CM | POA: Diagnosis not present

## 2020-11-08 DIAGNOSIS — H81312 Aural vertigo, left ear: Secondary | ICD-10-CM | POA: Diagnosis not present

## 2020-11-08 DIAGNOSIS — F3342 Major depressive disorder, recurrent, in full remission: Secondary | ICD-10-CM | POA: Diagnosis not present

## 2020-11-08 DIAGNOSIS — M25531 Pain in right wrist: Secondary | ICD-10-CM | POA: Diagnosis not present

## 2020-11-08 DIAGNOSIS — M79641 Pain in right hand: Secondary | ICD-10-CM | POA: Diagnosis not present

## 2020-11-08 DIAGNOSIS — F418 Other specified anxiety disorders: Secondary | ICD-10-CM | POA: Diagnosis not present

## 2020-11-08 DIAGNOSIS — M79631 Pain in right forearm: Secondary | ICD-10-CM | POA: Diagnosis not present

## 2020-11-15 DIAGNOSIS — S52501A Unspecified fracture of the lower end of right radius, initial encounter for closed fracture: Secondary | ICD-10-CM

## 2020-11-15 DIAGNOSIS — M25531 Pain in right wrist: Secondary | ICD-10-CM | POA: Diagnosis not present

## 2020-11-15 DIAGNOSIS — S52591D Other fractures of lower end of right radius, subsequent encounter for closed fracture with routine healing: Secondary | ICD-10-CM | POA: Diagnosis not present

## 2020-11-15 HISTORY — DX: Unspecified fracture of the lower end of right radius, initial encounter for closed fracture: S52.501A

## 2020-11-24 ENCOUNTER — Other Ambulatory Visit: Payer: Self-pay | Admitting: Obstetrics & Gynecology

## 2020-12-06 DIAGNOSIS — S52591D Other fractures of lower end of right radius, subsequent encounter for closed fracture with routine healing: Secondary | ICD-10-CM | POA: Diagnosis not present

## 2020-12-10 DIAGNOSIS — Z0001 Encounter for general adult medical examination with abnormal findings: Secondary | ICD-10-CM | POA: Diagnosis not present

## 2020-12-10 DIAGNOSIS — R946 Abnormal results of thyroid function studies: Secondary | ICD-10-CM | POA: Diagnosis not present

## 2020-12-10 DIAGNOSIS — W19XXXA Unspecified fall, initial encounter: Secondary | ICD-10-CM | POA: Diagnosis not present

## 2020-12-10 DIAGNOSIS — E7800 Pure hypercholesterolemia, unspecified: Secondary | ICD-10-CM | POA: Diagnosis not present

## 2020-12-10 DIAGNOSIS — H81312 Aural vertigo, left ear: Secondary | ICD-10-CM | POA: Diagnosis not present

## 2020-12-10 DIAGNOSIS — F418 Other specified anxiety disorders: Secondary | ICD-10-CM | POA: Diagnosis not present

## 2020-12-10 DIAGNOSIS — M25531 Pain in right wrist: Secondary | ICD-10-CM | POA: Diagnosis not present

## 2020-12-31 DIAGNOSIS — S52591D Other fractures of lower end of right radius, subsequent encounter for closed fracture with routine healing: Secondary | ICD-10-CM | POA: Diagnosis not present

## 2021-01-19 ENCOUNTER — Encounter (HOSPITAL_BASED_OUTPATIENT_CLINIC_OR_DEPARTMENT_OTHER): Payer: Self-pay | Admitting: *Deleted

## 2021-01-19 ENCOUNTER — Emergency Department (HOSPITAL_BASED_OUTPATIENT_CLINIC_OR_DEPARTMENT_OTHER)
Admission: EM | Admit: 2021-01-19 | Discharge: 2021-01-19 | Disposition: A | Discharge status: Home / Self Care | Payer: Medicare PPO | Attending: Emergency Medicine | Admitting: Emergency Medicine

## 2021-01-19 ENCOUNTER — Emergency Department (HOSPITAL_BASED_OUTPATIENT_CLINIC_OR_DEPARTMENT_OTHER): Payer: Medicare PPO | Admitting: Radiology

## 2021-01-19 ENCOUNTER — Other Ambulatory Visit: Payer: Self-pay

## 2021-01-19 DIAGNOSIS — M25532 Pain in left wrist: Secondary | ICD-10-CM | POA: Insufficient documentation

## 2021-01-19 DIAGNOSIS — Z87891 Personal history of nicotine dependence: Secondary | ICD-10-CM | POA: Insufficient documentation

## 2021-01-19 DIAGNOSIS — S40022A Contusion of left upper arm, initial encounter: Secondary | ICD-10-CM | POA: Insufficient documentation

## 2021-01-19 DIAGNOSIS — M25512 Pain in left shoulder: Secondary | ICD-10-CM | POA: Diagnosis not present

## 2021-01-19 DIAGNOSIS — M79642 Pain in left hand: Secondary | ICD-10-CM | POA: Diagnosis not present

## 2021-01-19 DIAGNOSIS — S4992XA Unspecified injury of left shoulder and upper arm, initial encounter: Secondary | ICD-10-CM | POA: Diagnosis present

## 2021-01-19 DIAGNOSIS — W109XXA Fall (on) (from) unspecified stairs and steps, initial encounter: Secondary | ICD-10-CM | POA: Diagnosis not present

## 2021-01-19 NOTE — ED Triage Notes (Signed)
Pt fell down 12-13 steps yesterday, no LOC, left shoulder pain, left ring finger, tenderness left wrist. Able to get self up after fall.

## 2021-01-19 NOTE — ED Provider Notes (Signed)
[facility] EMERGENCY DEPT Provider Note   CSN: [id number] Arrival date & time: 01/19/21  1011     History Chief Complaint  Patient presents with  . Shoulder Injury    Left    Michelle Martinez is a 70 y.o. female.  The history is provided by the patient.  Shoulder Injury This is a new problem. The current episode started yesterday. The problem occurs constantly. The problem has not changed since onset.Pertinent negatives include no chest pain, no abdominal pain, no headaches and no shortness of breath. Associated symptoms comments: Left shoulder, hand, wrist pain after fall yesterday down steps. No loc. Marland Kitchen Exacerbated by: movement. Relieved by: immobilization. She has tried nothing for the symptoms. The treatment provided no relief.       Past Medical History:  Diagnosis Date  . Anxiety   . Arthritis    hands & back - OA  . Complication of anesthesia    HYPOTENSION , DIFFICULTY WAKING UP   . Depression   . Dysrhythmia    OCC RAPID HEART BEAT  . Fatigue   . GERD (gastroesophageal reflux disease)   . Headache(784.0)    ocasonall sinus hedache  . Hypotension   . Melanoma ([place])   . Osteoporosis   . Palpitation   . PONV (postoperative nausea and vomiting)   . Syncope    SEES DR BRACKBILL     Patient Active Problem List   Diagnosis Date Noted  . Cervical myelopathy ([place]) 12/23/2018  . 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 Villous adenoma of right colon 03/20/2017  . Osteoporosis 11/20/2016  . Palpitation 04/10/2014  . Osteoarthritis of spine 03/02/2013  . Orthostatic hypotension 03/02/2013  . Hypercholesterolemia 03/25/2011    Past Surgical History:  Procedure Laterality Date  . ANTERIOR CERVICAL DECOMP/DISCECTOMY FUSION N/A 12/23/2018   Procedure: Cervical 3-4 Cervical 4-5 Anterior cervical decompression/discectomy/fusion;  Surgeon: Erline Levine, MD;  Location: [facility];  Service: Neurosurgery;  Laterality: N/A;  Cervical 3-4  Cervical 4-5 Anterior cervical decompression/discectomy/fusion  . BREAST LUMPECTOMY WITH RADIOACTIVE SEED LOCALIZATION Right 12/11/2016   Procedure: RIGHT BREAST LUMPECTOMY WITH RADIOACTIVE SEED LOCALIZATION;  Surgeon: Autumn Messing III, MD;  Location: [facility];  Service: General;  Laterality: Right;  . CERVICAL LAMINECTOMY    . [place]  . LAPAROSCOPIC RIGHT COLECTOMY Right 03/20/2017   Procedure: LAPAROSCOPIC ASSISTED RIGHT COLECTOMY;  Surgeon: Jovita Kussmaul, MD;  Location: [facility];  Service: General;  Laterality: Right;  . LUMBAR FUSION  12/15/2012   Dr Vertell Limber  . melanoma procedures     Dr. Ann Maki & Dr. Delman Cheadle  . TONSILLECTOMY    . TOTAL ABDOMINAL HYSTERECTOMY W/ BILATERAL SALPINGOOPHORECTOMY     Part of one ovary remians     OB History    Gravida  4   Para  3   Term  3   Preterm      AB  1   Living  2     SAB  1   IAB      Ectopic      Multiple      Live Births  3           Family History  Problem Relation Age of Onset  . Cancer Mother   . Other Mother        Glioblastoma  . Heart attack Father   . Cancer Father        prostate  . Dementia Father  late 36s    Social History   Tobacco Use  . Smoking status: Former Smoker    Quit date: 1978    Years since quitting: 44.2  . Smokeless tobacco: Never Used  Vaping Use  . Vaping Use: Never used  Substance Use Topics  . Alcohol use: Yes    Alcohol/week: 7.0 standard drinks    Types: 7 Glasses of wine per week    Comment: 1/ DAY  . Drug use: Never    Home Medications Prior to Admission medications   Medication Sig Start Date End Date Taking? Authorizing Provider  buPROPion (WELLBUTRIN XL) 150 MG 24 hr tablet TAKE 1 TABLET(150 MG) BY MOUTH DAILY 11/25/19   Megan Salon, MD  Cholecalciferol (VITAMIN D3 PO) Take 25 mg by mouth daily.    [provider]  Diclofenac-miSOPROStol 75-0.2 MG TBEC TK 1 T PO QD 04/28/19   [provider]  diphenhydrAMINE (BENADRYL) 25 MG  tablet Take 25 mg by mouth every 8 (eight) hours as needed for itching or allergies.     [provider]  DULoxetine (CYMBALTA) 60 MG capsule TAKE 1 CAPSULE(60 MG) BY MOUTH DAILY 11/25/19   Megan Salon, MD  estradiol (ESTRACE) 0.1 MG/GM vaginal cream 1 gram vaginally twice weekly 11/20/16   Megan Salon, MD  famotidine (PEPCID) 20 MG tablet Take 20 mg by mouth daily as needed for heartburn or indigestion.    [provider]  fluticasone (FLONASE) 50 MCG/ACT nasal spray Place 2 sprays into both nostrils every morning.    [provider]  hydrocortisone (ANUSOL-HC) 2.5 % rectal cream Place 1 application rectally 2 (two) times daily. 02/26/18   Megan Salon, MD  NONFORMULARY OR COMPOUNDED ITEM Estradiol 0.02% cream with Vit E.  Place 64ml vaginal and small amount externally three times weekly.  Disp:  3 month supply.  #4 RF. Patient not taking: Reported on 11/25/2019 07/15/19   Megan Salon, MD  OVER THE COUNTER MEDICATION 1 each daily. Turmeric/Curcumin x 285    [provider]  OVER THE COUNTER MEDICATION as needed. Tylenol Sinus    [provider]  Pseudoephedrine HCl (SUDAFED PO) Take by mouth as needed.    [provider]  psyllium (METAMUCIL) 58.6 % powder Take 1 packet by mouth daily.    [provider]  zoledronic acid (RECLAST) 5 MG/100ML SOLN injection Inject 5 mg into the vein. annually    [provider]    Allergies    Codeine, Crestor [rosuvastatin], Elemental sulfur, and Adhesive [tape]  Review of Systems   Review of Systems  Constitutional: Negative for fever.  Respiratory: Negative for shortness of breath.   Cardiovascular: Negative for chest pain.  Gastrointestinal: Negative for abdominal pain.  Musculoskeletal: Positive for arthralgias. Negative for back pain, gait problem and neck pain.  Skin: Negative for color change, pallor, rash and wound.  Neurological: Negative for weakness, numbness and  headaches.    Physical Exam Updated Vital Signs  ED Triage Vitals  Enc Vitals Group     BP --      Pulse --      Resp --      Temp --      Temp src --      SpO2 --      Weight 01/19/21 1019 119 lb (54 kg)     Height 01/19/21 1019 5' 2.5" (1.588 m)     Head Circumference --      Peak  Flow --      Pain Score 01/19/21 1018 2     Pain Loc --      Pain Edu? --      Excl. in [place]? --     Physical Exam Constitutional:      General: She is not in acute distress.    Appearance: She is not ill-appearing.  Cardiovascular:     Pulses: Normal pulses.  Musculoskeletal:        General: Tenderness (TTP left shoulder, pain with ROM) present. Normal range of motion.     Cervical back: Normal range of motion. No rigidity or tenderness.     Comments: TTP to left hand and wrist  Skin:    General: Skin is warm.     Capillary Refill: Capillary refill takes less than 2 seconds.  Neurological:     General: No focal deficit present.     Mental Status: She is alert.     Sensory: No sensory deficit.     Motor: No weakness.     ED Results / Procedures / Treatments   Labs (all labs ordered are listed, but only abnormal results are displayed) Labs Reviewed - No data to display  EKG None  Radiology DG Wrist Complete Left  Result Date: 01/19/2021 CLINICAL DATA:  Left shoulder, hand, and wrist pain status post fall down stairs yesterday. EXAM: LEFT SHOULDER - 2+ VIEW; LEFT HAND - COMPLETE 3+ VIEW; LEFT WRIST - COMPLETE 3+ VIEW COMPARISON:  None. FINDINGS: Left shoulder: Mild degenerative changes seen at the acromioclavicular joint. Minimal spurring of the glenohumeral joint no fracture or dislocation. Left wrist: Mild degenerative changes at the first carpometacarpal joint. No acute fracture or dislocation. No significant abnormality of the soft tissues. Left hand: Evaluation of the distal phalanx of the third digit is limited due to overlying artifact and it being excluded from the frontal view.  Mild-to-moderate degenerative changes seen at the distal and proximal interphalangeal joints of the index finger. IMPRESSION: No acute fracture or dislocation of the left hand, wrist, or shoulder. Electronically Signed   By: Miachel Roux M.D.   On: 01/19/2021 11:16   DG Shoulder Left  Result Date: 01/19/2021 CLINICAL DATA:  Left shoulder, hand, and wrist pain status post fall down stairs yesterday. EXAM: LEFT SHOULDER - 2+ VIEW; LEFT HAND - COMPLETE 3+ VIEW; LEFT WRIST - COMPLETE 3+ VIEW COMPARISON:  None. FINDINGS: Left shoulder: Mild degenerative changes seen at the acromioclavicular joint. Minimal spurring of the glenohumeral joint no fracture or dislocation. Left wrist: Mild degenerative changes at the first carpometacarpal joint. No acute fracture or dislocation. No significant abnormality of the soft tissues. Left hand: Evaluation of the distal phalanx of the third digit is limited due to overlying artifact and it being excluded from the frontal view. Mild-to-moderate degenerative changes seen at the distal and proximal interphalangeal joints of the index finger. IMPRESSION: No acute fracture or dislocation of the left hand, wrist, or shoulder. Electronically Signed   By: Miachel Roux M.D.   On: 01/19/2021 11:16   DG Hand Complete Left  Result Date: 01/19/2021 CLINICAL DATA:  Left shoulder, hand, and wrist pain status post fall down stairs yesterday. EXAM: LEFT SHOULDER - 2+ VIEW; LEFT HAND - COMPLETE 3+ VIEW; LEFT WRIST - COMPLETE 3+ VIEW COMPARISON:  None. FINDINGS: Left shoulder: Mild degenerative changes seen at the acromioclavicular joint. Minimal spurring of the glenohumeral joint no fracture or dislocation. Left wrist: Mild degenerative changes at the first carpometacarpal joint. No  acute fracture or dislocation. No significant abnormality of the soft tissues. Left hand: Evaluation of the distal phalanx of the third digit is limited due to overlying artifact and it being excluded from the frontal  view. Mild-to-moderate degenerative changes seen at the distal and proximal interphalangeal joints of the index finger. IMPRESSION: No acute fracture or dislocation of the left hand, wrist, or shoulder. Electronically Signed   By: Miachel Roux M.D.   On: 01/19/2021 11:16    Procedures Procedures   Medications Ordered in ED Medications - No data to display  ED Course  I have reviewed the triage vital signs and the nursing notes.  Pertinent labs & imaging results that were available during my care of the patient were reviewed by me and considered in my medical decision making (see chart for details).    MDM Rules/Calculators/A&P                          SHAYLIE EKLUND is here with left hand, wrist, shoulder pain after fall yesterday.  No loss of consciousness.  Normal vitals.  Patient landed on the left side of her arm going down a flight of stairs.  Did not get the arm twisted.  Has tenderness in the left hand, wrist, shoulder.  Normal range of motion.  No obvious deformities.  Overall suspect contusion.  Will get x-rays to rule out fractures.  Neurovascularly neuromuscularly intact.  No headache, no neck pain.  No midline spinal pain.  11:21 AM pt re-evaluatin X-rays negative for fracture or dislocation.  Suspect contusion.  Discharged in good condition.  This chart was dictated using voice recognition software.  Despite best efforts to proofread,  errors can occur which can change the documentation meaning.    Final Clinical Impression(s) / ED Diagnoses Final diagnoses:  Arm contusion, left, initial encounter    Rx / DC Orders ED Discharge Orders    None       Lennice Sites, DO 01/19/21 1121

## 2021-01-19 NOTE — ED Notes (Signed)
Pt discharged home after verbalizing understanding of discharge instructions; nad noted. 

## 2021-01-19 NOTE — Discharge Instructions (Addendum)
Overall suspect you have contusion of your left arm.  Recommend Tylenol Motrin and ice.  There is no fracture or dislocation on your x-rays today.

## 2021-01-22 DIAGNOSIS — S52591D Other fractures of lower end of right radius, subsequent encounter for closed fracture with routine healing: Secondary | ICD-10-CM | POA: Diagnosis not present

## 2021-01-30 ENCOUNTER — Encounter: Payer: Self-pay | Admitting: Neurology

## 2021-02-22 ENCOUNTER — Ambulatory Visit: Payer: Medicare PPO

## 2021-03-14 DIAGNOSIS — R413 Other amnesia: Secondary | ICD-10-CM | POA: Diagnosis not present

## 2021-03-21 ENCOUNTER — Ambulatory Visit: Payer: Medicare PPO | Admitting: Obstetrics and Gynecology

## 2021-03-21 NOTE — Progress Notes (Deleted)
70 y.o. [id number] Widowed Caucasian female here for annual exam.    PCP:     No LMP recorded. Patient has had a hysterectomy.           Sexually active: {yes no:314532}  The current method of family planning is status post hysterectomy.    Exercising: {yes no:314532}  {types:19826} Smoker:  no  Health Maintenance: Pap:  Years ago normal History of abnormal Pap:  no MMG: ***10/2016--SEE EPIC Colonoscopy: 03/20/17 Right Colectomy, colonoscopy 03/24/2018 with Dr. Collene Mares, follow-up 5 years BMD: 10-28-16  Result :Osteoporosis TDaP:  2014 Gardasil:   no HIV: *** Hep C: 11-20-16 Neg Screening Labs:  Hb today: ***, Urine today: ***   reports that she quit smoking about 44 years ago. She has never used smokeless tobacco. She reports current alcohol use of about 7.0 standard drinks of alcohol per week. She reports that she does not use drugs.  Past Medical History:  Diagnosis Date  . Anxiety   . Arthritis    hands & back - OA  . Complication of anesthesia    HYPOTENSION , DIFFICULTY WAKING UP   . Depression   . Dysrhythmia    OCC RAPID HEART BEAT  . Fatigue   . GERD (gastroesophageal reflux disease)   . Headache(784.0)    ocasonall sinus hedache  . Hypotension   . Melanoma ([place])   . Osteoporosis   . Palpitation   . PONV (postoperative nausea and vomiting)   . Syncope    SEES DR Mare Ferrari     Past Surgical History:  Procedure Laterality Date  . ANTERIOR CERVICAL DECOMP/DISCECTOMY FUSION N/A 12/23/2018   Procedure: Cervical 3-4 Cervical 4-5 Anterior cervical decompression/discectomy/fusion;  Surgeon: Erline Levine, MD;  Location: [place];  Service: Neurosurgery;  Laterality: N/A;  Cervical 3-4 Cervical 4-5 Anterior cervical decompression/discectomy/fusion  . BREAST LUMPECTOMY WITH RADIOACTIVE SEED LOCALIZATION Right 12/11/2016   Procedure: RIGHT BREAST LUMPECTOMY WITH RADIOACTIVE SEED LOCALIZATION;  Surgeon: Autumn Messing III, MD;  Location: [place];  Service: General;  Laterality: Right;  .  CERVICAL LAMINECTOMY    . [place]  . LAPAROSCOPIC RIGHT COLECTOMY Right 03/20/2017   Procedure: LAPAROSCOPIC ASSISTED RIGHT COLECTOMY;  Surgeon: Jovita Kussmaul, MD;  Location: [place];  Service: General;  Laterality: Right;  . LUMBAR FUSION  12/15/2012   Dr Vertell Limber  . melanoma procedures     Dr. Ann Maki & Dr. Delman Cheadle  . TONSILLECTOMY    . TOTAL ABDOMINAL HYSTERECTOMY W/ BILATERAL SALPINGOOPHORECTOMY     Part of one ovary remians    Current Outpatient Medications  Medication Sig Dispense Refill  . buPROPion (WELLBUTRIN XL) 150 MG 24 hr tablet TAKE 1 TABLET(150 MG) BY MOUTH DAILY 90 tablet 4  . Cholecalciferol (VITAMIN D3 PO) Take 25 mg by mouth daily.    . Diclofenac-miSOPROStol 75-0.2 MG TBEC TK 1 T PO QD    . diphenhydrAMINE (BENADRYL) 25 MG tablet Take 25 mg by mouth every 8 (eight) hours as needed for itching or allergies.     . DULoxetine (CYMBALTA) 60 MG capsule TAKE 1 CAPSULE(60 MG) BY MOUTH DAILY 90 capsule 4  . estradiol (ESTRACE) 0.1 MG/GM vaginal cream 1 gram vaginally twice weekly 42.5 g 3  . famotidine (PEPCID) 20 MG tablet Take 20 mg by mouth daily as needed for heartburn or indigestion.    . fluticasone (FLONASE) 50 MCG/ACT nasal spray Place 2 sprays into both nostrils every morning.    . hydrocortisone (ANUSOL-HC) 2.5 % rectal  cream Place 1 application rectally 2 (two) times daily. 30 g 1  . NONFORMULARY OR COMPOUNDED ITEM Estradiol 0.02% cream with Vit E.  Place 41ml vaginal and small amount externally three times weekly.  Disp:  3 month supply.  #4 RF. (Patient not taking: Reported on 11/25/2019) 36 each 4  . OVER THE COUNTER MEDICATION 1 each daily. Turmeric/Curcumin x 285    . OVER THE COUNTER MEDICATION as needed. Tylenol Sinus    . Pseudoephedrine HCl (SUDAFED PO) Take by mouth as needed.    . psyllium (METAMUCIL) 58.6 % powder Take 1 packet by mouth daily.    . zoledronic acid (RECLAST) 5 MG/100ML SOLN injection Inject 5 mg into the vein. annually     No  current facility-administered medications for this visit.    Family History  Problem Relation Age of Onset  . Cancer Mother   . Other Mother        Glioblastoma  . Heart attack Father   . Cancer Father        prostate  . Dementia Father        late 58s    Review of Systems  Exam:   There were no vitals taken for this visit.    General appearance: alert, cooperative and appears stated age Head: normocephalic, without obvious abnormality, atraumatic Neck: no adenopathy, supple, symmetrical, trachea midline and thyroid normal to inspection and palpation Lungs: clear to auscultation bilaterally Breasts: normal appearance, no masses or tenderness, No nipple retraction or dimpling, No nipple discharge or bleeding, No axillary adenopathy Heart: regular rate and rhythm Abdomen: soft, non-tender; no masses, no organomegaly Extremities: extremities normal, atraumatic, no cyanosis or edema Skin: skin color, texture, turgor normal. No rashes or lesions Lymph nodes: cervical, supraclavicular, and axillary nodes normal. Neurologic: grossly normal  Pelvic: External genitalia:  no lesions              No abnormal inguinal nodes palpated.              Urethra:  normal appearing urethra with no masses, tenderness or lesions              Bartholins and Skenes: normal                 Vagina: normal appearing vagina with normal color and discharge, no lesions              Cervix: no lesions              Pap taken: {yes no:314532} Bimanual Exam:  Uterus:  normal size, contour, position, consistency, mobility, non-tender              Adnexa: no mass, fullness, tenderness              Rectal exam: {yes no:314532}.  Confirms.              Anus:  normal sphincter tone, no lesions  Chaperone was present for exam.  Assessment:   Well woman visit with normal exam.   Plan: Mammogram screening discussed. Self breast awareness reviewed. Pap and HR HPV as above. Guidelines for Calcium, Vitamin D,  regular exercise program including cardiovascular and weight bearing exercise.   Follow up annually and prn.   Additional counseling given.  {yes [id number]. _______ minutes face to face time of which over 50% was spent in counseling.    After visit summary provided.

## 2021-03-26 DIAGNOSIS — D2261 Melanocytic nevi of right upper limb, including shoulder: Secondary | ICD-10-CM | POA: Diagnosis not present

## 2021-03-26 DIAGNOSIS — D2230 Melanocytic nevi of unspecified part of face: Secondary | ICD-10-CM | POA: Diagnosis not present

## 2021-03-26 DIAGNOSIS — Z86018 Personal history of other benign neoplasm: Secondary | ICD-10-CM | POA: Diagnosis not present

## 2021-03-26 DIAGNOSIS — D2272 Melanocytic nevi of left lower limb, including hip: Secondary | ICD-10-CM | POA: Diagnosis not present

## 2021-03-26 DIAGNOSIS — D225 Melanocytic nevi of trunk: Secondary | ICD-10-CM | POA: Diagnosis not present

## 2021-03-26 DIAGNOSIS — D2271 Melanocytic nevi of right lower limb, including hip: Secondary | ICD-10-CM | POA: Diagnosis not present

## 2021-03-26 DIAGNOSIS — Z85828 Personal history of other malignant neoplasm of skin: Secondary | ICD-10-CM | POA: Diagnosis not present

## 2021-03-26 DIAGNOSIS — Z86008 Personal history of in-situ neoplasm of other site: Secondary | ICD-10-CM | POA: Diagnosis not present

## 2021-03-26 DIAGNOSIS — L578 Other skin changes due to chronic exposure to nonionizing radiation: Secondary | ICD-10-CM | POA: Diagnosis not present

## 2021-04-01 ENCOUNTER — Other Ambulatory Visit: Payer: Self-pay | Admitting: Obstetrics & Gynecology

## 2021-05-10 ENCOUNTER — Ambulatory Visit: Payer: Medicare PPO | Admitting: Neurology

## 2021-05-12 NOTE — Progress Notes (Addendum)
Assessment/Plan:   Michelle Martinez is a 70 y.o. year old female with risk factors including  hypertension, cervical myelopathy, hyperlipidemia, CAD, anxiety, depression and possible dementia. She was on rivastigmine 4.6 mg TD/24 at some point, although she has run out of the medication and is unsure how long she has been without it.  MoCA today is 18/30 with deficiencies in visuospatial/executive, memory, language, abstraction, delayed recall 0 /5, orientation  4/6  MMSE on 07/20/19 was 24/30 (=MoCA18/30)    Recommendations:   Memory Loss   MRI brain with/without contrast to assess for underlying structural abnormality and assess vascular load  Neurocognitive testing to further evaluate cognitive concerns and determine underlying cause of memory changes, including potential contribution from sleep, anxiety, or depression  Check B12, TSH Will check on the exact medication patient was taking for memory and to determine the length of time she was without. Will consider restarting med Discussed safety both in and out of the home.  Discussed the importance of regular daily schedule with inclusion of crossword puzzles to maintain brain function.  Continue to monitor mood with PCP. List of CBT was given to her.  Stay active at least 30 minutes at least 3 times a week.  Naps should be scheduled and should be no longer than 60 minutes and should not occur after 2 PM.  Mediterranean diet is recommended  Folllow up once results above are available   Subjective:    The patient is seen in neurologic consultation at the request of Josetta Huddle, MD for the evaluation of memory. The patient is here alone.  sheis a 70 y.o. year old female who has had memory issues for about 5 or 6 years, at which time she was taking care of her dying husband to ALS.  She was seen by Dr. Jaynee Eagles from [facility] neurological, and placed initially on Aricept.  In the interim, her husband died, she had multiple  procedures, including discectomy, lumpectomy and colostomy, all these procedures without malignancy.  Her PCP noticed some changes in her memory, and felt that she needed to have a new neurological evaluation.  The patient may have been on rivastigmine patch at some point, although she states that she ran out of it.  She states that she was taking the patch because she was forgetting to take her doses.  She lives alone, with 2 sons nearby, and good neighbors.  She is a retired Mudlogger at [employer].  Overall, she tries to stay active, helping one of her sons in the restaurant, but she has been feeling more depressed, especially when she is trying to remember and the memory "failed ".  "It does not help with my friends asked me what did you say? or what you were trying to say? ".  She denies irritability.  She sleeps well, denying vivid dreams or sleepwalking.  Denies hallucinations or paranoia.  She does not need assistance with bathing and dressing.  As mentioned above, she forgets at times to take her medications, because she does not like them.  At times, she forgets to pay bills.  She had one incident recently, which made her aware of her memory loss.  There is an application called [employer], which helps her to play memory games.  She kept playing, but she forgot to pay the [place] bill, almost leading her to collection.  She now lives herself a note.    Denies living objects in unusual places. Appetite is good, "  not very healthy ".  Denies trouble swallowing.  She does not like to cook much.  Denies leaving the stove or the faucet on.  Ambulates without difficulty without the use of a cane or a walker.  She drives without getting lost and uses a GPS.  She has occasional headaches, which she attributes to caffeine, which are resolved with drinking a cup of coffee (she drinks 2 coffees a day and of parenthesis.  She denies any falls or injuries to the head, double vision, dizziness, focal  numbness or tingling, unilateral weakness or tremors.  She denies urine incontinence or retention, constipation or diarrhea.  Family history remarkable for Lewy body dementia in her father.  She denies a history of OSA, alcohol, or tobacco.  She has 2 children, has a college degree.    MRI brain 08/09/19 IMPRESSION: This MRI of the brain with and without contrast shows the following: 1.   Mild chronic microvascular ischemic changes in the pons and hemispheres. 2.   Minimal generalized cortical atrophy, likely normal for age. 3.   There is a hyperintense focus on coronal diffusion-weighted images with a corresponding hypointensity on ADC mapping adjacent to the central sulcus on the left that would be worrisome for a small subacute infarction.  However, as the same region on axial diffusion-weighted images and T2/flair images is normal, this likely represents artifact. 4.   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    Allergies  Allergen Reactions   Codeine Nausea Only and Other (See Comments)    HALLUCINATIONS   Crestor [Rosuvastatin] Other (See Comments)    MYALGIAS, CRAMPING    Elemental Sulfur Swelling    LIPS, HANDS, FEET   Adhesive [Tape] Other (See Comments)    blisters    Current Outpatient Medications  Medication Instructions   buPROPion (WELLBUTRIN XL) 150 MG 24 hr tablet TAKE 1 TABLET(150 MG) BY MOUTH DAILY   Cholecalciferol (VITAMIN D3 PO) 25 mg, Oral, Daily   Diclofenac-miSOPROStol 75-0.2 MG TBEC TK 1 T PO QD   diphenhydrAMINE (BENADRYL) 25 mg, Oral, Every 8 hours PRN   DULoxetine (CYMBALTA) 60 MG capsule TAKE 1 CAPSULE(60 MG) BY MOUTH DAILY   famotidine (PEPCID) 20 mg, Oral, Daily PRN   fluticasone (FLONASE) 50 MCG/ACT nasal spray 2 sprays, Each Nare, BH-each morning   hydrocortisone (ANUSOL-HC) 2.5 % rectal cream 1 application, Rectal, 2 times daily   OVER THE COUNTER MEDICATION As needed, Tylenol Sinus    psyllium (METAMUCIL) 58.6 % powder 1  packet, Oral, Daily   zoledronic acid (RECLAST) 5 mg, Intravenous, annually     VITALS:   Vitals:   05/13/21 1346  BP: 123/71  Pulse: 92  SpO2: 95%  Weight: 121 lb 9.6 oz (55.2 kg)  Height: '5\' 3"'$  (1.6 m)   No flowsheet data found.  PHYSICAL EXAM   HEENT:  Normocephalic, atraumatic. The mucous membranes are moist. The superficial temporal arteries are without ropiness or tenderness. Cardiovascular: Regular rate and rhythm. Lungs: Clear to auscultation bilaterally. Neck: There are no carotid bruits noted bilaterally.  NEUROLOGICAL: Montreal Cognitive Assessment  05/13/2021  Visuospatial/ Executive (0/5) 2  Naming (0/3) 3  Attention: Read list of digits (0/2) 2  Attention: Read list of letters (0/1) 1  Attention: Serial 7 subtraction starting at 100 (0/3) 2  Language: Repeat phrase (0/2) 1  Language : Fluency (0/1) 1  Abstraction (0/2) 2  Delayed Recall (0/5) 0  Orientation (0/6) 4  Total 18  Adjusted Score (based on  education) 18   MMSE - Mini Mental State Exam 07/20/2019  Orientation to time 5  Orientation to Place 4  Registration 3  Attention/ Calculation 2  Recall 1  Language- name 2 objects 2  Language- repeat 1  Language- follow 3 step command 3  Language- read & follow direction 1  Write a sentence 1  Copy design 1  Total score 24    No flowsheet data found.   Orientation:  Alert and oriented to person, place and not to date. No aphasia or dysarthria. Fund of knowledge is appropriate. Recent memory impaired and remote memory intact.  Attention and concentration are normal.  Able to name objects and repeat phrases 1/2 . Delayed recall  0/5 Orientation 4/6  Cranial nerves: There is good facial symmetry. Extraocular muscles are intact and visual fields are full to confrontational testing. Speech is fluent and clear. Soft palate rises symmetrically and there is no tongue deviation. Hearing is intact to conversational tone. Tone: Tone is good  throughout. Sensation: Sensation is intact to light touch and pinprick throughout. Vibration is intact at the bilateral big toe.There is no extinction with double simultaneous stimulation. There is no sensory dermatomal level identified. Coordination: The patient has no difficulty with RAM's or FNF bilaterally. Normal finger to nose  Motor: Strength is 5/5 in the bilateral upper and lower extremities. There is no pronator drift. There are no fasciculations noted. DTR's: Deep tendon reflexes are 2/4 at the bilateral biceps, triceps, brachioradialis, patella and achilles.  Plantar responses are downgoing bilaterally. Gait and Station: The patient is able to ambulate without difficulty.The patient is able to heel toe walk without any difficulty.The patient is able to ambulate in a tandem fashion. The patient is able to stand in the Romberg position.     Thank you for allowing [place] the opportunity to participate in the care of this nice patient. Please do not hesitate to contact us for any questions or concerns.   Total time spent on today's visit was  60 minutes, including both face-to-face time and nonface-to-face time.  Time included that spent on review of records (prior notes available to me/labs/imaging if pertinent), discussing treatment and goals, answering patient's questions and coordinating care.  Cc:  Josetta Huddle, MD  Sharene Butters 05/13/2021 2:41 PM

## 2021-05-13 ENCOUNTER — Other Ambulatory Visit (INDEPENDENT_AMBULATORY_CARE_PROVIDER_SITE_OTHER): Payer: Medicare PPO

## 2021-05-13 ENCOUNTER — Encounter: Payer: Self-pay | Admitting: Physician Assistant

## 2021-05-13 ENCOUNTER — Other Ambulatory Visit: Payer: Self-pay

## 2021-05-13 ENCOUNTER — Ambulatory Visit: Payer: Medicare PPO | Admitting: Physician Assistant

## 2021-05-13 VITALS — BP 123/71 | HR 92 | Ht 63.0 in | Wt 121.6 lb

## 2021-05-13 DIAGNOSIS — R413 Other amnesia: Secondary | ICD-10-CM

## 2021-05-13 LAB — VITAMIN B12: Vitamin B-12: 320 pg/mL (ref 211–911)

## 2021-05-13 LAB — TSH: TSH: 0.74 u[IU]/mL (ref 0.35–5.50)

## 2021-05-13 NOTE — Patient Instructions (Signed)
It was a pleasure to see you today at our office.   Recommendations:  Neurocognitive evaluation at our office MRI of the brain, the office will call you to arrange you appointment Check B12 and TSH at the lab Certified therapist list given, please consider to help you with emotions  Follow up once the results of the above are available   RECOMMENDATIONS FOR ALL PATIENTS WITH MEMORY PROBLEMS: 1. Continue to exercise (Recommend 30 minutes of walking everyday, or 3 hours every week) 2. Increase social interactions - continue going to [place] and enjoy social gatherings with friends and family 3. Eat healthy, avoid fried foods and eat more fruits and vegetables 4. Maintain adequate blood pressure, blood sugar, and blood cholesterol level. Reducing the risk of stroke and cardiovascular disease also helps promoting better memory. 5. Avoid stressful situations. Live a simple life and avoid aggravations. Organize your time and prepare for the next day in anticipation. 6. Sleep well, avoid any interruptions of sleep and avoid any distractions in the bedroom that may interfere with adequate sleep quality 7. Avoid sugar, avoid sweets as there is a strong link between excessive sugar intake, diabetes, and cognitive impairment We discussed the Mediterranean diet, which has been shown to help patients reduce the risk of progressive memory disorders and reduces cardiovascular risk. This includes eating fish, eat fruits and green leafy vegetables, nuts like almonds and hazelnuts, walnuts, and also use olive oil. Avoid fast foods and fried foods as much as possible. Avoid sweets and sugar as sugar use has been linked to worsening of memory function.  There is always a concern of gradual progression of memory problems. If this is the case, then we may need to adjust level of care according to patient needs. Support, both to the patient and caregiver, should then be put into place.      You have been referred  for a neuropsychological evaluation (i.e., evaluation of memory and thinking abilities). Please bring someone with you to this appointment if possible, as it is helpful for the doctor to hear from both you and another adult who knows you well. Please bring eyeglasses and hearing aids if you wear them.    The evaluation will take approximately 3 hours and has two parts:   The first part is a clinical interview with the neuropsychologist (Dr. Melvyn Novas or Dr. Nicole Kindred). During the interview, the neuropsychologist will speak with you and the individual you brought to the appointment.    The second part of the evaluation is testing with the doctor's technician Hinton Dyer or Maudie Mercury). During the testing, the technician will ask you to remember different types of material, solve problems, and answer some questionnaires. Your family member will not be present for this portion of the evaluation.   Please note: We must reserve several hours of the neuropsychologist's time and the psychometrician's time for your evaluation appointment. As such, there is a No-Show fee of $100. If you are unable to attend any of your appointments, please contact our office as soon as possible to reschedule.    FALL PRECAUTIONS: Be cautious when walking. Scan the area for obstacles that may increase the risk of trips and falls. When getting up in the mornings, sit up at the edge of the bed for a few minutes before getting out of bed. Consider elevating the bed at the head end to avoid drop of blood pressure when getting up. Walk always in a well-lit room (use night lights in the walls). Avoid area  rugs or power cords from appliances in the middle of the walkways. Use a walker or a cane if necessary and consider physical therapy for balance exercise. Get your eyesight checked regularly.  FINANCIAL OVERSIGHT: Supervision, especially oversight when making financial decisions or transactions is also recommended.  HOME SAFETY: Consider the safety of  the kitchen when operating appliances like stoves, microwave oven, and blender. Consider having supervision and share cooking responsibilities until no longer able to participate in those. Accidents with firearms and other hazards in the house should be identified and addressed as well.   ABILITY TO BE LEFT ALONE: If patient is unable to contact 911 operator, consider using LifeLine, or when the need is there, arrange for someone to stay with patients. Smoking is a fire hazard, consider supervision or cessation. Risk of wandering should be assessed by caregiver and if detected at any point, supervision and safe proof recommendations should be instituted.  MEDICATION SUPERVISION: Inability to self-administer medication needs to be constantly addressed. Implement a mechanism to ensure safe administration of the medications.   DRIVING: Regarding driving, in patients with progressive memory problems, driving will be impaired. We advise to have someone else do the driving if trouble finding directions or if minor accidents are reported. Independent driving assessment is available to determine safety of driving.   If you are interested in the driving assessment, you can contact the following:  [employer] in [place]  [place] [phone] or [phone]    [place] refers to food and lifestyle choices that are based on the traditions of countries located on the [employer]. This way of eating has been shown to help prevent certain conditions and improve outcomes for people who have chronic diseases, like kidney disease and heart disease. What are tips for following this plan? Lifestyle  Cook and eat meals together with your family, when possible. Drink enough fluid to keep your urine clear or pale yellow. Be physically active every day.  This includes: Aerobic exercise like running or swimming. Leisure activities like gardening, walking, or housework. Get 7-8 hours of sleep each night. If recommended by your health care provider, drink red wine in moderation. This means 1 glass a day for nonpregnant women and 2 glasses a day for men. A glass of wine equals 5 oz (150 mL). Reading food labels  Check the serving size of packaged foods. For foods such as rice and pasta, the serving size refers to the amount of cooked product, not dry. Check the total fat in packaged foods. Avoid foods that have saturated fat or trans fats. Check the ingredients list for added sugars, such as corn syrup. Shopping  At the grocery store, buy most of your food from the areas near the walls of the store. This includes: Fresh fruits and vegetables (produce). Grains, beans, nuts, and seeds. Some of these may be available in unpackaged forms or large amounts (in bulk). Fresh seafood. Poultry and eggs. Low-fat dairy products. Buy whole ingredients instead of prepackaged foods. Buy fresh fruits and vegetables in-season from local farmers markets. Buy frozen fruits and vegetables in resealable bags. If you do not have access to quality fresh seafood, buy precooked frozen shrimp or canned fish, such as tuna, salmon, or sardines. Buy small amounts of raw or cooked vegetables, salads, or olives from the deli or salad bar at your store. Stock your pantry so you always have certain  foods on hand, such as olive oil, canned tuna, canned tomatoes, rice, pasta, and beans. Cooking  Cook foods with extra-virgin olive oil instead of using butter or other vegetable oils. Have meat as a side dish, and have vegetables or grains as your main dish. This means having meat in small portions or adding small amounts of meat to foods like pasta or stew. Use beans or vegetables instead of meat in common dishes like chili or lasagna. Experiment with different cooking methods.  Try roasting or broiling vegetables instead of steaming or sauteing them. Add frozen vegetables to soups, stews, pasta, or rice. Add nuts or seeds for added healthy fat at each meal. You can add these to yogurt, salads, or vegetable dishes. Marinate fish or vegetables using olive oil, lemon juice, garlic, and fresh herbs. Meal planning  Plan to eat 1 vegetarian meal one day each week. Try to work up to 2 vegetarian meals, if possible. Eat seafood 2 or more times a week. Have healthy snacks readily available, such as: Vegetable sticks with hummus. Greek yogurt. Fruit and nut trail mix. Eat balanced meals throughout the week. This includes: Fruit: 2-3 servings a day Vegetables: 4-5 servings a day Low-fat dairy: 2 servings a day Fish, poultry, or lean meat: 1 serving a day Beans and legumes: 2 or more servings a week Nuts and seeds: 1-2 servings a day Whole grains: 6-8 servings a day Extra-virgin olive oil: 3-4 servings a day Limit red meat and sweets to only a few servings a month What are my food choices? Mediterranean diet Recommended Grains: Whole-grain pasta. Brown rice. Bulgar wheat. Polenta. Couscous. Whole-wheat bread. [place]. Vegetables: Artichokes. Beets. Broccoli. Cabbage. Carrots. Eggplant. Green beans. Chard. Kale. Spinach. Onions. Leeks. Peas. Squash. Tomatoes. Peppers. Radishes. Fruits: Apples. Apricots. Avocado. Berries. Bananas. Cherries. Dates. Figs. Grapes. Lemons. Melon. Oranges. Peaches. Plums. Pomegranate. Meats and other protein foods: Beans. Almonds. Sunflower seeds. Pine nuts. Peanuts. [place]. Salmon. Scallops. Shrimp. [place]. Tilapia. Clams. Oysters. Eggs. Dairy: Low-fat milk. Cheese. Greek yogurt. Beverages: Water. Red wine. Herbal tea. Fats and oils: Extra virgin olive oil. Avocado oil. Grape seed oil. Sweets and desserts: [place] yogurt with honey. Baked apples. Poached pears. Trail mix. Seasoning and other foods: Basil. Cilantro. Coriander. Cumin. Mint.  Parsley. Sage. Rosemary. Tarragon. Garlic. Oregano. Thyme. Pepper. Balsalmic vinegar. Tahini. Hummus. Tomato sauce. Olives. Mushrooms. Limit these Grains: Prepackaged pasta or rice dishes. Prepackaged cereal with added sugar. Vegetables: Deep fried potatoes (french fries). Fruits: Fruit canned in syrup. Meats and other protein foods: Beef. Pork. Lamb. Poultry with skin. Hot dogs. Berniece Salines. Dairy: Ice cream. Sour cream. Whole milk. Beverages: Juice. Sugar-sweetened soft drinks. Beer. Liquor and spirits. Fats and oils: Butter. Canola oil. Vegetable oil. Beef fat (tallow). Lard. Sweets and desserts: Cookies. Cakes. Pies. Candy. Seasoning and other foods: Mayonnaise. Premade sauces and marinades. The items listed may not be a complete list. Talk with your dietitian about what dietary choices are right for you. Summary The Mediterranean diet includes both food and lifestyle choices. Eat a variety of fresh fruits and vegetables, beans, nuts, seeds, and whole grains. Limit the amount of red meat and sweets that you eat. Talk with your health care provider about whether it is safe for you to drink red wine in moderation. This means 1 glass a day for nonpregnant women and 2 glasses a day for men. A glass of wine equals 5 oz (150 mL). This information is not intended to replace advice given to you by your health care provider.  Make sure you discuss any questions you have with your health care provider. Document Released: 05/29/2016 Document Revised: 07/01/2016 Document Reviewed: 05/29/2016 Elsevier Interactive Patient Education  2017 [employer].

## 2021-05-16 ENCOUNTER — Telehealth: Payer: Self-pay

## 2021-05-16 NOTE — Telephone Encounter (Signed)
Pt called per DPR left a voice mail lab work is normal

## 2021-05-16 NOTE — Telephone Encounter (Signed)
-----   Message from Rondel Jumbo, PA-C sent at 05/14/2021  8:06 AM EDT ----- Please inform patient Michelle Martinez and thyroid lab tests are normal, thanks

## 2021-06-11 ENCOUNTER — Other Ambulatory Visit: Payer: Self-pay

## 2021-06-11 ENCOUNTER — Ambulatory Visit
Admission: RE | Admit: 2021-06-11 | Discharge: 2021-06-11 | Disposition: A | Discharge status: Home / Self Care | Payer: Medicare PPO | Source: Ambulatory Visit | Attending: Physician Assistant | Admitting: Physician Assistant

## 2021-06-11 DIAGNOSIS — F418 Other specified anxiety disorders: Secondary | ICD-10-CM | POA: Diagnosis not present

## 2021-06-11 DIAGNOSIS — R413 Other amnesia: Secondary | ICD-10-CM | POA: Diagnosis not present

## 2021-06-11 DIAGNOSIS — F3342 Major depressive disorder, recurrent, in full remission: Secondary | ICD-10-CM | POA: Diagnosis not present

## 2021-06-11 DIAGNOSIS — E559 Vitamin D deficiency, unspecified: Secondary | ICD-10-CM | POA: Diagnosis not present

## 2021-06-11 DIAGNOSIS — G319 Degenerative disease of nervous system, unspecified: Secondary | ICD-10-CM | POA: Diagnosis not present

## 2021-06-11 DIAGNOSIS — E7800 Pure hypercholesterolemia, unspecified: Secondary | ICD-10-CM | POA: Diagnosis not present

## 2021-06-11 DIAGNOSIS — I6782 Cerebral ischemia: Secondary | ICD-10-CM | POA: Diagnosis not present

## 2021-06-11 DIAGNOSIS — H81312 Aural vertigo, left ear: Secondary | ICD-10-CM | POA: Diagnosis not present

## 2021-06-11 DIAGNOSIS — Q283 Other malformations of cerebral vessels: Secondary | ICD-10-CM | POA: Diagnosis not present

## 2021-06-12 ENCOUNTER — Other Ambulatory Visit: Payer: Self-pay | Admitting: Internal Medicine

## 2021-06-12 DIAGNOSIS — Z1231 Encounter for screening mammogram for malignant neoplasm of breast: Secondary | ICD-10-CM

## 2021-06-14 NOTE — Progress Notes (Signed)
Pt advised of MRI results.

## 2021-07-04 ENCOUNTER — Telehealth: Payer: Self-pay | Admitting: Physician Assistant

## 2021-07-04 NOTE — Telephone Encounter (Signed)
Patient called and rescheduled her neuropsychological testing from November 2022 to March 2023 due to provider availability.  She'd been added to the wait list.  Patient wants to know if there is anything she can do in the meantime to help with her memory. What does Michelle Martinez, [place] recommend?  [facility] at [address]

## 2021-07-05 NOTE — Telephone Encounter (Signed)
Sent message to patient as instructed by Sharene Butters PA-C

## 2021-07-08 DIAGNOSIS — M5386 Other specified dorsopathies, lumbar region: Secondary | ICD-10-CM | POA: Diagnosis not present

## 2021-07-08 DIAGNOSIS — F322 Major depressive disorder, single episode, severe without psychotic features: Secondary | ICD-10-CM | POA: Diagnosis not present

## 2021-07-24 ENCOUNTER — Ambulatory Visit
Admission: RE | Admit: 2021-07-24 | Discharge: 2021-07-24 | Disposition: A | Discharge status: Home / Self Care | Payer: Medicare PPO | Source: Ambulatory Visit | Attending: Internal Medicine | Admitting: Internal Medicine

## 2021-07-24 ENCOUNTER — Other Ambulatory Visit: Payer: Self-pay

## 2021-07-24 DIAGNOSIS — Z1231 Encounter for screening mammogram for malignant neoplasm of breast: Secondary | ICD-10-CM | POA: Diagnosis not present

## 2021-07-29 DIAGNOSIS — R030 Elevated blood-pressure reading, without diagnosis of hypertension: Secondary | ICD-10-CM | POA: Diagnosis not present

## 2021-07-29 DIAGNOSIS — M5412 Radiculopathy, cervical region: Secondary | ICD-10-CM | POA: Diagnosis not present

## 2021-07-29 DIAGNOSIS — M5416 Radiculopathy, lumbar region: Secondary | ICD-10-CM | POA: Diagnosis not present

## 2021-07-29 DIAGNOSIS — M545 Low back pain, unspecified: Secondary | ICD-10-CM | POA: Diagnosis not present

## 2021-08-13 DIAGNOSIS — F322 Major depressive disorder, single episode, severe without psychotic features: Secondary | ICD-10-CM | POA: Diagnosis not present

## 2021-08-13 DIAGNOSIS — F4320 Adjustment disorder, unspecified: Secondary | ICD-10-CM | POA: Diagnosis not present

## 2021-08-28 ENCOUNTER — Encounter: Payer: Medicare PPO | Admitting: Psychology

## 2021-09-06 ENCOUNTER — Encounter: Payer: Medicare PPO | Admitting: Counselor

## 2021-09-16 ENCOUNTER — Ambulatory Visit (HOSPITAL_BASED_OUTPATIENT_CLINIC_OR_DEPARTMENT_OTHER): Payer: Medicare PPO | Admitting: Obstetrics & Gynecology

## 2021-09-18 ENCOUNTER — Encounter: Payer: Medicare PPO | Admitting: Counselor

## 2021-10-28 ENCOUNTER — Ambulatory Visit: Payer: Medicare PPO | Admitting: Physician Assistant

## 2021-12-27 DIAGNOSIS — R413 Other amnesia: Secondary | ICD-10-CM | POA: Diagnosis not present

## 2021-12-27 DIAGNOSIS — E7800 Pure hypercholesterolemia, unspecified: Secondary | ICD-10-CM | POA: Diagnosis not present

## 2021-12-27 DIAGNOSIS — F3342 Major depressive disorder, recurrent, in full remission: Secondary | ICD-10-CM | POA: Diagnosis not present

## 2021-12-27 DIAGNOSIS — E559 Vitamin D deficiency, unspecified: Secondary | ICD-10-CM | POA: Diagnosis not present

## 2021-12-27 DIAGNOSIS — Z0001 Encounter for general adult medical examination with abnormal findings: Secondary | ICD-10-CM | POA: Diagnosis not present

## 2021-12-27 DIAGNOSIS — R3915 Urgency of urination: Secondary | ICD-10-CM | POA: Diagnosis not present

## 2021-12-27 DIAGNOSIS — R300 Dysuria: Secondary | ICD-10-CM | POA: Diagnosis not present

## 2022-01-02 ENCOUNTER — Encounter: Payer: Self-pay | Admitting: Psychology

## 2022-01-02 DIAGNOSIS — H81312 Aural vertigo, left ear: Secondary | ICD-10-CM | POA: Insufficient documentation

## 2022-01-02 DIAGNOSIS — S0011XA Contusion of right eyelid and periocular area, initial encounter: Secondary | ICD-10-CM | POA: Insufficient documentation

## 2022-01-02 DIAGNOSIS — R42 Dizziness and giddiness: Secondary | ICD-10-CM | POA: Insufficient documentation

## 2022-01-02 HISTORY — DX: Aural vertigo, left ear: H81.312

## 2022-01-03 ENCOUNTER — Other Ambulatory Visit: Payer: Self-pay

## 2022-01-03 ENCOUNTER — Encounter: Payer: Self-pay | Admitting: Psychology

## 2022-01-03 ENCOUNTER — Ambulatory Visit (INDEPENDENT_AMBULATORY_CARE_PROVIDER_SITE_OTHER): Payer: Medicare PPO | Admitting: Psychology

## 2022-01-03 DIAGNOSIS — F411 Generalized anxiety disorder: Secondary | ICD-10-CM

## 2022-01-03 DIAGNOSIS — G3184 Mild cognitive impairment, so stated: Secondary | ICD-10-CM | POA: Diagnosis not present

## 2022-01-03 HISTORY — DX: Mild cognitive impairment of uncertain or unknown etiology: G31.84

## 2022-01-03 NOTE — Progress Notes (Signed)
? ?NEUROPSYCHOLOGICAL EVALUATION ?[place] ?[place] [facility] of Neurology ? ?Date of Evaluation: January 03, 2022 ? ?Reason for Referral:  ? ?Michelle Martinez is a 70 y.o. right-handed Caucasian female referred by  Sharene Butters, PA-C , to characterize her current cognitive functioning and assist with diagnostic clarity and treatment planning in the context of subjective cognitive decline and concerns for a neurodegenerative illness.  ? ?Assessment and Plan:  ? ?Clinical Impression(s): ?Michelle Martinez pattern of performance is suggestive of severe impairment surrounding all aspects of learning and memory. Additional weakness and/or performance variability was exhibited across executive functioning and semantic fluency. Performances were appropriate relative to age-matched peers across domains of processing speed, attention/concentration, safety/judgment, receptive language, phonemic fluency, confrontation naming, and visuospatial abilities. Michelle Martinez generally denied difficulties completing instrumental activities of daily living (ADLs) independently. However, she did note some trouble remembering to make a few bill payments in the recent past. Overall, given evidence for cognitive dysfunction described above, she meets criteria for a Mild Neurocognitive Disorder ("mild cognitive impairment") at the present time. ? ?While the current etiology is unclear at the present time, consideration unfortunately needs to be given for the early stages of Alzheimer's disease. Across memory testing, Michelle Martinez did not benefit from repeated exposure to information across learning trials. After brief delays, she was essentially amnestic (i.e., 0% to 17%) across all three memory tasks and performed poorly across yes/no recognition trials. Taken together, this suggests the presence of rapid forgetting and a significant memory storage deficit, both of which are the hallmark memory patterns in Alzheimer's  disease. Relative weaknesses in semantic fluency and executive functioning would also represent fairly typical disease progression. As such, continued medical monitoring will be important moving forward.  ? ?Recent neuroimaging did not suggest advanced microvascular changes, making a primary vascular etiology unlikely. Michelle Martinez also does not display common characteristics of Lewy body dementia, frontotemporal dementia, or another more rare parkinsonian condition at the present time. She did report mild to moderate symptoms of anxiety and depression across mood-related questionnaires. While these symptoms can impact day-to-day functioning, current amnestic memory scores are above and beyond what would be contributed to psychiatric symptoms alone. ? ?Recommendations: ?A repeat neuropsychological evaluation in 18 months (or sooner if functional decline is noted) is recommended to assess the trajectory of future cognitive decline should it occur. This will also aid in future efforts towards improved diagnostic clarity. ? ?Michelle Martinez is scheduled to meet with Michelle Martinez again on 01/14/2022. If she has remained off rivastigmine/Exelon, they should discuss medication intervention for memory loss during this appointment given current impairment and concerns expressed above. It is important to highlight that this medication has been shown to slow functional decline in some individuals. There is no current treatment which can stop or reverse cognitive decline when caused by a neurodegenerative illness. ? ?Should there be further progression of current deficits over time, she is unlikely to regain any independent living skills lost. Therefore, it is recommended that she remain as involved as possible in all aspects of household chores, finances, and medication management, with supervision to ensure adequate performance. She will likely benefit from the establishment and maintenance of a routine in order to maximize her  functional abilities over time. ? ?It will be important for Michelle Martinez to have another person with her when in situations where she may need to process information, weigh the pros and cons of different options, and make decisions, in order to ensure that she  fully understands and recalls all information to be considered. ? ?If not already done, Michelle Martinez and her family may want to discuss her wishes regarding durable power of attorney and medical decision making, so that she can have input into these choices. Additionally, they may wish to discuss future plans for caretaking and seek out community options for in home/residential care should they become necessary. ? ?Performance across neurocognitive testing is not a strong predictor of an individual's safety operating a motor vehicle. Should her family wish to pursue a formalized driving evaluation, they could reach out to the following agencies: ?[employer] in [place]: [phone] ?[facility]: [phone] ?[facility]: [phone] ?[facility]: [phone] or [phone] ? ?Michelle Martinez is encouraged to attend to lifestyle factors for brain health (e.g., regular physical exercise, good nutrition habits, regular participation in cognitively-stimulating activities, and general stress management techniques), which are likely to have benefits for both emotional adjustment and cognition. In fact, in addition to promoting good general health, regular exercise incorporating aerobic activities (e.g., brisk walking, jogging, cycling, etc.) has been demonstrated to be a very effective treatment for depression and stress, with similar efficacy rates to both antidepressant medication and psychotherapy. Optimal control of vascular risk factors (including safe cardiovascular exercise and adherence to dietary recommendations) is encouraged. Continued participation in activities which provide mental stimulation and social  interaction is also recommended.  ? ?Important information should be provided to Michelle Martinez in written format in all instances. This information should be placed in a highly frequented and easily visible location within her home to promote recall. External strategies such as written notes in a consistently used memory journal, visual and nonverbal auditory cues such as a calendar on the refrigerator or appointments with alarm, such as on a cell phone, can also help maximize recall. ? ?To address problems with executive dysfunction, she may wish to consider: ?  -Avoiding external distractions when needing to concentrate ?  -Limiting exposure to fast paced environments with multiple sensory demands ?  -Writing down complicated information and using checklists ?  -Attempting and completing one task at a time (i.e., no multi-tasking) ?  -Verbalizing aloud each step of a task to maintain focus ?  -Reducing the amount of information considered at one time ? ?Review of Records:  ? ?Michelle Martinez was seen by [facility] Sharene Butters, PA-C) on 05/13/2021 for an evaluation of memory loss. At that time, memory concerns were said to be present for the past 5-6 years. However, it was unclear how much concerns were due to mood and stress factors as she had been caring for her husband who had ALS for a large portion of that time. Examples of memory concerns included trouble remembering to take medications and pay bills from time to time, as well as more generalized day-to-day memory lapses. Records suggest that she was on the rivastigmine patch at some point; however, she stated that she ran out of it and had been forgetting to take her doses. Performance on a brief cognitive screening instrument (MOCA) was 18/30. Ultimately, Michelle Martinez was referred for a comprehensive neuropsychological evaluation to characterize her cognitive abilities and to assist with diagnostic clarity and treatment planning.  ? ?Brain MRI on 06/12/2021  revealed mild microvascular ischemia, said to have progressed since 2020, as well as generalized mild atrophy. 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  ? ?Past Medical History:  ?Diag

## 2022-01-06 ENCOUNTER — Encounter: Payer: Self-pay | Admitting: Psychology

## 2022-01-10 ENCOUNTER — Encounter: Payer: Medicare PPO | Admitting: Counselor

## 2022-01-10 ENCOUNTER — Ambulatory Visit (INDEPENDENT_AMBULATORY_CARE_PROVIDER_SITE_OTHER): Payer: Medicare PPO | Admitting: Psychology

## 2022-01-10 ENCOUNTER — Other Ambulatory Visit: Payer: Self-pay

## 2022-01-10 DIAGNOSIS — G3184 Mild cognitive impairment, so stated: Secondary | ICD-10-CM | POA: Diagnosis not present

## 2022-01-10 DIAGNOSIS — F411 Generalized anxiety disorder: Secondary | ICD-10-CM | POA: Diagnosis not present

## 2022-01-10 NOTE — Progress Notes (Signed)
? ?  Neuropsychology Feedback Session ?[place]. [facility] ?[place] [facility] of Neurology ? ?Reason for Referral:  ? ?Michelle Martinez is a 70 y.o. right-handed Caucasian female referred by  Sharene Butters, PA-C , to characterize her current cognitive functioning and assist with diagnostic clarity and treatment planning in the context of subjective cognitive decline and concerns for a neurodegenerative illness.  ? ?Feedback:  ? ?Michelle Martinez completed a comprehensive neuropsychological evaluation on 01/03/2022. Please refer to that encounter for the full report and recommendations. Briefly, results suggested severe impairment surrounding all aspects of learning and memory. Additional weakness and/or performance variability was exhibited across executive functioning and semantic fluency. While the current etiology is unclear at the present time, consideration unfortunately needs to be given for the early stages of Alzheimer's disease. Across memory testing, Michelle Martinez did not benefit from repeated exposure to information across learning trials. After brief delays, she was essentially amnestic (i.e., 0% to 17%) across all three memory tasks and performed poorly across yes/no recognition trials. Taken together, this suggests the presence of rapid forgetting and a significant memory storage deficit, both of which are the hallmark memory patterns in Alzheimer's disease. ? ?Michelle Martinez was accompanied by her son during the current feedback session. Content of the current session focused on the results of her neuropsychological evaluation. Michelle Martinez was given the opportunity to ask questions and her questions were answered. She was encouraged to reach out should additional questions arise. A copy of her report was provided at the conclusion of the visit.  ? ?  ? ?40 minutes were spent conducting the current feedback session with Michelle Martinez, billed as one unit [phone].  ?

## 2022-01-14 ENCOUNTER — Other Ambulatory Visit: Payer: Self-pay

## 2022-01-14 ENCOUNTER — Encounter: Payer: Self-pay | Admitting: Physician Assistant

## 2022-01-14 ENCOUNTER — Ambulatory Visit: Payer: Medicare PPO | Admitting: Physician Assistant

## 2022-01-14 VITALS — BP 110/70 | HR 76 | Resp 18 | Ht 63.0 in | Wt 116.0 lb

## 2022-01-14 DIAGNOSIS — G3184 Mild cognitive impairment, so stated: Secondary | ICD-10-CM | POA: Diagnosis not present

## 2022-01-14 NOTE — Patient Instructions (Addendum)
It was a pleasure to see you today at our office.  ? ?Recommendations: ? ?Continue Rivastigmine patch  ?Follow up in 9 months  ? ?RECOMMENDATIONS FOR ALL PATIENTS WITH MEMORY PROBLEMS: ?1. Continue to exercise (Recommend 30 minutes of walking everyday, or 3 hours every week) ?2. Increase social interactions - continue going to [place] and enjoy social gatherings with friends and family ?3. Eat healthy, avoid fried foods and eat more fruits and vegetables ?4. Maintain adequate blood pressure, blood sugar, and blood cholesterol level. Reducing the risk of stroke and cardiovascular disease also helps promoting better memory. ?5. Avoid stressful situations. Live a simple life and avoid aggravations. Organize your time and prepare for the next day in anticipation. ?6. Sleep well, avoid any interruptions of sleep and avoid any distractions in the bedroom that may interfere with adequate sleep quality ?7. Avoid sugar, avoid sweets as there is a strong link between excessive sugar intake, diabetes, and cognitive impairment ?We discussed the Mediterranean diet, which has been shown to help patients reduce the risk of progressive memory disorders and reduces cardiovascular risk. This includes eating fish, eat fruits and green leafy vegetables, nuts like almonds and hazelnuts, walnuts, and also use olive oil. Avoid fast foods and fried foods as much as possible. Avoid sweets and sugar as sugar use has been linked to worsening of memory function. ? ?There is always a concern of gradual progression of memory problems. If this is the case, then we may need to adjust level of care according to patient needs. Support, both to the patient and caregiver, should then be put into place.  ? ?  ? ?FALL PRECAUTIONS: Be cautious when walking. Scan the area for obstacles that may increase the risk of trips and falls. When getting up in the mornings, sit up at the edge of the bed for a few minutes before getting out of bed. Consider elevating  the bed at the head end to avoid drop of blood pressure when getting up. Walk always in a well-lit room (use night lights in the walls). Avoid area rugs or power cords from appliances in the middle of the walkways. Use a walker or a cane if necessary and consider physical therapy for balance exercise. Get your eyesight checked regularly. ? ?FINANCIAL OVERSIGHT: Supervision, especially oversight when making financial decisions or transactions is also recommended. ? ?HOME SAFETY: Consider the safety of the kitchen when operating appliances like stoves, microwave oven, and blender. Consider having supervision and share cooking responsibilities until no longer able to participate in those. Accidents with firearms and other hazards in the house should be identified and addressed as well. ? ? ?ABILITY TO BE LEFT ALONE: If patient is unable to contact 911 operator, consider using LifeLine, or when the need is there, arrange for someone to stay with patients. Smoking is a fire hazard, consider supervision or cessation. Risk of wandering should be assessed by caregiver and if detected at any point, supervision and safe proof recommendations should be instituted. ? ?MEDICATION SUPERVISION: Inability to self-administer medication needs to be constantly addressed. Implement a mechanism to ensure safe administration of the medications. ? ? ?DRIVING: Regarding driving, in patients with progressive memory problems, driving will be impaired. We advise to have someone else do the driving if trouble finding directions or if minor accidents are reported. Independent driving assessment is available to determine safety of driving. ? ? ?If you are interested in the driving assessment, you can contact the following: ? ?[facility] [employer]  in [place] ? ?[place] [phone] ? ?[facility] [phone] ? ?[facility] [phone] or [phone] ? ? ? ?Mediterranean Diet ?A  Mediterranean diet refers to food and lifestyle choices that are based on the traditions of countries located on the [employer]. This way of eating has been shown to help prevent certain conditions and improve outcomes for people who have chronic diseases, like kidney disease and heart disease. ?What are tips for following this plan? ?Lifestyle  ?Cook and eat meals together with your family, when possible. ?Drink enough fluid to keep your urine clear or pale yellow. ?Be physically active every day. This includes: ?Aerobic exercise like running or swimming. ?Leisure activities like gardening, walking, or housework. ?Get 7-8 hours of sleep each night. ?If recommended by your health care provider, drink red wine in moderation. This means 1 glass a day for nonpregnant women and 2 glasses a day for men. A glass of wine equals 5 oz (150 mL). ?Reading food labels  ?Check the serving size of packaged foods. For foods such as rice and pasta, the serving size refers to the amount of cooked product, not dry. ?Check the total fat in packaged foods. Avoid foods that have saturated fat or trans fats. ?Check the ingredients list for added sugars, such as corn syrup. ?Shopping  ?At the grocery store, buy most of your food from the areas near the walls of the store. This includes: ?Fresh fruits and vegetables (produce). ?Grains, beans, nuts, and seeds. Some of these may be available in unpackaged forms or large amounts (in bulk). ?Fresh seafood. ?Poultry and eggs. ?Low-fat dairy products. ?Buy whole ingredients instead of prepackaged foods. ?Buy fresh fruits and vegetables in-season from local farmers markets. ?Buy frozen fruits and vegetables in resealable bags. ?If you do not have access to quality fresh seafood, buy precooked frozen shrimp or canned fish, such as tuna, salmon, or sardines. ?Buy small amounts of raw or cooked vegetables, salads, or olives from the deli or salad bar at your store. ?Stock your pantry so you  always have certain foods on hand, such as olive oil, canned tuna, canned tomatoes, rice, pasta, and beans. ?Cooking  ?Cook foods with extra-virgin olive oil instead of using butter or other vegetable oils. ?Have meat as a side dish, and have vegetables or grains as your main dish. This means having meat in small portions or adding small amounts of meat to foods like pasta or stew. ?Use beans or vegetables instead of meat in common dishes like chili or lasagna. ?Experiment with different cooking methods. Try roasting or broiling vegetables instead of steaming or saut?eing them. ?Add frozen vegetables to soups, stews, pasta, or rice. ?Add nuts or seeds for added healthy fat at each meal. You can add these to yogurt, salads, or vegetable dishes. ?Marinate fish or vegetables using olive oil, lemon juice, garlic, and fresh herbs. ?Meal planning  ?Plan to eat 1 vegetarian meal one day each week. Try to work up to 2 vegetarian meals, if possible. ?Eat seafood 2 or more times a week. ?Have healthy snacks readily available, such as: ?Vegetable sticks with hummus. ?[place] yogurt. ?Fruit and nut trail mix. ?Eat balanced meals throughout the week. This includes: ?Fruit: 2-3 servings a day ?Vegetables: 4-5 servings a day ?Low-fat dairy: 2 servings a day ?Fish, poultry, or lean meat: 1 serving a day ?Beans and legumes: 2 or more servings a week ?Nuts and seeds: 1-2 servings a day ?Whole grains: 6-8 servings a day ?Extra-virgin olive  oil: 3-4 servings a day ?Limit red meat and sweets to only a few servings a month ?What are my food choices? ?Mediterranean diet ?Recommended ?Grains: Whole-grain pasta. Brown rice. Bulgar wheat. Polenta. Couscous. Whole-wheat bread. [place]. ?Vegetables: Artichokes. Beets. Broccoli. Cabbage. Carrots. Eggplant. Green beans. Chard. Kale. Spinach. Onions. Leeks. Peas. Squash. Tomatoes. Peppers. Radishes. ?Fruits: Apples. Apricots. Avocado. Berries. Bananas. Cherries. Dates. Figs. Grapes. Lemons.  Melon. Oranges. Peaches. Plums. Pomegranate. ?Meats and other protein foods: Beans. Almonds. Sunflower seeds. Pine nuts. Peanuts. [place]. Salmon. Scallops. Shrimp. [place]. Tilapia. Clams. Oysters. Eggs. ?Dairy: L

## 2022-01-14 NOTE — Progress Notes (Signed)
? ?Assessment/Plan:  ? ? 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 ? Recommendations:  ? ?Discussed safety both in and out of the home.  ?Discussed the importance of regular daily schedule with inclusion of crossword puzzles to maintain brain function.  ?Continue to monitor mood by PCP ?Stay active at least 30 minutes at least 3 times a week.  ?Naps should be scheduled and should be no longer than 60 minutes and should not occur after 2 PM.  ?Mediterranean diet is recommended  ?Control cardiovascular risk factors  ?Continue rivastigmine patch 4.6 mg TD by PCP Side effects were discussed ?Repeat neurocognitive testing in 18 months  ?Follow up in 6-9  months. ? ? ?Case discussed with Dr. Delice Lesch who agrees with the plan ? ?  ? ? ?Subjective:  ? ? ?Michelle Martinez is a very pleasant 71 y.o. RH female 18/30 hypertension, cervical myelopathy, hyperlipidemia, CAD, anxiety, depression and possible dementia seen today in follow up for memory loss. This patient is accompanied is here alone. Previous records as well as any outside records available were reviewed prior to todays visit.  Patient was last seen at our office on  04/23/21 at which time her MoCA was 18/30 . ?Patient is currently on rivastigmine patch 4.6 mg TD by PCP.  Since her last visit, the patient had an evaluation on 01/03/2022 by Dr. Melvyn Novas, with a diagnosis of MCI, likely due to Alzheimer's disease.  Since last visit, the patient has moved to [address] and [place], and allows her to feel very social, interacting with the other residents.  She had a spine surgery, which was successful, and allows her to have increased mobility, and she is walking frequently.  She reports that her memory is about the same as it was during her last visit.  She is learning to cope with the diagnosis. ? "It does not help with my friends asked me what did you say? or what you were trying to say? ".  She denies  irritability.  She sleeps well, denying vivid dreams or sleepwalking.  She has changed her bed, and allows her to rest more comfortably.  Denies hallucinations or paranoia.  She does not need assistance with bathing and dressing.  She is independent with her medications, placing them in the pillbox, denies missing any doses.  She is in charge of her bills, and denies missing any payments. There is an application called Words of Wonder, which helps her to play memory games. Denies living objects in unusual places. Appetite is good, and denies trouble swallowing.  She does not like to cook much.  Denies leaving the stove or the faucet on.  Ambulates without difficulty without the use of a cane or a walker.  She drives without getting lost and uses a GPS.  She has occasional headaches, which she attributes to caffeine, which are resolved with drinking a cup of coffee (she drinks 2 coffees a day and of parenthesis.  She denies any falls or injuries to the head, double vision, dizziness, focal numbness or tingling, unilateral weakness or tremors.  She denies urine incontinence or retention, constipation or diarrhea.   ?  ?  ? Initial Visit 05/13/21 The patient is seen in neurologic consultation at the request of Josetta Huddle, MD for the evaluation of memory. The patient is here alone.  ?she is a 71 y.o. year old female who has had memory issues for about 5 or 6 years, at which  time she was taking care of her dying husband to ALS.  She was seen by Dr. Jaynee Eagles from [facility] neurological, and placed initially on Aricept.  In the interim, her husband died, she had multiple procedures, including discectomy, lumpectomy and colostomy, all these procedures without malignancy.  Her PCP noticed some changes in her memory, and felt that she needed to have a new neurological evaluation.  The patient may have been on rivastigmine patch at some point, although she states that she ran out of it.  She states that she was taking the patch  because she was forgetting to take her doses.  She lives alone, with 2 sons nearby, and good neighbors.  She is a retired Mudlogger at [employer].  Overall, she tries to stay active, helping one of her sons in the restaurant, but she has been feeling more depressed, especially when she is trying to remember and the memory "failed ".  "It does not help with my friends asked me what did you say? or what you were trying to say? ".  She denies irritability.  She sleeps well, denying vivid dreams or sleepwalking.  Denies hallucinations or paranoia.  She does not need assistance with bathing and dressing.  As mentioned above, she forgets at times to take her medications, because she does not like them.  At times, she forgets to pay bills.  She had one incident recently, which made her aware of her memory loss.  There is an application called Words of Wonder, which helps her to play memory games.  She kept playing, but she forgot to pay the [place] bill, almost leading her to collection.  She now lives herself a note.    ?Denies living objects in unusual places. Appetite is good, "not very healthy ".  Denies trouble swallowing.  She does not like to cook much.  Denies leaving the stove or the faucet on.  Ambulates without difficulty without the use of a cane or a walker.  She drives without getting lost and uses a GPS.  She has occasional headaches, which she attributes to caffeine, which are resolved with drinking a cup of coffee (she drinks 2 coffees a day and of parenthesis.  She denies any falls or injuries to the head, double vision, dizziness, focal numbness or tingling, unilateral weakness or tremors.  She denies urine incontinence or retention, constipation or diarrhea.  Family history remarkable for Lewy body dementia in her father.  She denies a history of OSA, alcohol, or tobacco.  She has 2 children, has a college degree. ?  ?  ?MRI brain 08/09/19 ? ?IMPRESSION: This MRI of the brain with and  without contrast shows the following: ?1.   Mild chronic microvascular ischemic changes in the pons and hemispheres. ?2.   Minimal generalized cortical atrophy, likely normal for age. ?3.   There is a hyperintense focus on coronal diffusion-weighted images with a corresponding hypointensity on ADC mapping adjacent to the central sulcus on the left that would be worrisome for a small subacute infarction.  However, as the same region on axial diffusion-weighted images and T2/flair images is normal, this likely represents artifact. ?4.   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 ? Neuropsychological evaluation Dr. Melvyn Novas  01/03/22 Briefly, results suggested severe impairment surrounding all aspects of learning and memory. Additional weakness and/or performance variability was exhibited across executive functioning and semantic fluency. While the current etiology is unclear at the present time, consideration unfortunately needs to  be given for the early stages of Alzheimer's disease. Across memory testing, Michelle Martinez did not benefit from repeated exposure to information across learning trials. After brief delays, she was essentially amnestic (i.e., 0% to 17%) across all three memory tasks and performed poorly across yes/no recognition trials. Taken together, this suggests the presence of rapid forgetting and a significant memory storage deficit, both of which are the hallmark memory patterns in Alzheimer's disease. ? ?PREVIOUS MEDICATIONS:  ? ?CURRENT MEDICATIONS:  ?Outpatient Encounter Medications as of 01/14/2022  ?Medication Sig  ? buPROPion (WELLBUTRIN XL) 150 MG 24 hr tablet TAKE 1 TABLET(150 MG) BY MOUTH DAILY  ? Cholecalciferol (VITAMIN D3 PO) Take 25 mg by mouth daily.  ? Diclofenac-miSOPROStol 75-0.2 MG TBEC TK 1 T PO QD  ? diphenhydrAMINE (BENADRYL) 25 MG tablet Take 25 mg by mouth every 8 (eight) hours as needed for itching or allergies.   ? DULoxetine (CYMBALTA) 60 MG capsule TAKE 1  CAPSULE(60 MG) BY MOUTH DAILY  ? famotidine (PEPCID) 20 MG tablet Take 20 mg by mouth daily as needed for heartburn or indigestion.  ? fluticasone (FLONASE) 50 MCG/ACT nasal spray Place 2 sprays into both nostrils

## 2022-03-21 DIAGNOSIS — E7800 Pure hypercholesterolemia, unspecified: Secondary | ICD-10-CM | POA: Diagnosis not present

## 2022-04-15 DIAGNOSIS — F0390 Unspecified dementia without behavioral disturbance: Secondary | ICD-10-CM | POA: Diagnosis not present

## 2022-04-15 DIAGNOSIS — M1990 Unspecified osteoarthritis, unspecified site: Secondary | ICD-10-CM | POA: Diagnosis not present

## 2022-04-15 DIAGNOSIS — R413 Other amnesia: Secondary | ICD-10-CM | POA: Diagnosis not present

## 2022-04-15 DIAGNOSIS — H81312 Aural vertigo, left ear: Secondary | ICD-10-CM | POA: Diagnosis not present

## 2022-04-15 DIAGNOSIS — R3915 Urgency of urination: Secondary | ICD-10-CM | POA: Diagnosis not present

## 2022-04-15 DIAGNOSIS — R300 Dysuria: Secondary | ICD-10-CM | POA: Diagnosis not present

## 2022-04-15 DIAGNOSIS — E7800 Pure hypercholesterolemia, unspecified: Secondary | ICD-10-CM | POA: Diagnosis not present

## 2022-04-15 DIAGNOSIS — E559 Vitamin D deficiency, unspecified: Secondary | ICD-10-CM | POA: Diagnosis not present

## 2022-04-15 DIAGNOSIS — F3342 Major depressive disorder, recurrent, in full remission: Secondary | ICD-10-CM | POA: Diagnosis not present

## 2022-04-16 DIAGNOSIS — Z86018 Personal history of other benign neoplasm: Secondary | ICD-10-CM | POA: Diagnosis not present

## 2022-04-16 DIAGNOSIS — D225 Melanocytic nevi of trunk: Secondary | ICD-10-CM | POA: Diagnosis not present

## 2022-04-16 DIAGNOSIS — L578 Other skin changes due to chronic exposure to nonionizing radiation: Secondary | ICD-10-CM | POA: Diagnosis not present

## 2022-04-16 DIAGNOSIS — D485 Neoplasm of uncertain behavior of skin: Secondary | ICD-10-CM | POA: Diagnosis not present

## 2022-04-16 DIAGNOSIS — D2261 Melanocytic nevi of right upper limb, including shoulder: Secondary | ICD-10-CM | POA: Diagnosis not present

## 2022-04-16 DIAGNOSIS — D2272 Melanocytic nevi of left lower limb, including hip: Secondary | ICD-10-CM | POA: Diagnosis not present

## 2022-04-16 DIAGNOSIS — D2230 Melanocytic nevi of unspecified part of face: Secondary | ICD-10-CM | POA: Diagnosis not present

## 2022-04-16 DIAGNOSIS — Z86008 Personal history of in-situ neoplasm of other site: Secondary | ICD-10-CM | POA: Diagnosis not present

## 2022-04-16 DIAGNOSIS — Z85828 Personal history of other malignant neoplasm of skin: Secondary | ICD-10-CM | POA: Diagnosis not present

## 2022-04-16 DIAGNOSIS — D2271 Melanocytic nevi of right lower limb, including hip: Secondary | ICD-10-CM | POA: Diagnosis not present

## 2022-05-12 ENCOUNTER — Ambulatory Visit
Admission: RE | Admit: 2022-05-12 | Discharge: 2022-05-12 | Disposition: A | Discharge status: Home / Self Care | Payer: Medicare PPO | Source: Ambulatory Visit | Attending: Internal Medicine | Admitting: Internal Medicine

## 2022-05-12 ENCOUNTER — Other Ambulatory Visit: Payer: Self-pay | Admitting: Internal Medicine

## 2022-05-12 DIAGNOSIS — R002 Palpitations: Secondary | ICD-10-CM

## 2022-05-12 DIAGNOSIS — R0602 Shortness of breath: Secondary | ICD-10-CM | POA: Diagnosis not present

## 2022-05-20 DIAGNOSIS — R002 Palpitations: Secondary | ICD-10-CM | POA: Diagnosis not present

## 2022-05-23 DIAGNOSIS — R002 Palpitations: Secondary | ICD-10-CM | POA: Diagnosis not present

## 2022-07-01 DIAGNOSIS — D485 Neoplasm of uncertain behavior of skin: Secondary | ICD-10-CM | POA: Diagnosis not present

## 2022-07-01 DIAGNOSIS — L988 Other specified disorders of the skin and subcutaneous tissue: Secondary | ICD-10-CM | POA: Diagnosis not present

## 2022-08-07 ENCOUNTER — Other Ambulatory Visit: Payer: Self-pay | Admitting: Internal Medicine

## 2022-08-07 DIAGNOSIS — Z1231 Encounter for screening mammogram for malignant neoplasm of breast: Secondary | ICD-10-CM

## 2022-09-25 ENCOUNTER — Ambulatory Visit
Admission: RE | Admit: 2022-09-25 | Discharge: 2022-09-25 | Disposition: A | Discharge status: Home / Self Care | Payer: Medicare PPO | Source: Ambulatory Visit | Attending: Internal Medicine | Admitting: Internal Medicine

## 2022-09-25 DIAGNOSIS — Z1231 Encounter for screening mammogram for malignant neoplasm of breast: Secondary | ICD-10-CM | POA: Diagnosis not present

## 2022-10-17 ENCOUNTER — Ambulatory Visit: Payer: Medicare PPO | Admitting: Physician Assistant

## 2022-10-17 ENCOUNTER — Encounter: Payer: Self-pay | Admitting: Physician Assistant

## 2022-10-17 VITALS — BP 106/67 | HR 69 | Ht 63.0 in | Wt 106.0 lb

## 2022-10-17 DIAGNOSIS — R413 Other amnesia: Secondary | ICD-10-CM

## 2022-10-17 DIAGNOSIS — G3184 Mild cognitive impairment, so stated: Secondary | ICD-10-CM | POA: Diagnosis not present

## 2022-10-17 NOTE — Progress Notes (Incomplete)
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 RH female presenting today in follow-up for evaluation of memory loss. Patient is on ***  Personally reviewed of the brain ***   Recommendations:   Follow up in   months. Continue rivastigmine patch 4.6 mg TD by PCP Repeat Neuropsych testing in 12 months     Subjective:   This patient is accompanied in the office by ***  who supplements the history. Previous records as well as any outside records available were reviewed prior to todays visit.   Patient was last seen on ***  Huge weight loss x 1 y    Any changes in memory since last visit? Now I am aware and anxious because of the memory loss. Socially active.extroverted. Patient able to remembering recent conversations and people names repeats oneself?  Endorsed by sons Disoriented when walking into a room?  Patient denies except occasionally not remembering what patient came to the room for ***  Leaving objects in unusual places?  Patient denies   Wandering behavior?   denies   Any personality changes since last visit?  Situational  anxiety. Any worsening depression?:  "Never been the same after his death. I avoid conflict".  Hallucinations or paranoia?  denies   Seizures?   denies    Any sleep changes?  Denies  vivid dreams, REM behavior or sleepwalking  Sleeps in a m medical recliner  Sleep apnea?   denies   Any hygiene concerns?   denies   Independent of bathing and dressing?  Endorsed  Does the patient needs help with medications? Patient is in charge *** Who is in charge of the finances?  Patient is  is in charge   *** Any changes in appetite?  denies ***   Patient have trouble swallowing?  denies   Does the patient cook?  Any kitchen accidents such as leaving the stove on?   denies   Any headaches?    denies   Vision changes? denies Chronic back pain  denies   Ambulates with difficulty?    denies    Recent falls or head injuries?    denies     Unilateral weakness, numbness or tingling?   denies   Any tremors?  denies   Any anosmia?    denies   Any incontinence of urine?  denies   Any bowel dysfunction?  denies      Patient lives  *** Does the patient drive?***no issues , always been terrible with directions.   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   Cervical myelopathy 11/24/2018   Cervical radiculopathy 11/24/2018   Closed fracture of distal end of right radius 73/41/9379   Complication of anesthesia    hypotension; difficulty waking up   Dupuytren's contracture    Dysrhythmia    occasional rapid heart beat   Fatigue    Generalized anxiety disorder    GERD (gastroesophageal reflux disease)    Headache    occasional sinus hedache   Herniation of nucleus pulposus, lumbar 11/24/2018   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    S/p partial colectomy 6/18   Insomnia    Lightheadedness    Low back pain 06/22/2013   Major depressive disorder    Melanoma  Neuropathy 05/30/2019   Orthostatic hypotension 03/02/2013   Osteoarthritis 03/02/2013   Osteopenia    Osteoporosis    Pain in limb    Palpitation    Periorbital ecchymosis of right eye    Personal history of malignant neoplasm of breast    PONV (postoperative nausea and vomiting)    Pure hypercholesterolemia 03/25/2011   Spinal stenosis in cervical region 11/24/2018   Syncope    Vitamin D deficiency      Past Surgical History:  Procedure Laterality Date   ANTERIOR CERVICAL DECOMP/DISCECTOMY FUSION N/A 12/23/2018   Procedure: Cervical 3-4 Cervical 4-5 Anterior cervical decompression/discectomy/fusion;  Surgeon: Erline Levine, MD;  Location: [facility];  Service: Neurosurgery;  Laterality: N/A;  Cervical 3-4 Cervical 4-5 Anterior cervical  decompression/discectomy/fusion   BREAST LUMPECTOMY WITH RADIOACTIVE SEED LOCALIZATION Right 12/11/2016   Procedure: RIGHT BREAST LUMPECTOMY WITH RADIOACTIVE SEED LOCALIZATION;  Surgeon: Autumn Messing III, MD;  Location: [facility];  Service: General;  Laterality: Right;   [place] RIGHT COLECTOMY Right 03/20/2017   Procedure: LAPAROSCOPIC ASSISTED RIGHT COLECTOMY;  Surgeon: Jovita Kussmaul, MD;  Location: [facility];  Service: General;  Laterality: Right;   LUMBAR FUSION  12/15/2012   Dr Vertell Limber   melanoma procedures     Dr. Ann Maki & Dr. Delman Cheadle   TONSILLECTOMY     TOTAL ABDOMINAL HYSTERECTOMY W/ BILATERAL SALPINGOOPHORECTOMY     Part of one ovary remians     PREVIOUS MEDICATIONS:   CURRENT MEDICATIONS:  Outpatient Encounter Medications as of 10/17/2022  Medication Sig   buPROPion (WELLBUTRIN XL) 150 MG 24 hr tablet TAKE 1 TABLET(150 MG) BY MOUTH DAILY   Cholecalciferol (VITAMIN D3 PO) Take 25 mg by mouth daily.   Diclofenac-miSOPROStol 75-0.2 MG TBEC TK 1 T PO QD   diphenhydrAMINE (BENADRYL) 25 MG tablet Take 25 mg by mouth every 8 (eight) hours as needed for itching or allergies.    DULoxetine (CYMBALTA) 60 MG capsule TAKE 1 CAPSULE(60 MG) BY MOUTH DAILY   famotidine (PEPCID) 20 MG tablet Take 20 mg by mouth daily as needed for heartburn or indigestion.   fluticasone (FLONASE) 50 MCG/ACT nasal spray Place 2 sprays into both nostrils every morning.   hydrocortisone (ANUSOL-HC) 2.5 % rectal cream Place 1 application rectally 2 (two) times daily.   OVER THE COUNTER MEDICATION as needed. Tylenol Sinus   psyllium (METAMUCIL) 58.6 % powder Take 1 packet by mouth daily.   rivastigmine (EXELON) 4.6 mg/24hr APPLY 1 PATCH TOPICALLY TO THE SKIN EVERY DAY   zoledronic acid (RECLAST) 5 MG/100ML SOLN injection Inject 5 mg into the vein. annually   No facility-administered encounter medications on file as of 10/17/2022.     Objective:     PHYSICAL  EXAMINATION:    VITALS:  There were no vitals filed for this visit.  GEN:  The patient appears stated age and is in NAD. HEENT:  Normocephalic, atraumatic.   Neurological examination:  General: NAD, well-groomed, appears stated age. Orientation: The patient is alert. Oriented to person, place and date Cranial nerves: There is good facial symmetry.The speech is fluent and clear. No aphasia or dysarthria. Fund of knowledge is appropriate. Recent memory impaired and remote memory is normal.  Attention and concentration are normal.  Able to name objects and repeat phrases.  Hearing is intact to conversational tone.   Delayed recall *** Sensation: Sensation is intact to light touch throughout Motor: Strength is at least antigravity  x4. Tremors: none  DTR's 2/4 in UE/LE      05/13/2021    2:00 PM  Montreal Cognitive Assessment   Visuospatial/ Executive (0/5) 2  Naming (0/3) 3  Attention: Read list of digits (0/2) 2  Attention: Read list of letters (0/1) 1  Attention: Serial 7 subtraction starting at 100 (0/3) 2  Language: Repeat phrase (0/2) 1  Language : Fluency (0/1) 1  Abstraction (0/2) 2  Delayed Recall (0/5) 0  Orientation (0/6) 4  Total 18  Adjusted Score (based on education) 18       07/20/2019    8:02 AM  MMSE - Mini Mental State Exam  Orientation to time 5  Orientation to Place 4  Registration 3  Attention/ Calculation 2  Recall 1  Language- name 2 objects 2  Language- repeat 1  Language- follow 3 step command 3  Language- read & follow direction 1  Write a sentence 1  Copy design 1  Total score 24       Movement examination: Tone: There is normal tone in the UE/LE Abnormal movements:  no tremor.  No myoclonus.  No asterixis.   Coordination:  There is no decremation with RAM's. Normal finger to nose  Gait and Station: The patient has no difficulty arising out of a deep-seated chair without the use of the hands. The patient's stride length is good.  Gait is  cautious and narrow.   Thank you for allowing [place] the opportunity to participate in the care of this nice patient. Please do not hesitate to contact us for any questions or concerns.   Total time spent on today's visit was *** minutes dedicated to this patient today, preparing to see patient, examining the patient, ordering tests and/or medications and counseling the patient, documenting clinical information in the EHR or other health record, independently interpreting results and communicating results to the patient/family, discussing treatment and goals, answering patient's questions and coordinating care.  Cc:  Josetta Huddle, MD  Sharene Butters 10/17/2022 7:46 AM

## 2022-10-17 NOTE — Patient Instructions (Addendum)
It was a pleasure to see you today at our office.   Recommendations:  Continue Rivastigmine patch  Follow up in 04/20/23 at 11:30  Consider psychotherapy, and psychiatrist for medications  Repeat the neuropsychological evaluation in 12 month  RECOMMENDATIONS FOR ALL PATIENTS WITH MEMORY PROBLEMS: 1. Continue to exercise (Recommend 30 minutes of walking everyday, or 3 hours every week) 2. Increase social interactions - continue going to [place] and enjoy social gatherings with friends and family 3. Eat healthy, avoid fried foods and eat more fruits and vegetables 4. Maintain adequate blood pressure, blood sugar, and blood cholesterol level. Reducing the risk of stroke and cardiovascular disease also helps promoting better memory. 5. Avoid stressful situations. Live a simple life and avoid aggravations. Organize your time and prepare for the next day in anticipation. 6. Sleep well, avoid any interruptions of sleep and avoid any distractions in the bedroom that may interfere with adequate sleep quality 7. Avoid sugar, avoid sweets as there is a strong link between excessive sugar intake, diabetes, and cognitive impairment We discussed the Mediterranean diet, which has been shown to help patients reduce the risk of progressive memory disorders and reduces cardiovascular risk. This includes eating fish, eat fruits and green leafy vegetables, nuts like almonds and hazelnuts, walnuts, and also use olive oil. Avoid fast foods and fried foods as much as possible. Avoid sweets and sugar as sugar use has been linked to worsening of memory function.  There is always a concern of gradual progression of memory problems. If this is the case, then we may need to adjust level of care according to patient needs. Support, both to the patient and caregiver, should then be put into place.      FALL PRECAUTIONS: Be cautious when walking. Scan the area for obstacles that may increase the risk of trips and falls. When  getting up in the mornings, sit up at the edge of the bed for a few minutes before getting out of bed. Consider elevating the bed at the head end to avoid drop of blood pressure when getting up. Walk always in a well-lit room (use night lights in the walls). Avoid area rugs or power cords from appliances in the middle of the walkways. Use a walker or a cane if necessary and consider physical therapy for balance exercise. Get your eyesight checked regularly.  FINANCIAL OVERSIGHT: Supervision, especially oversight when making financial decisions or transactions is also recommended.  HOME SAFETY: Consider the safety of the kitchen when operating appliances like stoves, microwave oven, and blender. Consider having supervision and share cooking responsibilities until no longer able to participate in those. Accidents with firearms and other hazards in the house should be identified and addressed as well.   ABILITY TO BE LEFT ALONE: If patient is unable to contact 911 operator, consider using LifeLine, or when the need is there, arrange for someone to stay with patients. Smoking is a fire hazard, consider supervision or cessation. Risk of wandering should be assessed by caregiver and if detected at any point, supervision and safe proof recommendations should be instituted.  MEDICATION SUPERVISION: Inability to self-administer medication needs to be constantly addressed. Implement a mechanism to ensure safe administration of the medications.   DRIVING: Regarding driving, in patients with progressive memory problems, driving will be impaired. We advise to have someone else do the driving if trouble finding directions or if minor accidents are reported. Independent driving assessment is available to determine safety of driving.   If you are  interested in the driving assessment, you can contact the following:  [employer] in [place]  [place]  [place] [phone] or [phone]    [place] refers to food and lifestyle choices that are based on the traditions of countries located on the [employer]. This way of eating has been shown to help prevent certain conditions and improve outcomes for people who have chronic diseases, like kidney disease and heart disease. What are tips for following this plan? Lifestyle  Cook and eat meals together with your family, when possible. Drink enough fluid to keep your urine clear or pale yellow. Be physically active every day. This includes: Aerobic exercise like running or swimming. Leisure activities like gardening, walking, or housework. Get 7-8 hours of sleep each night. If recommended by your health care provider, drink red wine in moderation. This means 1 glass a day for nonpregnant women and 2 glasses a day for men. A glass of wine equals 5 oz (150 mL). Reading food labels  Check the serving size of packaged foods. For foods such as rice and pasta, the serving size refers to the amount of cooked product, not dry. Check the total fat in packaged foods. Avoid foods that have saturated fat or trans fats. Check the ingredients list for added sugars, such as corn syrup. Shopping  At the grocery store, buy most of your food from the areas near the walls of the store. This includes: Fresh fruits and vegetables (produce). Grains, beans, nuts, and seeds. Some of these may be available in unpackaged forms or large amounts (in bulk). Fresh seafood. Poultry and eggs. Low-fat dairy products. Buy whole ingredients instead of prepackaged foods. Buy fresh fruits and vegetables in-season from local farmers markets. Buy frozen fruits and vegetables in resealable bags. If you do not have access to quality fresh seafood, buy precooked frozen shrimp or canned fish, such as tuna, salmon, or sardines. Buy  small amounts of raw or cooked vegetables, salads, or olives from the deli or salad bar at your store. Stock your pantry so you always have certain foods on hand, such as olive oil, canned tuna, canned tomatoes, rice, pasta, and beans. Cooking  Cook foods with extra-virgin olive oil instead of using butter or other vegetable oils. Have meat as a side dish, and have vegetables or grains as your main dish. This means having meat in small portions or adding small amounts of meat to foods like pasta or stew. Use beans or vegetables instead of meat in common dishes like chili or lasagna. Experiment with different cooking methods. Try roasting or broiling vegetables instead of steaming or sauteing them. Add frozen vegetables to soups, stews, pasta, or rice. Add nuts or seeds for added healthy fat at each meal. You can add these to yogurt, salads, or vegetable dishes. Marinate fish or vegetables using olive oil, lemon juice, garlic, and fresh herbs. Meal planning  Plan to eat 1 vegetarian meal one day each week. Try to work up to 2 vegetarian meals, if possible. Eat seafood 2 or more times a week. Have healthy snacks readily available, such as: Vegetable sticks with hummus. Greek yogurt. Fruit and nut trail mix. Eat balanced meals throughout the week. This includes: Fruit: 2-3 servings a day Vegetables: 4-5 servings a day Low-fat dairy: 2 servings a day Fish, poultry, or lean meat: 1 serving a day Beans and legumes: 2 or more servings a week  Nuts and seeds: 1-2 servings a day Whole grains: 6-8 servings a day Extra-virgin olive oil: 3-4 servings a day Limit red meat and sweets to only a few servings a month What are my food choices? Mediterranean diet Recommended Grains: Whole-grain pasta. Brown rice. Bulgar wheat. Polenta. Couscous. Whole-wheat bread. [place]. Vegetables: Artichokes. Beets. Broccoli. Cabbage. Carrots. Eggplant. Green beans. Chard. Kale. Spinach. Onions. Leeks. Peas.  Squash. Tomatoes. Peppers. Radishes. Fruits: Apples. Apricots. Avocado. Berries. Bananas. Cherries. Dates. Figs. Grapes. Lemons. Melon. Oranges. Peaches. Plums. Pomegranate. Meats and other protein foods: Beans. Almonds. Sunflower seeds. Pine nuts. Peanuts. [place]. Salmon. Scallops. Shrimp. [place]. Tilapia. Clams. Oysters. Eggs. Dairy: Low-fat milk. Cheese. Greek yogurt. Beverages: Water. Red wine. Herbal tea. Fats and oils: Extra virgin olive oil. Avocado oil. Grape seed oil. Sweets and desserts: [place] yogurt with honey. Baked apples. Poached pears. Trail mix. Seasoning and other foods: Basil. Cilantro. Coriander. Cumin. Mint. Parsley. Sage. Rosemary. Tarragon. Garlic. Oregano. Thyme. Pepper. Balsalmic vinegar. Tahini. Hummus. Tomato sauce. Olives. Mushrooms. Limit these Grains: Prepackaged pasta or rice dishes. Prepackaged cereal with added sugar. Vegetables: Deep fried potatoes (french fries). Fruits: Fruit canned in syrup. Meats and other protein foods: Beef. Pork. Lamb. Poultry with skin. Hot dogs. [place] Salines. Dairy: Ice cream. Sour cream. Whole milk. Beverages: Juice. Sugar-sweetened soft drinks. Beer. Liquor and spirits. Fats and oils: Butter. Canola oil. Vegetable oil. Beef fat (tallow). Lard. Sweets and desserts: Cookies. Cakes. Pies. Candy. Seasoning and other foods: Mayonnaise. Premade sauces and marinades. The items listed may not be a complete list. Talk with your dietitian about what dietary choices are right for you. Summary The Mediterranean diet includes both food and lifestyle choices. Eat a variety of fresh fruits and vegetables, beans, nuts, seeds, and whole grains. Limit the amount of red meat and sweets that you eat. Talk with your health care provider about whether it is safe for you to drink red wine in moderation. This means 1 glass a day for nonpregnant women and 2 glasses a day for men. A glass of wine equals 5 oz (150 mL). This information is not intended to replace advice  given to you by your health care provider. Make sure you discuss any questions you have with your health care provider. Document Released: 05/29/2016 Document Revised: 07/01/2016 Document Reviewed: 05/29/2016 Elsevier Interactive Patient Education  2017 [employer].

## 2022-10-22 DIAGNOSIS — R634 Abnormal weight loss: Secondary | ICD-10-CM | POA: Diagnosis not present

## 2022-10-22 DIAGNOSIS — F3342 Major depressive disorder, recurrent, in full remission: Secondary | ICD-10-CM | POA: Diagnosis not present

## 2022-10-22 DIAGNOSIS — R413 Other amnesia: Secondary | ICD-10-CM | POA: Diagnosis not present

## 2022-10-22 DIAGNOSIS — R300 Dysuria: Secondary | ICD-10-CM | POA: Diagnosis not present

## 2022-10-23 DIAGNOSIS — R300 Dysuria: Secondary | ICD-10-CM | POA: Diagnosis not present

## 2022-11-06 DIAGNOSIS — R350 Frequency of micturition: Secondary | ICD-10-CM | POA: Diagnosis not present

## 2022-11-10 DIAGNOSIS — R300 Dysuria: Secondary | ICD-10-CM | POA: Diagnosis not present

## 2022-11-10 DIAGNOSIS — N342 Other urethritis: Secondary | ICD-10-CM | POA: Diagnosis not present

## 2022-11-19 DIAGNOSIS — D2271 Melanocytic nevi of right lower limb, including hip: Secondary | ICD-10-CM | POA: Diagnosis not present

## 2022-11-19 DIAGNOSIS — D2272 Melanocytic nevi of left lower limb, including hip: Secondary | ICD-10-CM | POA: Diagnosis not present

## 2022-11-19 DIAGNOSIS — D485 Neoplasm of uncertain behavior of skin: Secondary | ICD-10-CM | POA: Diagnosis not present

## 2022-11-19 DIAGNOSIS — Z86008 Personal history of in-situ neoplasm of other site: Secondary | ICD-10-CM | POA: Diagnosis not present

## 2022-11-19 DIAGNOSIS — D225 Melanocytic nevi of trunk: Secondary | ICD-10-CM | POA: Diagnosis not present

## 2022-11-19 DIAGNOSIS — D2261 Melanocytic nevi of right upper limb, including shoulder: Secondary | ICD-10-CM | POA: Diagnosis not present

## 2022-11-19 DIAGNOSIS — Z85828 Personal history of other malignant neoplasm of skin: Secondary | ICD-10-CM | POA: Diagnosis not present

## 2022-11-19 DIAGNOSIS — D2230 Melanocytic nevi of unspecified part of face: Secondary | ICD-10-CM | POA: Diagnosis not present

## 2022-11-19 DIAGNOSIS — L820 Inflamed seborrheic keratosis: Secondary | ICD-10-CM | POA: Diagnosis not present

## 2022-11-19 DIAGNOSIS — Z86018 Personal history of other benign neoplasm: Secondary | ICD-10-CM | POA: Diagnosis not present

## 2023-02-09 DIAGNOSIS — Z136 Encounter for screening for cardiovascular disorders: Secondary | ICD-10-CM | POA: Diagnosis not present

## 2023-02-09 DIAGNOSIS — Z0000 Encounter for general adult medical examination without abnormal findings: Secondary | ICD-10-CM | POA: Diagnosis not present

## 2023-02-09 DIAGNOSIS — Z1331 Encounter for screening for depression: Secondary | ICD-10-CM | POA: Diagnosis not present

## 2023-02-09 DIAGNOSIS — M810 Age-related osteoporosis without current pathological fracture: Secondary | ICD-10-CM | POA: Diagnosis not present

## 2023-02-09 DIAGNOSIS — R413 Other amnesia: Secondary | ICD-10-CM | POA: Diagnosis not present

## 2023-02-09 DIAGNOSIS — N952 Postmenopausal atrophic vaginitis: Secondary | ICD-10-CM | POA: Diagnosis not present

## 2023-02-09 DIAGNOSIS — F3342 Major depressive disorder, recurrent, in full remission: Secondary | ICD-10-CM | POA: Diagnosis not present

## 2023-02-09 DIAGNOSIS — R300 Dysuria: Secondary | ICD-10-CM | POA: Diagnosis not present

## 2023-02-09 DIAGNOSIS — Z853 Personal history of malignant neoplasm of breast: Secondary | ICD-10-CM | POA: Diagnosis not present

## 2023-02-09 DIAGNOSIS — R634 Abnormal weight loss: Secondary | ICD-10-CM | POA: Diagnosis not present

## 2023-02-09 DIAGNOSIS — E7800 Pure hypercholesterolemia, unspecified: Secondary | ICD-10-CM | POA: Diagnosis not present

## 2023-02-10 ENCOUNTER — Other Ambulatory Visit: Payer: Self-pay | Admitting: Internal Medicine

## 2023-02-10 DIAGNOSIS — M810 Age-related osteoporosis without current pathological fracture: Secondary | ICD-10-CM

## 2023-02-17 ENCOUNTER — Other Ambulatory Visit: Payer: Self-pay | Admitting: Internal Medicine

## 2023-02-17 DIAGNOSIS — Z1231 Encounter for screening mammogram for malignant neoplasm of breast: Secondary | ICD-10-CM

## 2023-02-24 DIAGNOSIS — F419 Anxiety disorder, unspecified: Secondary | ICD-10-CM | POA: Diagnosis not present

## 2023-02-24 DIAGNOSIS — R634 Abnormal weight loss: Secondary | ICD-10-CM | POA: Diagnosis not present

## 2023-02-24 DIAGNOSIS — Z86010 Personal history of colonic polyps: Secondary | ICD-10-CM | POA: Diagnosis not present

## 2023-02-24 DIAGNOSIS — Z1211 Encounter for screening for malignant neoplasm of colon: Secondary | ICD-10-CM | POA: Diagnosis not present

## 2023-02-24 DIAGNOSIS — F32A Depression, unspecified: Secondary | ICD-10-CM | POA: Diagnosis not present

## 2023-04-20 ENCOUNTER — Ambulatory Visit: Payer: Medicare PPO | Admitting: Physician Assistant

## 2023-05-18 DIAGNOSIS — Z86010 Personal history of colonic polyps: Secondary | ICD-10-CM | POA: Diagnosis not present

## 2023-05-18 DIAGNOSIS — Z1211 Encounter for screening for malignant neoplasm of colon: Secondary | ICD-10-CM | POA: Diagnosis not present

## 2023-05-18 DIAGNOSIS — D125 Benign neoplasm of sigmoid colon: Secondary | ICD-10-CM | POA: Diagnosis not present

## 2023-05-18 DIAGNOSIS — K635 Polyp of colon: Secondary | ICD-10-CM | POA: Diagnosis not present

## 2023-05-18 DIAGNOSIS — Z980 Intestinal bypass and anastomosis status: Secondary | ICD-10-CM | POA: Diagnosis not present

## 2023-06-02 ENCOUNTER — Ambulatory Visit: Payer: Medicare PPO | Admitting: Physician Assistant

## 2023-06-02 ENCOUNTER — Encounter: Payer: Self-pay | Admitting: Physician Assistant

## 2023-06-02 DIAGNOSIS — Z029 Encounter for administrative examinations, unspecified: Secondary | ICD-10-CM

## 2023-07-16 DIAGNOSIS — Z85828 Personal history of other malignant neoplasm of skin: Secondary | ICD-10-CM | POA: Diagnosis not present

## 2023-07-16 DIAGNOSIS — D485 Neoplasm of uncertain behavior of skin: Secondary | ICD-10-CM | POA: Diagnosis not present

## 2023-07-16 DIAGNOSIS — L820 Inflamed seborrheic keratosis: Secondary | ICD-10-CM | POA: Diagnosis not present

## 2023-07-16 DIAGNOSIS — L814 Other melanin hyperpigmentation: Secondary | ICD-10-CM | POA: Diagnosis not present

## 2023-07-16 DIAGNOSIS — D2272 Melanocytic nevi of left lower limb, including hip: Secondary | ICD-10-CM | POA: Diagnosis not present

## 2023-07-16 DIAGNOSIS — D0471 Carcinoma in situ of skin of right lower limb, including hip: Secondary | ICD-10-CM | POA: Diagnosis not present

## 2023-07-16 DIAGNOSIS — D225 Melanocytic nevi of trunk: Secondary | ICD-10-CM | POA: Diagnosis not present

## 2023-07-16 DIAGNOSIS — Z86008 Personal history of in-situ neoplasm of other site: Secondary | ICD-10-CM | POA: Diagnosis not present

## 2023-07-16 DIAGNOSIS — D2271 Melanocytic nevi of right lower limb, including hip: Secondary | ICD-10-CM | POA: Diagnosis not present

## 2023-07-16 DIAGNOSIS — D2230 Melanocytic nevi of unspecified part of face: Secondary | ICD-10-CM | POA: Diagnosis not present

## 2023-07-16 DIAGNOSIS — Z86018 Personal history of other benign neoplasm: Secondary | ICD-10-CM | POA: Diagnosis not present

## 2023-07-16 DIAGNOSIS — D2261 Melanocytic nevi of right upper limb, including shoulder: Secondary | ICD-10-CM | POA: Diagnosis not present

## 2023-08-10 DIAGNOSIS — F0390 Unspecified dementia without behavioral disturbance: Secondary | ICD-10-CM | POA: Diagnosis not present

## 2023-08-10 DIAGNOSIS — F322 Major depressive disorder, single episode, severe without psychotic features: Secondary | ICD-10-CM | POA: Diagnosis not present

## 2023-08-10 DIAGNOSIS — N342 Other urethritis: Secondary | ICD-10-CM | POA: Diagnosis not present

## 2023-08-10 DIAGNOSIS — N9489 Other specified conditions associated with female genital organs and menstrual cycle: Secondary | ICD-10-CM | POA: Diagnosis not present

## 2023-08-10 DIAGNOSIS — R634 Abnormal weight loss: Secondary | ICD-10-CM | POA: Diagnosis not present

## 2023-09-28 ENCOUNTER — Inpatient Hospital Stay: Admission: RE | Admit: 2023-09-28 | Payer: Medicare PPO | Source: Ambulatory Visit

## 2023-09-28 ENCOUNTER — Ambulatory Visit: Payer: Medicare PPO

## 2023-09-28 ENCOUNTER — Other Ambulatory Visit: Payer: Self-pay | Admitting: Internal Medicine

## 2023-09-28 DIAGNOSIS — M810 Age-related osteoporosis without current pathological fracture: Secondary | ICD-10-CM

## 2023-10-22 ENCOUNTER — Encounter: Payer: Medicare PPO | Admitting: Psychology

## 2023-10-28 ENCOUNTER — Encounter: Payer: Medicare PPO | Admitting: Psychology

## 2023-11-25 ENCOUNTER — Ambulatory Visit: Payer: Medicare PPO

## 2023-12-03 ENCOUNTER — Ambulatory Visit: Payer: Medicare PPO

## 2023-12-10 ENCOUNTER — Ambulatory Visit: Payer: Medicare PPO

## 2023-12-22 ENCOUNTER — Ambulatory Visit
Admission: RE | Admit: 2023-12-22 | Discharge: 2023-12-22 | Disposition: A | Discharge status: Home / Self Care | Payer: Medicare PPO | Source: Ambulatory Visit | Attending: Internal Medicine | Admitting: Internal Medicine

## 2023-12-22 DIAGNOSIS — Z1231 Encounter for screening mammogram for malignant neoplasm of breast: Secondary | ICD-10-CM

## 2024-04-01 DIAGNOSIS — M810 Age-related osteoporosis without current pathological fracture: Secondary | ICD-10-CM | POA: Diagnosis not present

## 2024-04-01 DIAGNOSIS — Z79899 Other long term (current) drug therapy: Secondary | ICD-10-CM | POA: Diagnosis not present

## 2024-04-01 DIAGNOSIS — Z1331 Encounter for screening for depression: Secondary | ICD-10-CM | POA: Diagnosis not present

## 2024-04-01 DIAGNOSIS — F322 Major depressive disorder, single episode, severe without psychotic features: Secondary | ICD-10-CM | POA: Diagnosis not present

## 2024-04-01 DIAGNOSIS — F3342 Major depressive disorder, recurrent, in full remission: Secondary | ICD-10-CM | POA: Diagnosis not present

## 2024-04-01 DIAGNOSIS — N9489 Other specified conditions associated with female genital organs and menstrual cycle: Secondary | ICD-10-CM | POA: Diagnosis not present

## 2024-04-01 DIAGNOSIS — G300 Alzheimer's disease with early onset: Secondary | ICD-10-CM | POA: Diagnosis not present

## 2024-04-01 DIAGNOSIS — E7800 Pure hypercholesterolemia, unspecified: Secondary | ICD-10-CM | POA: Diagnosis not present

## 2024-04-01 DIAGNOSIS — Z0000 Encounter for general adult medical examination without abnormal findings: Secondary | ICD-10-CM | POA: Diagnosis not present

## 2024-04-01 DIAGNOSIS — Z23 Encounter for immunization: Secondary | ICD-10-CM | POA: Diagnosis not present

## 2024-04-01 DIAGNOSIS — Z853 Personal history of malignant neoplasm of breast: Secondary | ICD-10-CM | POA: Diagnosis not present

## 2024-04-01 DIAGNOSIS — F0390 Unspecified dementia without behavioral disturbance: Secondary | ICD-10-CM | POA: Diagnosis not present

## 2024-04-01 DIAGNOSIS — F0280 Dementia in other diseases classified elsewhere without behavioral disturbance: Secondary | ICD-10-CM | POA: Diagnosis not present

## 2024-04-03 ENCOUNTER — Other Ambulatory Visit: Payer: Self-pay | Admitting: Internal Medicine

## 2024-04-03 DIAGNOSIS — M810 Age-related osteoporosis without current pathological fracture: Secondary | ICD-10-CM

## 2024-05-12 ENCOUNTER — Ambulatory Visit: Admitting: Physician Assistant

## 2024-05-18 ENCOUNTER — Other Ambulatory Visit: Payer: Medicare PPO

## 2024-11-08 NOTE — Progress Notes (Incomplete)
 "   Dementia likely due to Alzheimer's disease   Michelle Martinez is a very pleasant 74 y.o. 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  Patient is currently on rivastigmine patch 4.6 mg 3 times daily by PCP.***.   Memory decline is noted***. MMSE today is  /30. Patient is able to participate on ADLs and continues to drive without difficulties. Mood is anxious*** . This patient is accompanied in the office by *** who supplements the history.  Previous records as well as any outside records available were reviewed prior to todays visit.   Follow up in  months Continue rivastigmine patch by PCP, recommend increasing it to 9.5 mg TD daily. Start memantine 5 mg twice daily, side effects discussed Recommend good control of cardiovascular risk factors.   Continue to control mood as per PCP, she is on duloxetine  60 mg daily.***Recommend psychotherapy for situational anxiety and depression   Discussed the use of AI scribe software for clinical note transcription with the patient, who gave verbal consent to proceed.  History of Present Illness     Any changes in memory since last visit? 'Now I am aware and anxious because of the memory difficulties: I am socially active and always have been extroverted. I am able to recall recent conversations and people names repeats oneself?  Endorsed by sons Disoriented when walking into a room?  Patient denies except occasionally not remembering what patient came to the room for    Leaving objects in unusual places?  Patient denies   Wandering behavior?   denies   Any personality changes since last visit?  She reports a history of situational anxiety, and has not had any recent psychotherapy. Any worsening depression?:  Endorsed.  I try to avoid conflict  Hallucinations or paranoia?  denies   Seizures?   denies    Any sleep  changes?  Denies  vivid dreams, REM behavior or sleepwalking  Sleeps in a medical recliner for comfort, without it she would have more sleeping difficulties Sleep apnea?   denies   Any hygiene concerns? denies   Independent of bathing and dressing?  Endorsed  Does the patient needs help with medications? Patient is in charge Who is in charge of the finances?  Patient is in charge Any changes in appetite?  For the last year, she reports a 13 pound weight loss of unknown etiology Patient have trouble swallowing?  denies   Does the patient cook?  Any kitchen accidents such as leaving the stove on?   denies   Any headaches?    denies   Vision changes? denies Chronic back pain  denies   Ambulates with difficulty?    denies   Recent falls or head injuries?    denies     Unilateral weakness, numbness or tingling?   denies   Any tremors?  denies   Any anosmia?    denies   Any incontinence of urine?  denies   Any bowel dysfunction?  denies      Patient lives alone Does the patient drive?  No issues, but I have always been terrible with directions.     Neuropsychological evaluation 01/10/2022 briefly, results suggested severe impairment surrounding all aspects of learning and memory. Additional weakness and/or performance variability was exhibited across executive functioning and semantic fluency. While the current etiology is unclear at the present time, consideration unfortunately  needs to be given for the early stages of Alzheimer's disease. Across memory testing, Michelle Martinez did not benefit from repeated exposure to information across learning trials. After brief delays, she was essentially amnestic (i.e., 0% to 17%) across all three memory tasks and performed poorly across yes/no recognition trials. Taken together, this suggests the presence of rapid forgetting and a significant memory storage deficit, both of which are the hallmark memory patterns in Alzheimer's disease.     Initial visit  05/13/2021 the patient is seen in neurologic consultation at the request of Michelle Charleston, MD for the evaluation of memory. The patient is here alone.  she is a 74 y.o. year old female who has had memory issues for about 5 or 6 years, at which time she was taking care of her dying husband to ALS.  She was seen by Dr. Ines from [facility] neurological, and placed initially on Aricept.  In the interim, her husband died, she had multiple procedures, including discectomy, lumpectomy and colostomy, all these procedures without malignancy.  Her PCP noticed some changes in her memory, and felt that she needed to have a new neurological evaluation.  The patient may have been on rivastigmine patch at some point, although she states that she ran out of it.  She states that she was taking the patch because she was forgetting to take her doses.  She lives alone, with 2 sons nearby, and good neighbors.  She is a retired interior and spatial designer at [employer].  Overall, she tries to stay active, helping one of her sons in the restaurant, but she has been feeling more depressed, especially when she is trying to remember and the memory failed .  It does not help with my friends asked me what did you say? or what you were trying to say? .  She denies irritability.  She sleeps well, denying vivid dreams or sleepwalking.  Denies hallucinations or paranoia.  She does not need assistance with bathing and dressing.  As mentioned above, she forgets at times to take her medications, because she does not like them.  At times, she forgets to pay bills.  She had one incident recently, which made her aware of her memory loss.  There is an application called [employer], which helps her to play memory games.  She kept playing, but she forgot to pay the [employer] bill, almost leading her to collection.  She now lives herself a note.    Denies living objects in unusual places. Appetite is good, not very healthy .  Denies trouble  swallowing.  She does not like to cook much.  Denies leaving the stove or the faucet on.  Ambulates without difficulty without the use of a cane or a walker.  She drives without getting lost and uses a GPS.  She has occasional headaches, which she attributes to caffeine, which are resolved with drinking a cup of coffee (she drinks 2 coffees a day and of parenthesis.  She denies any falls or injuries to the head, double vision, dizziness, focal numbness or tingling, unilateral weakness or tremors.  She denies urine incontinence or retention, constipation or diarrhea.  Family history remarkable for Lewy body dementia in her father.  She denies a history of OSA, alcohol, or tobacco.  She has 2 children, has a college degree.            07/20/2019    8:02 AM  MMSE - Mini Mental State Exam  Orientation to time 5  Orientation to Place 4  Registration 3  Attention/ Calculation 2  Recall 1  Language- name 2 objects 2  Language- repeat 1  Language- follow 3 step command 3  Language- read & follow direction 1  Write a sentence 1  Copy design 1  Total score 24      05/13/2021    2:00 PM  Montreal Cognitive Assessment   Visuospatial/ Executive (0/5) 2  Naming (0/3) 3  Attention: Read list of digits (0/2) 2  Attention: Read list of letters (0/1) 1  Attention: Serial 7 subtraction starting at 100 (0/3) 2  Language: Repeat phrase (0/2) 1  Language : Fluency (0/1) 1  Abstraction (0/2) 2  Delayed Recall (0/5) 0  Orientation (0/6) 4  Total 18  Adjusted Score (based on education) 18      Objective:    Neurological Exam:    VITALS:  There were no vitals filed for this visit.  GEN:  The patient appears stated age and is in NAD. HEENT:  Normocephalic, atraumatic.   Neurological examination:  General: NAD, well-groomed, appears stated age. Orientation: The patient is alert. Oriented to person, place and not to date Cranial nerves: There is good facial symmetry.The speech is fluent and  clear. No aphasia or dysarthria. Fund of knowledge is appropriate. Recent and remote memory are impaired. Attention and concentration are reduced. Able to name objects and repeat phrases.  Hearing is intact to conversational tone. *** Sensation: Sensation is intact to light touch throughout Motor: Strength is at least antigravity x4. DTR's 2/4 in UE/LE     Movement examination:  Tone: There is normal tone in the UE/LE Abnormal movements:  no tremor.  No myoclonus.  No asterixis.   Coordination:  There is no decremation with RAM's. Normal finger to nose  Gait and Station: The patient has no*** difficulty arising out of a deep-seated chair without the use of the hands. The patient's stride length is good.  Gait is cautious and narrow.    Thank you for allowing us  the opportunity to participate in the care of this nice patient. Please do not hesitate to contact us  for any questions or concerns.   Total time spent on today's visit was *** minutes dedicated to this patient today, preparing to see patient, examining the patient, ordering tests and/or medications and counseling the patient, documenting clinical information in the EHR or other health record, independently interpreting results and communicating results to the patient/family, discussing treatment and goals, answering patient's questions and coordinating care.  Cc:  Michelle Charleston, MD (Inactive)  Camie Sevin 11/08/2024 6:03 AM      "

## 2024-11-09 ENCOUNTER — Ambulatory Visit: Admitting: Physician Assistant

## 2024-11-09 ENCOUNTER — Encounter: Payer: Self-pay | Admitting: Physician Assistant

## 2024-12-26 ENCOUNTER — Ambulatory Visit (HOSPITAL_BASED_OUTPATIENT_CLINIC_OR_DEPARTMENT_OTHER)
